# Patient Record
Sex: Male | Born: 1970 | Race: White | Hispanic: No | Marital: Married | Smoking: Former smoker
Health system: Southern US, Community
[De-identification: ages and names within clinical notes are randomized; demographics above are authoritative.]

## PROBLEM LIST (undated history)

## (undated) DIAGNOSIS — M545 Low back pain, unspecified: Secondary | ICD-10-CM

## (undated) DIAGNOSIS — K219 Gastro-esophageal reflux disease without esophagitis: Secondary | ICD-10-CM

## (undated) HISTORY — DX: Low back pain, unspecified: M54.50

## (undated) HISTORY — DX: Low back pain: M54.5

## (undated) HISTORY — PX: OTHER SURGICAL HISTORY: SHX169

## (undated) HISTORY — DX: Gastro-esophageal reflux disease without esophagitis: K21.9

---

## 2009-02-24 ENCOUNTER — Encounter (INDEPENDENT_AMBULATORY_CARE_PROVIDER_SITE_OTHER): Payer: Self-pay | Admitting: *Deleted

## 2009-03-07 ENCOUNTER — Ambulatory Visit: Payer: Self-pay | Admitting: Internal Medicine

## 2009-03-07 DIAGNOSIS — K219 Gastro-esophageal reflux disease without esophagitis: Secondary | ICD-10-CM | POA: Insufficient documentation

## 2009-03-07 DIAGNOSIS — E119 Type 2 diabetes mellitus without complications: Secondary | ICD-10-CM

## 2009-03-07 DIAGNOSIS — M545 Low back pain: Secondary | ICD-10-CM

## 2009-03-07 HISTORY — DX: Type 2 diabetes mellitus without complications: E11.9

## 2009-03-07 HISTORY — DX: Gastro-esophageal reflux disease without esophagitis: K21.9

## 2009-03-07 LAB — CONVERTED CEMR LAB
Blood Glucose, Fasting: 208 mg/dL
Glucose, Urine, Semiquant: >=1000
Nitrite: NEGATIVE
Protein, U semiquant: NEGATIVE
Specific Gravity, Urine: 1.02
Urobilinogen, UA: 0.2
WBC Urine, dipstick: NEGATIVE
WBC, UA: NONE SEEN cells/hpf (ref <3)
pH: 6.5

## 2009-03-08 ENCOUNTER — Encounter: Payer: Self-pay | Admitting: Internal Medicine

## 2009-03-13 ENCOUNTER — Telehealth (INDEPENDENT_AMBULATORY_CARE_PROVIDER_SITE_OTHER): Payer: Self-pay | Admitting: *Deleted

## 2009-03-14 LAB — CONVERTED CEMR LAB
Albumin: 4.5 g/dL (ref 3.5–5.2)
Basophils Relative: 0.7 % (ref 0.0–3.0)
CO2: 30 meq/L (ref 19–32)
Chloride: 103 meq/L (ref 96–112)
Eosinophils Absolute: 0.1 10*3/uL (ref 0.0–0.7)
Eosinophils Relative: 1.3 % (ref 0.0–5.0)
Hemoglobin: 15.9 g/dL (ref 13.0–17.0)
Hgb A1c MFr Bld: 10.1 % — ABNORMAL HIGH (ref 4.6–6.5)
MCHC: 35 g/dL (ref 30.0–36.0)
MCV: 88.3 fL (ref 78.0–100.0)
Monocytes Absolute: 0.5 10*3/uL (ref 0.1–1.0)
Neutro Abs: 5.7 10*3/uL (ref 1.4–7.7)
Potassium: 4.2 meq/L (ref 3.5–5.1)
RBC: 5.13 M/uL (ref 4.22–5.81)
Sodium: 138 meq/L (ref 135–145)
Total Protein: 7.1 g/dL (ref 6.0–8.3)
WBC: 8.3 10*3/uL (ref 4.5–10.5)

## 2009-04-18 ENCOUNTER — Ambulatory Visit: Payer: Self-pay | Admitting: Internal Medicine

## 2009-04-21 LAB — CONVERTED CEMR LAB
ALT: 27 units/L (ref 0–53)
AST: 19 units/L (ref 0–37)
Albumin: 4.3 g/dL (ref 3.5–5.2)
Alkaline Phosphatase: 46 units/L (ref 39–117)
Total Protein: 7.1 g/dL (ref 6.0–8.3)

## 2009-05-31 ENCOUNTER — Ambulatory Visit: Payer: Self-pay | Admitting: Internal Medicine

## 2009-06-05 LAB — CONVERTED CEMR LAB
BUN: 11 mg/dL (ref 6–23)
CO2: 28 meq/L (ref 19–32)
Calcium: 9.3 mg/dL (ref 8.4–10.5)
Creatinine, Ser: 0.8 mg/dL (ref 0.4–1.5)
Glucose, Bld: 182 mg/dL — ABNORMAL HIGH (ref 70–99)

## 2009-08-15 ENCOUNTER — Ambulatory Visit: Payer: Self-pay | Admitting: Internal Medicine

## 2009-08-24 LAB — CONVERTED CEMR LAB
AST: 18 units/L (ref 0–37)
Direct LDL: 95 mg/dL
Hgb A1c MFr Bld: 7.1 % — ABNORMAL HIGH (ref 4.6–6.5)

## 2009-09-14 ENCOUNTER — Encounter: Payer: Self-pay | Admitting: Internal Medicine

## 2009-09-14 LAB — HM DIABETES EYE EXAM

## 2009-09-22 ENCOUNTER — Encounter: Payer: Self-pay | Admitting: Internal Medicine

## 2009-10-12 ENCOUNTER — Telehealth (INDEPENDENT_AMBULATORY_CARE_PROVIDER_SITE_OTHER): Payer: Self-pay | Admitting: *Deleted

## 2009-10-16 ENCOUNTER — Ambulatory Visit: Payer: Self-pay | Admitting: Family

## 2009-10-27 ENCOUNTER — Encounter: Admission: RE | Admit: 2009-10-27 | Discharge: 2009-10-27 | Payer: Self-pay | Admitting: Orthopedic Surgery

## 2009-11-15 ENCOUNTER — Ambulatory Visit: Payer: Self-pay | Admitting: Internal Medicine

## 2009-11-15 LAB — CONVERTED CEMR LAB: Blood Glucose, Fingerstick: 207

## 2009-11-17 LAB — CONVERTED CEMR LAB
BUN: 9 mg/dL (ref 6–23)
CO2: 29 meq/L (ref 19–32)
Chloride: 104 meq/L (ref 96–112)
Creatinine, Ser: 0.7 mg/dL (ref 0.4–1.5)
Potassium: 4.2 meq/L (ref 3.5–5.1)

## 2010-02-13 ENCOUNTER — Ambulatory Visit: Payer: Self-pay | Admitting: Internal Medicine

## 2010-02-13 LAB — CONVERTED CEMR LAB
HDL: 38.2 mg/dL — ABNORMAL LOW (ref >39.00)
Hgb A1c MFr Bld: 6.2 % (ref 4.6–6.5)
Total CHOL/HDL Ratio: 4
Triglycerides: 184 mg/dL — ABNORMAL HIGH (ref 0.0–149.0)
VLDL: 36.8 mg/dL (ref 0.0–40.0)

## 2010-02-13 LAB — HM DIABETES FOOT EXAM

## 2010-02-14 ENCOUNTER — Telehealth: Payer: Self-pay | Admitting: Internal Medicine

## 2010-03-22 ENCOUNTER — Telehealth (INDEPENDENT_AMBULATORY_CARE_PROVIDER_SITE_OTHER): Payer: Self-pay | Admitting: *Deleted

## 2010-04-05 ENCOUNTER — Telehealth: Payer: Self-pay | Admitting: Internal Medicine

## 2010-07-31 ENCOUNTER — Telehealth (INDEPENDENT_AMBULATORY_CARE_PROVIDER_SITE_OTHER): Payer: Self-pay | Admitting: *Deleted

## 2010-08-08 ENCOUNTER — Ambulatory Visit: Payer: Self-pay | Admitting: Internal Medicine

## 2010-08-09 LAB — CONVERTED CEMR LAB
AST: 25 units/L (ref 0–37)
Albumin: 4.4 g/dL (ref 3.5–5.2)
Alkaline Phosphatase: 44 units/L (ref 39–117)
BUN: 11 mg/dL (ref 6–23)
CO2: 30 meq/L (ref 19–32)
GFR calc non Af Amer: 111.61 mL/min (ref >60)
Glucose, Bld: 122 mg/dL — ABNORMAL HIGH (ref 70–99)
Potassium: 4.1 meq/L (ref 3.5–5.1)

## 2010-09-10 ENCOUNTER — Telehealth: Payer: Self-pay | Admitting: Internal Medicine

## 2010-09-19 ENCOUNTER — Ambulatory Visit: Payer: Self-pay | Admitting: Internal Medicine

## 2010-09-21 LAB — CONVERTED CEMR LAB: Hgb A1c MFr Bld: 7.2 % — ABNORMAL HIGH (ref 4.6–6.5)

## 2010-10-12 ENCOUNTER — Ambulatory Visit
Admission: RE | Admit: 2010-10-12 | Discharge: 2010-10-12 | Payer: Self-pay | Source: Home / Self Care | Attending: Internal Medicine | Admitting: Internal Medicine

## 2010-10-12 DIAGNOSIS — K644 Residual hemorrhoidal skin tags: Secondary | ICD-10-CM

## 2010-10-12 HISTORY — DX: Residual hemorrhoidal skin tags: K64.4

## 2010-11-06 NOTE — Assessment & Plan Note (Signed)
Summary: 3 mth fu/ns/kdc      Allergies: No Known Drug Allergies   Complete Medication List: 1)  Ranitidine Hcl 150 Mg Tabs (Ranitidine hcl) .... Two times a day 2)  One Touch Ultra 2 Test Strips  .... Checks bs 1x/day dx 250.00 3)  Metformin Hcl 1000 Mg Tabs (Metformin hcl) .Marland Kitchen.. 1 by mouth two times a day

## 2010-11-06 NOTE — Letter (Signed)
Summary: Cancer Screening/[facility] Personalized Risk Profile  Cancer Screening/[facility] Personalized Risk Profile   Imported By: Lanelle Bal 02/16/2010 14:13:56  _____________________________________________________________________  External Attachment:    Type:   Image     Comment:   External Document

## 2010-11-06 NOTE — Assessment & Plan Note (Signed)
Summary: 3 mth fu/ns/kdc  Lab Visit  Laboratory Results   Blood Tests     CBG Random:: 207mg /dL    Orders Today: Fingerstick [36416] Glucose, (CBG) [82962]  Vital Signs:  Patient Profile:   40 Years Old Male CC:      rov - had coffee with splenda, cream Height:     69 inches Weight:      222.6 pounds Pulse rate:   60 / minute Pulse rhythm:   regular BP sitting:   112 / 80  (left arm) Cuff size:   large  Vitals Entered By: Shary Decamp (November 15, 2009 9:47 AM)             Is Patient Diabetic? Yes  CBG Result 207 Comments  - pt states that blood sugar has been elevated lately - states that "I fell off the bandwagon around the holidays" but states he is starting to eat better Shary Decamp  November 15, 2009 9:56 AM     Appended Document: 3 mth fu/ns/kdc     History of Present Illness: feels well diet has not been healthy for the last few weeks but he is getting back on track  Allergies: No Known Drug Allergies  Review of Systems       no nausea or vomiting occ. has  diarrhea CBGs are mostly 130 to 150, today his sugar is  200 no low blood sugar symptoms  Physical Exam  General:  alert and well-developed.   Lungs:  normal respiratory effort, no intercostal retractions, no accessory muscle use, and normal breath sounds.   Heart:  normal rate, regular rhythm, and no murmur.     Impression & Recommendations:  Problem # 1:  DIABETES MELLITUS, TYPE II (ICD-250.00) tolerating metformin well with only sporadic diarrhea encouraged to go back to a healthier diet labs His updated medication list for this problem includes:    Metformin Hcl 1000 Mg Tabs (Metformin hcl) .Marland Kitchen... 1 by mouth two times a day  Orders: Venipuncture (16109) TLB-ALT (SGPT) (84460-ALT) TLB-AST (SGOT) (84450-SGOT) TLB-BMP (Basic Metabolic Panel-BMET) (80048-METABOL) TLB-A1C / Hgb A1C (Glycohemoglobin) (83036-A1C)  Complete Medication List: 1)  Ranitidine Hcl 150 Mg Tabs  (Ranitidine hcl) .... Two times a day 2)  One Touch Ultra 2 Test Strips  .... Checks bs 1x/day dx 250.00 3)  Metformin Hcl 1000 Mg Tabs (Metformin hcl) .Marland Kitchen.. 1 by mouth two times a day  Patient Instructions: 1)  Please schedule a follow-up appointment in 3 months --fasting, physical

## 2010-11-06 NOTE — Progress Notes (Signed)
  Phone Note Outgoing Call   Summary of Call: advise patient: diabetes is well controlled cholesterol is fine, the triglycerides continue to be slightly elevated but better than before Plan: Consolidate he's diabetes medications to ACTOPLUS MET XR 15-1000 MG 1 by mouth two times a day, explaine  patient that this is extended release but we can take it twice a day and see how that works for him. continue with a healthier lifestyle and follow-up as planned Jose E. Paz MD  Feb 14, 2010 2:10 PM   Follow-up for Phone Call        discussed with pt......Marland KitchenShary Decamp  Feb 15, 2010 11:28 AM     New/Updated Medications: ACTOPLUS MET XR 15-1000 MG XR24H-TAB (PIOGLITAZONE HCL-METFORMIN HCL) one by mouth twice a day Prescriptions: ACTOPLUS MET XR 15-1000 MG XR24H-TAB (PIOGLITAZONE HCL-METFORMIN HCL) one by mouth twice a day  #60 x 3   Entered by:   Shary Decamp   Authorized by:   Nolon Rod. Paz MD   Signed by:   Shary Decamp on 02/15/2010   Method used:   Electronically to        [facility] [phone]* (retail)       [address] [place], [place]  [address]       Ph: [phone]       Fax: [phone]   RxID:   [id number]

## 2010-11-06 NOTE — Progress Notes (Signed)
Summary: call PHARMACYrefill  Phone Note Refill Request Message from:  Patient on March 22, 2010 7:58 AM  Refills Requested: Medication #1:  ACTOPLUS MET XR 15-1000 MG XR24H-TAB one by mouth twice a day. [facility] [address] [facility]ne [facility]- fax [phone] - phone [phone]  Initial call taken by: Carol Spring,  March 22, 2010 7:58 AM  Follow-up for Phone Call        rx sent on 02-15-10 #60 3 will call pharmacy when open to confirm rx received............Marland KitchenFelecia Deloach CMA  March 22, 2010 8:13 AM  spoke to pharmacy rx on file disregard request.............Marland KitchenFelecia Deloach CMA  March 22, 2010 12:48 PM

## 2010-11-06 NOTE — Progress Notes (Signed)
Summary: ACTOS(lmom 7/1)  Phone Note Call from Patient   Caller: Patient Summary of Call: PT CALLED AND WANTS TO HAVE THE ORIGINAL RX FOR ACTOS FILLED. HIS INSURANCE WILL NOT PAY FOR ACTOPLUS. PLEASE ADVISE. Initial call taken by: Lavell Islam,  April 05, 2010 2:48 PM  Follow-up for Phone Call        new prescription --Actos 30 mg  once daily  ( if patient prefers Actos 15mg  twice a day, his insurance may not pay for that) --Metformin 1000 mg twice a day Preston Weill E. Mena Lienau MD  April 06, 2010 7:54 AM   Additional Follow-up for Phone Call Additional follow up Details #1::        Left message for pt to call back. Army Fossa CMA  April 06, 2010 8:32 AM     New/Updated Medications: ACTOS 30 MG TABS (PIOGLITAZONE HCL) 1 by mouth once daily. METFORMIN HCL 1000 MG TABS (METFORMIN HCL) 1 by mouth two times a day. Prescriptions: METFORMIN HCL 1000 MG TABS (METFORMIN HCL) 1 by mouth two times a day.  #60 x 1   Entered by:   Army Fossa CMA   Authorized by:   Nolon Rod. Charnise Lovan MD   Signed by:   Army Fossa CMA on 04/06/2010   Method used:   Electronically to        [facility] [phone]* (retail)       [address] [place], [place]  [address]       Ph: [phone]       Fax: [phone]   RxID:   [id number] ACTOS 30 MG TABS (PIOGLITAZONE HCL) 1 by mouth once daily.  #30 x 1   Entered by:   Army Fossa CMA   Authorized by:   Nolon Rod. Dameon Soltis MD   Signed by:   Army Fossa CMA on 04/06/2010   Method used:   Electronically to        [facility] [phone]* (retail)       [address] [place], [place]  [address]       Ph: [phone]       Fax: [phone]   RxID:   [id number]

## 2010-11-06 NOTE — Assessment & Plan Note (Signed)
Summary: pain right elbow/tendonitis?/alr   Vital Signs:  Patient profile:   40 year old male Weight:      222 pounds Pulse rate:   70 / minute BP sitting:   130 / 76  (left arm)  Vitals Entered By: Doristine Devoid (October 16, 2009 9:53 AM) CC: pain in R elbow now discomfort in L Comments -has been using brace w/ some relief    CC:  pain in R elbow now discomfort in L.  History of Present Illness: Mr Sobol is a 40 year old male who presents today with c/o pain in right elbow x several months.  Has used ibuprofen and braces without significant improvement.  Works on a [employer]- notes that symptoms worsen during the week.  Better on the weekends.  Describes pain as a dull ache.   Now starting to have pain in the left elbow- but not as severe as the right.  Patient is right handed.  Allergies: No Known Drug Allergies  Review of Systems       Denies fever, associated elbow redness, notes mild Right elbow swelling.    Physical Exam  General:  Well-developed,well-nourished,in no acute distress; alert,appropriate and cooperative throughout examination Msk:  No swelling or erythema noted over olecranon processes bilaterally. No nodules. + bilateral flexion and extension. Psych:  Oriented X3, normally interactive, and good eye contact.     Impression & Recommendations:  Problem # 1:  OLECRANON BURSITIS (ICD-726.33) Assessment New Patient has tried braces and NAIDS without improvement.  Will refer to orthopedics for further evaluation. Continue as needed ibuprofen Orders: Orthopedic Referral (Ortho)  Complete Medication List: 1)  Ranitidine Hcl 150 Mg Tabs (Ranitidine hcl) .... Two times a day 2)  One Touch Ultra 2 Test Strips  .... Checks bs 1x/day dx 250.00 3)  Metformin Hcl 1000 Mg Tabs (Metformin hcl) .Marland Kitchen.. 1 by mouth two times a day  Patient Instructions: 1)  Take 400-600mg  of Ibuprofen (Advil, Motrin) with food every 4-6 hours as needed for relief of pain or comfort of  fever. 2)  You will be contacted about your appointment with orthopedics

## 2010-11-06 NOTE — Assessment & Plan Note (Signed)
Summary: CPX/KDC   Vital Signs:  Patient profile:   40 year old male Height:      70.5 inches Weight:      222.6 pounds BMI:     31.60 Pulse rate:   57 / minute Pulse rhythm:   regular BP sitting:   137 / 88  Vitals Entered By: Shary Decamp (Feb 13, 2010 8:27 AM) CC: cpx, fasting, FBS 80-105 Is Patient Diabetic? Yes   History of Present Illness: CPX feels well  Preventive Screening-Counseling & Management  Alcohol-Tobacco     Year Quit: 2001  Caffeine-Diet-Exercise     Caffeine use/day: 0     Type of exercise: pt works in garden  Current Medications (verified): 1)  Ranitidine Hcl 150 Mg Tabs (Ranitidine Hcl) .... Two Times A Day 2)  One Touch Ultra 2 Test Strips .... Checks Bs 1x/day Dx 250.00 3)  Metformin Hcl 1000 Mg Tabs (Metformin Hcl) .Marland Kitchen.. 1 By Mouth Two Times A Day 4)  Actos 30 Mg Tabs (Pioglitazone Hcl) .Marland Kitchen.. 1 By Mouth Once Daily - Due Office Visit 02/2010 5)  Allegra 180 Mg Tabs (Fexofenadine Hcl) .Marland Kitchen.. 1 Daily  Allergies (verified): No Known Drug Allergies  Past History:  Past Medical History: Reviewed history from 05/31/2009 and no changes required. GERD Low back pain (sees chiro [facility] Picket) Diabetes mellitus, type II  Dx 03-2009  Past Surgical History: Reviewed history from 03/07/2009 and no changes required. 2007 - abscess anal gland  Family History: Reviewed history from 03/07/2009 and no changes required. DM - F, Bro High chol - F HTN - F stroke - no colon Ca - No (M had colon polyps) prostate Ca - no CAD - no  Social History: Married no kids Former Smoker quit 2000 Alcohol use-yes- glass of wine occasionally Drug use-no Regular exercise-yes (wii fit, walks a lot @ work, works outside on weekends) diet-- trying to eat healthy Caffeine use/day:  0  Review of Systems General:  Denies fatigue and weight loss. CV:  Denies chest pain or discomfort and swelling of feet. Resp:  Denies cough and shortness of breath. GI:  Denies bloody  stools, nausea, and vomiting. GU:  Denies hematuria, urinary frequency, and urinary hesitancy. Endo:  ambulatory CBGs fasting 80 to 105 .  Physical Exam  General:  alert, well-developed, and well-nourished.   Neck:  no thyromegaly.   Lungs:  normal respiratory effort, no intercostal retractions, no accessory muscle use, and normal breath sounds.   Heart:  normal rate, regular rhythm, and no murmur.   Abdomen:  soft, non-tender, no distention, no masses, no guarding, and no rigidity.   Pulses:  normal pedal pulses bilaterally  Extremities:  no pretibial edema bilaterally  Psych:  Cognition and judgment appear intact. Alert and cooperative with normal attention span and concentration.  not anxious appearing and not depressed appearing.    Diabetes Management Exam:    Foot Exam (with socks and/or shoes not present):       Sensory-Pinprick/Light touch:          Left medial foot (L-4): normal          Left dorsal foot (L-5): normal          Left lateral foot (S-1): normal          Right medial foot (L-4): normal          Right dorsal foot (L-5): normal          Right lateral foot (S-1): normal  Sensory-Monofilament:          Left foot: normal          Right foot: normal       Inspection:          Left foot: normal          Right foot: normal       Nails:          Left foot: normal          Right foot: normal   Impression & Recommendations:  Problem # 1:  ROUTINE GENERAL MEDICAL EXAM@HEALTH  CARE FACL (ICD-V70.0)  Td 08 sees his dentist routinely labs discuss diet and exercise  Orders: Venipuncture (82956) TLB-Lipid Panel (80061-LIPID)  Problem # 2:  DIABETES MELLITUS, TYPE II (ICD-250.00)  seems to be doing well does have his eyes checked routinely if A1c good, will consolidate metformin and Actos in a single tablet His updated medication list for this problem includes:    Metformin Hcl 1000 Mg Tabs (Metformin hcl) .Marland Kitchen... 1 by mouth two times a day    Actos 30 Mg  Tabs (Pioglitazone hcl) .Marland Kitchen... 1 by mouth once daily - due office visit 02/2010  Orders: TLB-A1C / Hgb A1C (Glycohemoglobin) (83036-A1C) TLB-Microalbumin/Creat Ratio, Urine (82043-MALB)  Complete Medication List: 1)  Ranitidine Hcl 150 Mg Tabs (Ranitidine hcl) .... Two times a day 2)  One Touch Ultra 2 Test Strips  .... Checks bs 1x/day dx 250.00 3)  Metformin Hcl 1000 Mg Tabs (Metformin hcl) .Marland Kitchen.. 1 by mouth two times a day 4)  Actos 30 Mg Tabs (Pioglitazone hcl) .Marland Kitchen.. 1 by mouth once daily - due office visit 02/2010 5)  Allegra 180 Mg Tabs (Fexofenadine hcl) .Marland Kitchen.. 1 daily  Patient Instructions: 1)  Please schedule a follow-up appointment in 4 to 6 months depending on lab results   Preventive Care Screening  Prior Values:    Last Tetanus Booster:  Historical (10/07/2006)    Risk Factors:  Tobacco use:  quit    Year quit:  2001 Drug use:  no Caffeine use:  0 drinks per day Alcohol use:  yes    Has patient --       Needed eye opener in the morning:  yes    Comments:  1x/month Exercise:  yes    Type:  pt works in garden

## 2010-11-06 NOTE — Assessment & Plan Note (Signed)
Summary: office visit with fasting labs///sph   Vital Signs:  Patient profile:   40 year old male Weight:      233.38 pounds Pulse rate:   76 / minute Pulse rhythm:   regular BP sitting:   126 / 82  (left arm) Cuff size:   large  Vitals Entered By: Army Fossa CMA (August 08, 2010 8:50 AM) CC: Follow up visit- fasting Comments [facility] act[facility] and [facility]   History of Present Illness: here for followup on diabetes Would like to try Byetta Uncomfortable taking Actos any longer d/t legal problems he saw in TV Metformin causes stomach discomfort and some diarrhea  Current Medications (verified): 1)  Ranitidine Hcl 150 Mg Tabs (Ranitidine Hcl) .... Two Times A Day 2)  One Touch Ultra 2 Test Strips .... Checks Bs 1x/day Dx 250.00 3)  Allegra 180 Mg Tabs (Fexofenadine Hcl) .Marland Kitchen.. 1 Daily 4)  Actos 30 Mg Tabs (Pioglitazone Hcl) .Marland Kitchen.. 1 By Mouth Once Daily. 5)  Metformin Hcl 1000 Mg Tabs (Metformin Hcl) .Marland Kitchen.. 1 By Mouth Two Times A Day. 6)  Aleve 220 Mg Tabs (Naproxen Sodium) .... As Needed  Allergies (verified): No Known Drug Allergies  Past History:  Past Medical History: Reviewed history from 05/31/2009 and no changes required. GERD Low back pain (sees chiro [facility] Picket) Diabetes mellitus, type II  Dx 03-2009  Past Surgical History: Reviewed history from 03/07/2009 and no changes required. 2007 - abscess anal gland  Social History: Reviewed history from 02/13/2010 and no changes required. Married no kids Former Smoker quit 2000 Alcohol use-yes- glass of wine occasionally Drug use-no Regular exercise-yes (wii fit, walks a lot @ work, works outside on weekends) diet-- trying to eat healthy   Review of Systems       weight gain since he started Actos No chest pain or shortness of breath  Physical Exam  General:  alert and well-developed.   Lungs:  normal respiratory effort, no intercostal retractions, no accessory muscle  use, and normal breath sounds.   Heart:  normal rate, regular rhythm, and no murmur.   Extremities:  no edema   Impression & Recommendations:  Problem # 1:  DIABETES MELLITUS, TYPE II (ICD-250.00) would like to discontinue Actos and metformin and try Byetta, see HPI I don't oppose that Information regards Byetta  provided from "up-to-date" we showed him how to use it   The following medications were removed from the medication list:    Actos 30 Mg Tabs (Pioglitazone hcl) .Marland Kitchen... 1 by mouth once daily.    Metformin Hcl 1000 Mg Tabs (Metformin hcl) .Marland Kitchen... 1 by mouth two times a day. His updated medication list for this problem includes:    Byetta 5 Mcg Pen 5 Mcg/0.29ml Soln (Exenatide) ..... One injection twice a day 30 to 60 minutes  orbefore meals  Orders: Venipuncture (01751) TLB-BMP (Basic Metabolic Panel-BMET) (80048-METABOL) TLB-Hepatic/Liver Function Pnl (80076-HEPATIC) TLB-A1C / Hgb A1C (Glycohemoglobin) (83036-A1C) Specimen Handling (02585)  Complete Medication List: 1)  Ranitidine Hcl 150 Mg Tabs (Ranitidine hcl) .... Two times a day 2)  One Touch Ultra 2 Test Strips  .... Checks bs 1x/day dx 250.00 3)  Allegra 180 Mg Tabs (Fexofenadine hcl) .Marland Kitchen.. 1 daily 4)  Aleve 220 Mg Tabs (Naproxen sodium) .... As needed 5)  Byetta 5 Mcg Pen 5 Mcg/0.44ml Soln (Exenatide) .... One injection twice a day 30 to 60 minutes  orbefore meals  Patient Instructions: 1)  start byetta  twice a  day 2)  Please read the information about this drug 3)  Watch for side effects 4)  call with your blood sugar results in 2 weeks, check sugars twice a day 5)  office visit in 6 weeks 6)  diet! 7)  Exercise! Prescriptions: BYETTA 5 MCG PEN 5 MCG/0.02ML SOLN (EXENATIDE) one injection twice a day 30 to 60 minutes  orbefore meals  #one month x 3   Entered and Authorized by:   Elita Quick E. Paz MD   Signed by:   Nolon Rod. Paz MD on 08/08/2010   Method used:   Print then Give to Patient   RxID:    [id number]    Orders Added: 1)  Venipuncture [95621] 2)  TLB-BMP (Basic Metabolic Panel-BMET) [80048-METABOL] 3)  TLB-Hepatic/Liver Function Pnl [80076-HEPATIC] 4)  TLB-A1C / Hgb A1C (Glycohemoglobin) [83036-A1C] 5)  Specimen Handling [99000] 6)  Est. Patient Level III [30865]

## 2010-11-06 NOTE — Progress Notes (Signed)
Summary: appt--LMOM 10/25--appt sched  Phone Note Outgoing Call   Call placed by: Army Fossa CMA,  July 31, 2010 8:16 AM Summary of Call: Pt is due for a fasting OV w/ Dr Drue Novel. Please schedule.   Follow-up for Phone Call        [facility] ON HOME NUMBER TO CALL AND SCHEDULE A FASTING OFFICE VISIT WITH DR PAZ Follow-up by: Jerolyn Shin,  July 31, 2010 12:18 PM  Additional Follow-up for Phone Call Additional follow up Details #1::        appt acheduled 11/2 at 8:40am Additional Follow-up by: Jerolyn Shin,  August 01, 2010 9:31 AM

## 2010-11-06 NOTE — Progress Notes (Signed)
Summary: left msg for pt to call  Phone Note Call from Patient Call back at [facility] Phone [phone]   Summary of Call: REQUESTING CALL BACK ABOUT ELBOW  Initial call taken by: Shary Decamp,  October 12, 2009 2:17 PM  Follow-up for Phone Call        Called pt got VM, left msg for pt to call .Kandice Hams  October 12, 2009 2:42 PM pt called back c/o pain right elbow,tendonitis x 1 month using tennisi elbow cuff which helps some, but pain getting worse .Kandice Hams  October 12, 2009 2:50 PM   Follow-up by: Kandice Hams,  October 12, 2009 2:50 PM

## 2010-11-06 NOTE — Progress Notes (Signed)
Summary: CBG reading/Byetta  Phone Note Call from Patient Call back at Home Phone [phone]   Details for Reason: *Will need Byetta prescription. Summary of Call: Pt called noting that his cbg reading have been 120-145 in the AM. He would like to know is it ok to change to of Byetta? Please advise. Initial call taken by: Lucious Groves CMA,  September 10, 2010 10:04 AM  Follow-up for Phone Call        Patient called again regarding the above. Lucious Groves CMA  September 10, 2010 2:44 PM   okay to increased to 5 mg b.i.d., watch for hypoglycemia Jose E. Paz MD  September 10, 2010 4:59 PM    Additional Follow-up for Phone Call Additional follow up Details #1::        left message on voicemail to call back to office. Lucious Groves CMA  September 10, 2010 5:01 PM   Patient notified and states that he is doing the 5mg  two times a day, he needs to know if he can step up 10mg  pen. Please advise. Lucious Groves CMA  September 12, 2010 11:27 AM     Additional Follow-up for Phone Call Additional follow up Details #2::    I prefer  to wait and see how his next hemoglobin A1c is. Jose E. Paz MD  September 12, 2010 1:07 PM   Additional Follow-up for Phone Call Additional follow up Details #3:: Details for Additional Follow-up Action Taken: Patient notified. Lucious Groves CMA  September 12, 2010 3:44 PM

## 2010-11-08 NOTE — Assessment & Plan Note (Signed)
Summary: rto 6 weeks/cbs   Vital Signs:  Patient profile:   40 year old male Weight:      231.25 pounds Pulse rate:   62 / minute Pulse rhythm:   regular BP sitting:   130 / 76  (left arm) Cuff size:   large  Vitals Entered By: Army Fossa CMA (September 19, 2010 9:32 AM) CC: 6 week f/u on Byetta- Fasting  Comments [facility]    History of Present Illness: started byetta  approximately 6 weeks ago  feeling well blood sugars in the morning before breakfast are running 120, 140 Before dinner are around 95  ROS No nausea dizziness No dyspepsia No fatigue No symptoms consistent with low blood sugars  Current Medications (verified): 1)  Ranitidine Hcl 150 Mg Tabs (Ranitidine Hcl) .... Two Times A Day 2)  One Touch Ultra 2 Test Strips .... Checks Bs 1x/day Dx 250.00 3)  Allegra 180 Mg Tabs (Fexofenadine Hcl) .Marland Kitchen.. 1 Daily 4)  Aleve 220 Mg Tabs (Naproxen Sodium) .... As Needed 5)  Byetta 5 Mcg Pen 5 Mcg/0.56ml Soln (Exenatide) .... One Injection Twice A Day 30 To 60 Minutes  Orbefore Meals 6)  Bd Pen Needle Short U/f 31g X 8 Mm Misc (Insulin Pen Needle) .... Two Times A Day  Allergies (verified): No Known Drug Allergies  Past History:  Past Medical History: Reviewed history from 05/31/2009 and no changes required. GERD Low back pain (sees chiro Chase Picket) Diabetes mellitus, type II  Dx 03-2009  Past Surgical History: Reviewed history from 03/07/2009 and no changes required. 2007 - abscess anal gland  Social History: Reviewed history from 02/13/2010 and no changes required. Married no kids Former Smoker quit 2000 Alcohol use-yes- glass of wine occasionally Drug use-no Regular exercise-yes (wii fit, walks a lot @ work, works outside on weekends) diet-- trying to eat healthy   Physical Exam  General:  alert and well-developed.   Lungs:  normal respiratory effort, no intercostal retractions, no accessory muscle use, and normal breath sounds.   Heart:   normal rate, regular rhythm, and no murmur.   Extremities:  no edema   Impression & Recommendations:  Problem # 1:  DIABETES MELLITUS, TYPE II (ICD-250.00) on Byetta for 6 weeks, no side effects is little early to check the A1c but will do it, consider increase Byetta to 10 mg b.i.d. Encourage healthy diet and active even during the winter time. Also patient plans to get his flu shot at the pharmacy. His updated medication list for this problem includes:    Byetta 5 Mcg Pen 5 Mcg/0.57ml Soln (Exenatide) ..... One injection twice a day 30 to 60 minutes  orbefore meals  Complete Medication List: 1)  Ranitidine Hcl 150 Mg Tabs (Ranitidine hcl) .... Two times a day 2)  One Touch Ultra 2 Test Strips  .... Checks bs 1x/day dx 250.00 3)  Allegra 180 Mg Tabs (Fexofenadine hcl) .Marland Kitchen.. 1 daily 4)  Aleve 220 Mg Tabs (Naproxen sodium) .... As needed 5)  Byetta 5 Mcg Pen 5 Mcg/0.35ml Soln (Exenatide) .... One injection twice a day 30 to 60 minutes  orbefore meals  Other Orders: Venipuncture (16109) TLB-A1C / Hgb A1C (Glycohemoglobin) (83036-A1C) TLB-ALT (SGPT) (84460-ALT) TLB-AST (SGOT) (84450-SGOT) Specimen Handling (60454)  Patient Instructions: 1)  Please schedule a follow-up appointment in 3 months .  Prescriptions: BD PEN NEEDLE SHORT U/F 31G X 8 MM MISC (INSULIN PEN NEEDLE) two times a day  #1 month x 3   Entered by:  Army Fossa CMA   Authorized by:   Nolon Rod. Holy Battenfield MD   Signed by:   Army Fossa CMA on 09/19/2010   Method used:   Electronically to        [facility] [phone]* (retail)       [address] [place], [place]  [address]       Ph: [phone]       Fax: [phone]   RxID:   [id number]    Orders Added: 1)  Venipuncture [36415] 2)  TLB-A1C / Hgb A1C (Glycohemoglobin) [83036-A1C] 3)  TLB-ALT (SGPT) [84460-ALT] 4)  TLB-AST (SGOT) [84450-SGOT] 5)  Specimen Handling [99000] 6)  Est. Patient Level III [24401]

## 2010-11-08 NOTE — Assessment & Plan Note (Signed)
Summary: hemorrhoid?//PH   Vital Signs:  Patient profile:   40 year old male Weight:      230.38 pounds Pulse rate:   84 / minute Pulse rhythm:   regular BP sitting:   132 / 80  (left arm) Cuff size:   large  Vitals Entered By: Army Fossa CMA (October 12, 2010 11:19 AM) CC: Pt here to discuss hemorrhoid- not fasting  [facility]     History of Present Illness: 5 weeks ago, developed burning and itching in the anal area He occasionally sees some blood in the toilet paper if he wipes There is an area there that is enlarged and irritated, patient's thinks is a hemorrhoid No history of previous hemorrhoids but 4 years ago he had   a perianal abscess, status post I&D ----> "this is different" nupercainal over-the-counter helps to some extent ROS No nausea, vomiting, abdominal pain Stools are normal color  Current Medications (verified): 1)  Ranitidine Hcl 150 Mg Tabs (Ranitidine Hcl) .... Two Times A Day 2)  One Touch Ultra 2 Test Strips .... Checks Bs 1x/day Dx 250.00 3)  Allegra 180 Mg Tabs (Fexofenadine Hcl) .Marland Kitchen.. 1 Daily 4)  Aleve 220 Mg Tabs (Naproxen Sodium) .... As Needed 5)  Byetta 10 Mcg Pen 10 Mcg/0.76ml Soln (Exenatide) .... One Injection Twice A Day. 30-60 Minutes Before Meals.  Allergies (verified): No Known Drug Allergies  Past History:  Past Medical History: Reviewed history from 05/31/2009 and no changes required. GERD Low back pain (sees chiro Chase Picket) Diabetes mellitus, type II  Dx 03-2009  Past Surgical History: 2007 - abscess anal gland, status post I&D  Physical Exam  General:  alert and well-developed.   Rectal:  at 6 oclock  he has a 1 cm small slightly tender hemorrhoid versus irritated skin tag . Normal sphincter tone. No rectal masses or tenderness; stools are  normal. palpation of the perianal area show no fluctuancy  or redness Anoscopy-- several small internal hemorrhoids  without evidence of recent bleed Prostate:   Prostate gland firm and smooth, no enlargement, nodularity, tenderness, mass, asymmetry or induration.   Impression & Recommendations:  Problem # 1:  EXTERNAL HEMORRHOIDS (ICD-455.3)  has a small slightly irritated external hemorrhoid vs skin tag has  internal hemorrhoids but apparently they are  not causing the problems Plan, see instructions  Orders: Anoscopy (81191)  Complete Medication List: 1)  Ranitidine Hcl 150 Mg Tabs (Ranitidine hcl) .... Two times a day 2)  One Touch Ultra 2 Test Strips  .... Checks bs 1x/day dx 250.00 3)  Allegra 180 Mg Tabs (Fexofenadine hcl) .Marland Kitchen.. 1 daily 4)  Aleve 220 Mg Tabs (Naproxen sodium) .... As needed 5)  Byetta 10 Mcg Pen 10 Mcg/0.58ml Soln (Exenatide) .... One injection twice a day. 30-60 minutes before meals. 6)  Hydrocortisone 2.5 % Oint (Hydrocortisone) .... Apply twice a day for 10 days  Other Orders: Flu Vaccine 63yrs + (47829) Admin 1st Vaccine (56213)  Patient Instructions: 1)  warm sitz baths 2)  Hydrocodone twice a day 3)  nupercainal  as needed 4)  Colace 100 mg one daily for a month 5)  if the problem become severe or comes  back ---- call for a referral Prescriptions: HYDROCORTISONE 2.5 % OINT (HYDROCORTISONE) apply twice a day for 10 days  #1 x 1   Entered and Authorized by:   Elita Quick E. Abid Bolla MD   Signed by:   Nolon Rod. Ollen Rao MD on 10/12/2010   Method  used:   Electronically to        [facility] [phone]* (retail)       [address] [place], [place]  [address]       Ph: [phone]       Fax: [phone]   RxID:   [id number]    Orders Added: 1)  Flu Vaccine 73yrs + [02725] 2)  Admin 1st Vaccine [90471] 3)  Est. Patient Level III [36644] 4)  Anoscopy [46600]   Immunizations Administered:  Influenza Vaccine # 1:    Vaccine Type: Fluvax 3+    Site: right deltoid    Mfr: Sanofi Pasteur    Dose: 0.5 ml    Route: IM    Given by: Army Fossa CMA    Exp. Date: 04/06/2011    Lot #:  [id number]   Immunizations Administered:  Influenza Vaccine # 1:    Vaccine Type: Fluvax 3+    Site: right deltoid    Mfr: Sanofi Pasteur    Dose: 0.5 ml    Route: IM    Given by: Army Fossa CMA    Exp. Date: 04/06/2011    Lot #: [id number]

## 2010-12-20 ENCOUNTER — Ambulatory Visit (INDEPENDENT_AMBULATORY_CARE_PROVIDER_SITE_OTHER): Payer: 59 | Admitting: Internal Medicine

## 2010-12-20 ENCOUNTER — Other Ambulatory Visit: Payer: Self-pay | Admitting: Internal Medicine

## 2010-12-20 ENCOUNTER — Encounter: Payer: Self-pay | Admitting: Internal Medicine

## 2010-12-20 DIAGNOSIS — E119 Type 2 diabetes mellitus without complications: Secondary | ICD-10-CM

## 2010-12-20 LAB — LIPID PANEL
Cholesterol: 147 mg/dL (ref 0–200)
LDL Cholesterol: 83 mg/dL (ref 0–99)
Total CHOL/HDL Ratio: 4
VLDL: 31 mg/dL (ref 0.0–40.0)

## 2010-12-21 LAB — BASIC METABOLIC PANEL
BUN: 11 mg/dL (ref 6–23)
CO2: 27 mEq/L (ref 19–32)
GFR: 123.48 mL/min (ref >60.00)
Glucose, Bld: 149 mg/dL — ABNORMAL HIGH (ref 70–99)
Potassium: 4.4 mEq/L (ref 3.5–5.1)

## 2010-12-25 NOTE — Assessment & Plan Note (Signed)
Summary: 3 MONTH ROV/CBS   Vital Signs:  Patient profile:   40 year old male Height:      70.5 inches Weight:      218.38 pounds BMI:     31.00 Pulse rate:   64 / minute Pulse rhythm:   regular BP sitting:   126 / 80  (left arm) Cuff size:   large  Vitals Entered By: Army Fossa CMA (December 20, 2010 8:20 AM) CC: 3 Month f/u- fasting  Comments [facility]    History of Present Illness:  followup on diabetes, feels well,  has loss weight he thinks because his appetite has decreased  Review of systems Ambulatory CBGs around 100  exercise is about the same, he walks from time to time  continue with elbow pain bilaterally, 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 His orthopedic surgeon is considering surgery   no symptoms consistent with low sugars   Current Medications (verified): 1)  Ranitidine Hcl 150 Mg Tabs (Ranitidine Hcl) .... Two Times A Day 2)  One Touch Ultra 2 Test Strips .... Checks Bs 1x/day Dx 250.00 3)  Allegra 180 Mg Tabs (Fexofenadine Hcl) .Marland Kitchen.. 1 Daily 4)  Byetta 10 Mcg Pen 10 Mcg/0.27ml Soln (Exenatide) .... One Injection Twice A Day. 30-60 Minutes Before Meals. 5)  Hydrocortisone 2.5 % Oint (Hydrocortisone) .... Apply Twice A Day For 10 Days  Allergies (verified): No Known Drug Allergies  Past History:  Past Medical History: Reviewed history from 05/31/2009 and no changes required. GERD Low back pain (sees chiro Chase Picket) Diabetes mellitus, type II  Dx 03-2009  Past Surgical History: Reviewed history from 10/12/2010 and no changes required. 2007 - abscess anal gland, status post I&D  Physical Exam  General:  alert, well-developed, and well-nourished.   Lungs:  normal respiratory effort, no intercostal retractions, no accessory muscle use, and normal breath sounds.   Heart:  normal rate, regular rhythm, and no murmur.   Extremities:  no edema   Impression & Recommendations:  Problem # 1:  DIABETES MELLITUS, TYPE II  (ICD-250.00)  doing great Labs  His updated medication list for this problem includes:    Byetta 10 Mcg Pen 10 Mcg/0.10ml Soln (Exenatide) ..... One injection twice a day. 30-60 minutes before meals.  Orders: Venipuncture (16109) TLB-A1C / Hgb A1C (Glycohemoglobin) (83036-A1C) TLB-Lipid Panel (80061-LIPID) TLB-TSH (Thyroid Stimulating Hormone) (84443-TSH) Specimen Handling (60454)  Complete Medication List: 1)  Ranitidine Hcl 150 Mg Tabs (Ranitidine hcl) .... Two times a day 2)  One Touch Ultra 2 Test Strips  .... Checks bs 1x/day dx 250.00 3)  Allegra 180 Mg Tabs (Fexofenadine hcl) .Marland Kitchen.. 1 daily 4)  Byetta 10 Mcg Pen 10 Mcg/0.12ml Soln (Exenatide) .... One injection twice a day. 30-60 minutes before meals. 5)  Hydrocortisone 2.5 % Oint (Hydrocortisone) .... Apply twice a day for 10 days  Patient Instructions: 1)  Please schedule a follow-up appointment in 3 months . Phhysical exam    Orders Added: 1)  Venipuncture [36415] 2)  TLB-A1C / Hgb A1C (Glycohemoglobin) [83036-A1C] 3)  TLB-Lipid Panel [80061-LIPID] 4)  TLB-TSH (Thyroid Stimulating Hormone) [84443-TSH] 5)  Specimen Handling [99000] 6)  Est. Patient Level III [09811]

## 2011-02-16 ENCOUNTER — Other Ambulatory Visit: Payer: Self-pay | Admitting: Internal Medicine

## 2011-03-20 ENCOUNTER — Encounter: Payer: Self-pay | Admitting: Internal Medicine

## 2011-03-22 ENCOUNTER — Encounter: Payer: Self-pay | Admitting: Internal Medicine

## 2011-03-22 ENCOUNTER — Ambulatory Visit (INDEPENDENT_AMBULATORY_CARE_PROVIDER_SITE_OTHER): Payer: 59 | Admitting: Internal Medicine

## 2011-03-22 VITALS — BP 128/80 | HR 53 | Wt 208.8 lb

## 2011-03-22 DIAGNOSIS — E119 Type 2 diabetes mellitus without complications: Secondary | ICD-10-CM

## 2011-03-22 NOTE — Assessment & Plan Note (Addendum)
Off  Metformin due to s/e Doing great w/ diet, exercising more Labs

## 2011-03-22 NOTE — Progress Notes (Signed)
  Subjective:    Patient ID: Bradley Jefferson, male    DOB: 1971-04-11, 40 y.o.   MRN: [id number]  HPI Routine office visit, based on the last hemoglobin A1c, metformin was restarted. He had to discontinue it due to GI side effects. He decided  to change his diet, eating a lot healthier, exercising a little more. Has lost 10 pounds  Past Medical History  Diagnosis Date  . GERD (gastroesophageal reflux disease)   . Low back pain     sees Chiro-[facility]man  . Diabetes mellitus 03/2009   Past Surgical History  Procedure Date  . Abscess anal gland     s/p I&D     Review of Systems No chest pain or shortness of breath, no nausea, vomiting or diarrhea (except when he took metformin)    Objective:   Physical Exam Alert, oriented, no apparent distress. Lungs are clear to auscultation bilaterally. Cardiovascular regular rate and rhythm without a murmur. No lower extremity edema       Assessment & Plan:

## 2011-04-21 ENCOUNTER — Other Ambulatory Visit: Payer: Self-pay | Admitting: Internal Medicine

## 2011-07-21 IMAGING — CR DG ELBOW 2V*R*
2 series · 2 of 2 positions shown · non-contrast
Comparison: None

CLINICAL DATA: Elbow pain, no acute trauma

RIGHT ELBOW - 2 VIEW

[view not recorded (1 of 2)]
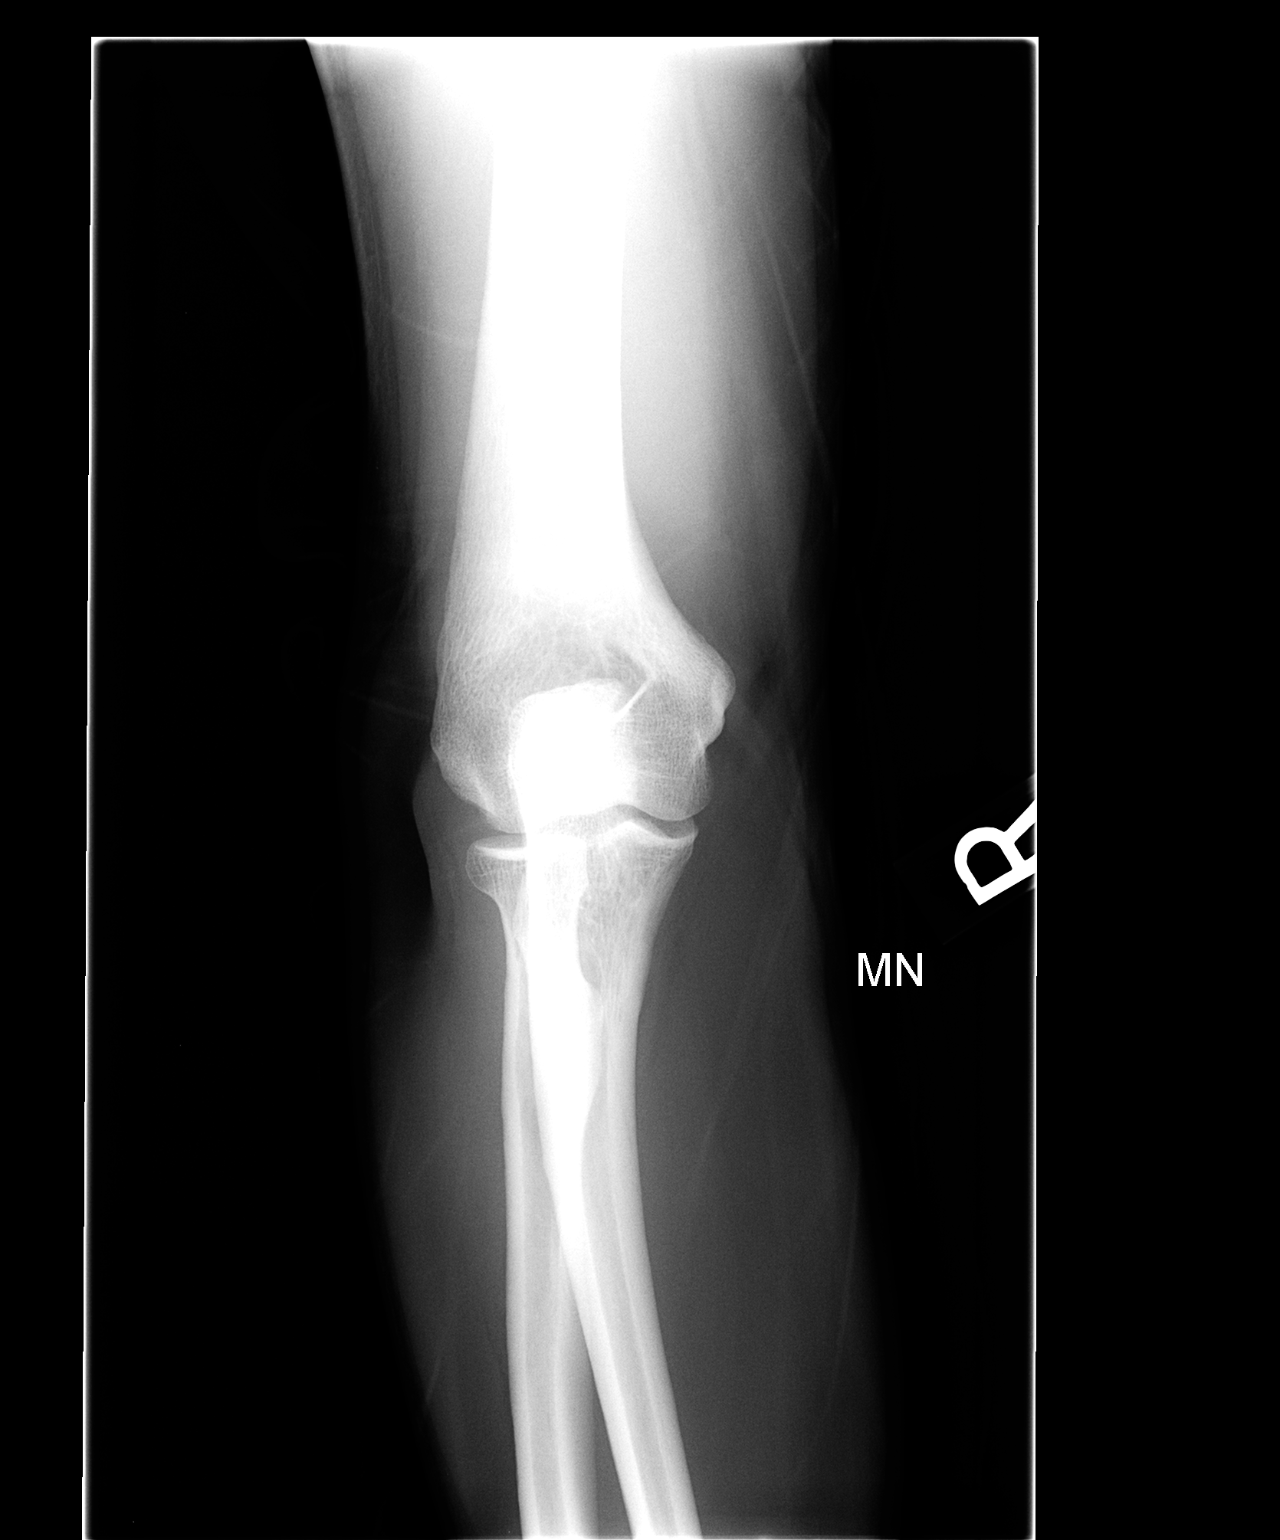

[view not recorded (2 of 2)]
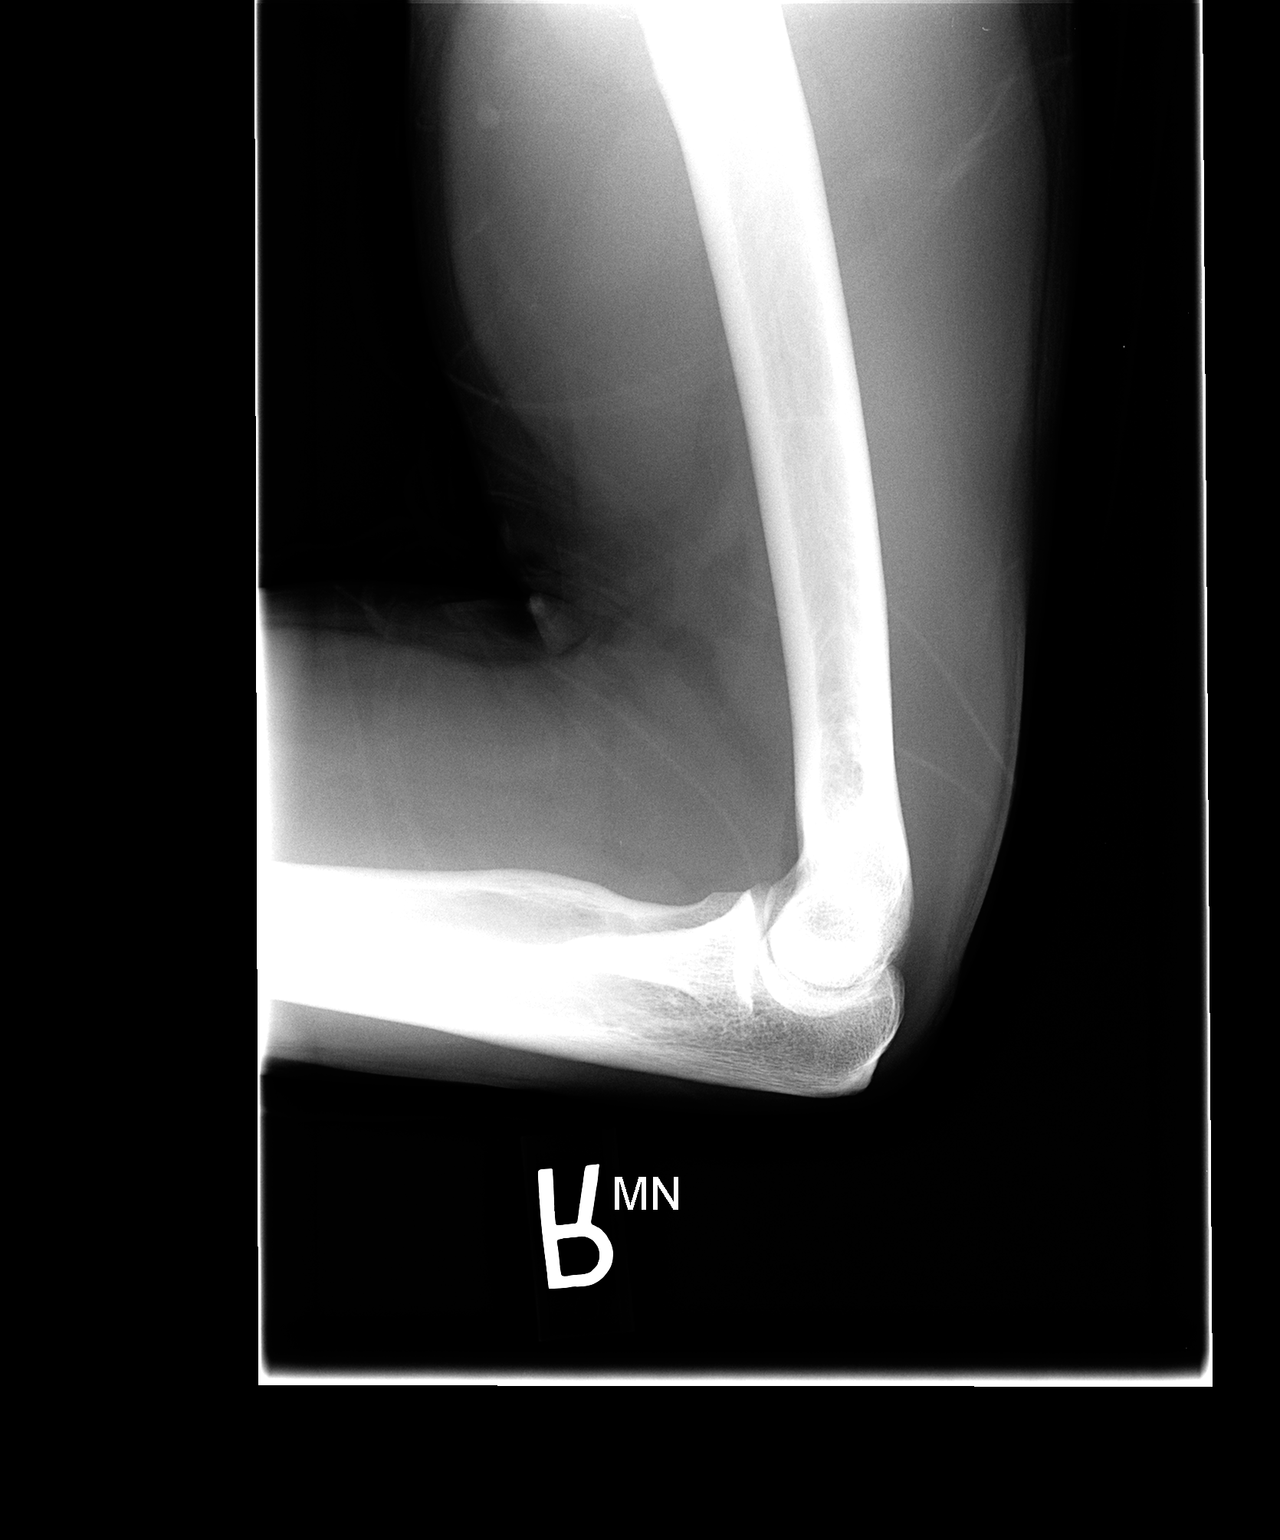

[2 of 2 positions shown; findings below may reference images not displayed]

FINDINGS: Joint spaces are normal.  No fracture or effusion is
seen.  Alignment is normal.
IMPRESSION: Negative right elbow.

## 2011-07-22 ENCOUNTER — Ambulatory Visit (INDEPENDENT_AMBULATORY_CARE_PROVIDER_SITE_OTHER): Payer: 59 | Admitting: Internal Medicine

## 2011-07-22 ENCOUNTER — Encounter: Payer: Self-pay | Admitting: Internal Medicine

## 2011-07-22 DIAGNOSIS — Z136 Encounter for screening for cardiovascular disorders: Secondary | ICD-10-CM

## 2011-07-22 DIAGNOSIS — Z0000 Encounter for general adult medical examination without abnormal findings: Secondary | ICD-10-CM

## 2011-07-22 DIAGNOSIS — E119 Type 2 diabetes mellitus without complications: Secondary | ICD-10-CM

## 2011-07-22 HISTORY — DX: Encounter for general adult medical examination without abnormal findings: Z00.00

## 2011-07-22 LAB — MICROALBUMIN / CREATININE URINE RATIO
Microalb Creat Ratio: 0.7 mg/g (ref 0.0–30.0)
Microalb, Ur: 0.3 mg/dL (ref 0.0–1.9)

## 2011-07-22 LAB — CBC WITH DIFFERENTIAL/PLATELET
Basophils Absolute: 0 10*3/uL (ref 0.0–0.1)
Basophils Relative: 0.2 % (ref 0.0–3.0)
Eosinophils Absolute: 0.2 10*3/uL (ref 0.0–0.7)
Eosinophils Relative: 2 % (ref 0.0–5.0)
HCT: 43 % (ref 39.0–52.0)
Hemoglobin: 14.5 g/dL (ref 13.0–17.0)
Lymphs Abs: 1.9 10*3/uL (ref 0.7–4.0)
Monocytes Absolute: 0.4 10*3/uL (ref 0.1–1.0)
Monocytes Relative: 5.2 % (ref 3.0–12.0)
Neutro Abs: 5.3 10*3/uL (ref 1.4–7.7)
WBC: 7.8 10*3/uL (ref 4.5–10.5)

## 2011-07-22 LAB — BASIC METABOLIC PANEL
BUN: 14 mg/dL (ref 6–23)
CO2: 26 mEq/L (ref 19–32)
Calcium: 9 mg/dL (ref 8.4–10.5)
Creatinine, Ser: 0.8 mg/dL (ref 0.4–1.5)
GFR: 115.94 mL/min (ref >60.00)
Glucose, Bld: 138 mg/dL — ABNORMAL HIGH (ref 70–99)
Sodium: 140 mEq/L (ref 135–145)

## 2011-07-22 LAB — HEMOGLOBIN A1C: Hgb A1c MFr Bld: 6.6 % — ABNORMAL HIGH (ref 4.6–6.5)

## 2011-07-22 NOTE — Progress Notes (Signed)
  Subjective:    Patient ID: Bradley Jefferson, male    DOB: 1971-03-03, 40 y.o.   MRN: [id number]  HPI CPX Doing well , diet remains good-healthy, physically active   Past Medical History  Diagnosis Date  . GERD (gastroesophageal reflux disease)   . Low back pain     sees Chiro-herman  . Diabetes mellitus 03/2009   Past Surgical History  Procedure Date  . Abscess anal gland     s/p I&D   History   Social History  . Marital Status: Married    Spouse Name: N/A    Number of Children: 0  . Years of Education: N/A   Occupational History  . manage a body shop    Social History Main Topics  . Smoking status: Former Smoker    Quit date: 07/22/1999  . Smokeless tobacco: Never Used  . Alcohol Use: Yes     socially   . Drug Use: No  . Sexually Active: Not on file   Other Topics Concern  . Not on file   Social History Narrative   Regular exercise- yes (wii fit, walks a lot @ work, works outside on [employer]-- trying to eat healthy   Family History  Problem Relation Age of Onset  . Diabetes Father     Father brother (age 25)  . Hyperlipidemia Father   . Stroke Neg Hx   . Colon cancer Neg Hx   . Colon polyps Neg Hx   . Prostate cancer Neg Hx   . Coronary artery disease Neg Hx      Review of Systems No CP-SOB No N-V-D; no abdominal pain, blood in stools  No dysuria or gross hematuria  occ sx of hypoglycemia (nausea that decreases w/food intake but has not checked CBGs when he feels that way), check CBGs rarely     Objective:   Physical Exam  Constitutional: He is oriented to person, place, and time. He appears well-developed and well-nourished. No distress.  HENT:  Head: Normocephalic and atraumatic.  Neck: No thyromegaly present.  Cardiovascular: Normal rate, regular rhythm and normal heart sounds.   No murmur heard. Pulmonary/Chest: Effort normal and breath sounds normal. No respiratory distress. He has no wheezes. He has no rales.  Abdominal: Soft. Bowel  sounds are normal. He exhibits no distension. There is no tenderness. There is no rebound and no guarding.  Musculoskeletal: He exhibits no edema.  Neurological: He is alert and oriented to person, place, and time.  Skin: Skin is warm and dry. He is not diaphoretic.  Psychiatric: He has a normal mood and affect. His behavior is normal. Judgment and thought content normal.          Assessment & Plan:

## 2011-07-22 NOTE — Assessment & Plan Note (Addendum)
Td 08 Will get flu shot at [facility] Diet-exercise discussed Labs EKG-- normal except for brady

## 2011-07-22 NOTE — Assessment & Plan Note (Signed)
Patient requested to discontinue Actos and metformin and switch to Byetta 08-2010 Metformin restarted 12-2010 but self discontinued due to GI side effects Feet exam normal 10-12 Eye care discussed 10-12 Plan: Labs, cont healthy life style

## 2011-07-22 NOTE — Patient Instructions (Addendum)
Follow up 6 months if labs ok, sooner if not at goal Check you sugars ~ 4 times a week (2 times fasting, the other times 2 hours after meals)

## 2011-07-30 ENCOUNTER — Other Ambulatory Visit: Payer: Self-pay | Admitting: Internal Medicine

## 2011-12-16 ENCOUNTER — Other Ambulatory Visit: Payer: Self-pay | Admitting: Internal Medicine

## 2011-12-16 NOTE — Telephone Encounter (Signed)
Prescription sent to pharmacy.

## 2012-01-21 ENCOUNTER — Ambulatory Visit (INDEPENDENT_AMBULATORY_CARE_PROVIDER_SITE_OTHER): Payer: 59 | Admitting: Internal Medicine

## 2012-01-21 ENCOUNTER — Encounter: Payer: Self-pay | Admitting: Internal Medicine

## 2012-01-21 VITALS — BP 136/86 | HR 59 | Temp 98.2°F | Wt 216.0 lb

## 2012-01-21 DIAGNOSIS — H6980 Other specified disorders of Eustachian tube, unspecified ear: Secondary | ICD-10-CM

## 2012-01-21 DIAGNOSIS — E119 Type 2 diabetes mellitus without complications: Secondary | ICD-10-CM

## 2012-01-21 DIAGNOSIS — H6990 Unspecified Eustachian tube disorder, unspecified ear: Secondary | ICD-10-CM

## 2012-01-21 HISTORY — DX: Unspecified eustachian tube disorder, unspecified ear: H69.90

## 2012-01-21 HISTORY — DX: Other specified disorders of Eustachian tube, unspecified ear: H69.80

## 2012-01-21 LAB — BASIC METABOLIC PANEL
Calcium: 8.6 mg/dL (ref 8.4–10.5)
Creatinine, Ser: 0.8 mg/dL (ref 0.4–1.5)
GFR: 110.77 mL/min (ref >60.00)
Sodium: 138 mEq/L (ref 135–145)

## 2012-01-21 LAB — LIPID PANEL
HDL: 34.2 mg/dL — ABNORMAL LOW (ref >39.00)
Total CHOL/HDL Ratio: 5
VLDL: 40.2 mg/dL — ABNORMAL HIGH (ref 0.0–40.0)

## 2012-01-21 MED ORDER — AZELASTINE HCL 0.1 % NA SOLN: 2.0000 | Freq: Two times a day (BID) | NASAL | Status: DC

## 2012-01-21 NOTE — Assessment & Plan Note (Signed)
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 Continue with allegra add astelin, if not better, will call for ENT referral

## 2012-01-21 NOTE — Assessment & Plan Note (Signed)
Seems to be doing great. Labs

## 2012-01-21 NOTE — Progress Notes (Signed)
  Subjective:    Patient ID: Bradley Jefferson, male    DOB: [birth year], 41 y.o.   MRN: [id number]  HPI Routine office visit. Doing well. Good compliance with diabetes medicines, blood sugars usually 90-110. Rarely CBG drops to 70 before a meal and he has mild nausea. Symptoms resolve with eating. Also complains of R>L ear puffiness, " like going to the mountains". Feels like he needs to pop.  Past medical history Diabetes GERD Low back pain, sees a Land.  Past surgical history Status post A&D of an anal gland abcess   Review of Systems Denies actual ear pain or ear discharge. Allergies are well controlled with Allegra. Diet and exercise remain very healthy.     Objective:   Physical Exam  General -- alert, well-developed, and well-nourished. NAD  Neck --no LADs HEENT -- TMs : bulge w/ some fluid, no red or d/c. L wnl; throat w/o redness, face symmetric and not tender to palpation. Lungs -- normal respiratory effort, no intercostal retractions, no accessory muscle use, and normal breath sounds.   Heart-- normal rate, regular rhythm, no murmur, and no gallop.   Extremities-- no pretibial edema bilaterally     Assessment & Plan:

## 2012-01-24 ENCOUNTER — Telehealth: Payer: Self-pay | Admitting: *Deleted

## 2012-01-24 ENCOUNTER — Encounter: Payer: Self-pay | Admitting: *Deleted

## 2012-01-24 MED ORDER — GLIMEPIRIDE 2 MG PO TABS: 2.0000 mg | ORAL_TABLET | Freq: Every day | ORAL | Status: DC

## 2012-01-24 NOTE — Telephone Encounter (Signed)
Refill done.  

## 2012-04-08 ENCOUNTER — Other Ambulatory Visit: Payer: Self-pay | Admitting: Internal Medicine

## 2012-04-08 NOTE — Telephone Encounter (Signed)
Refill done.  

## 2012-05-16 ENCOUNTER — Other Ambulatory Visit: Payer: Self-pay | Admitting: Internal Medicine

## 2012-07-19 ENCOUNTER — Other Ambulatory Visit: Payer: Self-pay | Admitting: Internal Medicine

## 2012-07-21 NOTE — Telephone Encounter (Signed)
Refill done.  

## 2012-07-23 ENCOUNTER — Ambulatory Visit (INDEPENDENT_AMBULATORY_CARE_PROVIDER_SITE_OTHER): Payer: 59 | Admitting: Internal Medicine

## 2012-07-23 ENCOUNTER — Encounter: Payer: Self-pay | Admitting: Internal Medicine

## 2012-07-23 VITALS — BP 138/86 | HR 57 | Temp 98.0°F | Ht 70.0 in | Wt 221.0 lb

## 2012-07-23 DIAGNOSIS — E119 Type 2 diabetes mellitus without complications: Secondary | ICD-10-CM

## 2012-07-23 DIAGNOSIS — Z0000 Encounter for general adult medical examination without abnormal findings: Secondary | ICD-10-CM

## 2012-07-23 LAB — COMPREHENSIVE METABOLIC PANEL
Albumin: 4.1 g/dL (ref 3.5–5.2)
Alkaline Phosphatase: 41 U/L (ref 39–117)
CO2: 27 mEq/L (ref 19–32)
Glucose, Bld: 126 mg/dL — ABNORMAL HIGH (ref 70–99)
Potassium: 4.1 mEq/L (ref 3.5–5.1)
Sodium: 137 mEq/L (ref 135–145)
Total Protein: 7 g/dL (ref 6.0–8.3)

## 2012-07-23 LAB — LIPID PANEL
HDL: 31.3 mg/dL — ABNORMAL LOW (ref >39.00)
Triglycerides: 179 mg/dL — ABNORMAL HIGH (ref 0.0–149.0)

## 2012-07-23 LAB — HEMOGLOBIN A1C: Hgb A1c MFr Bld: 6.5 % (ref 4.6–6.5)

## 2012-07-23 NOTE — Progress Notes (Signed)
  Subjective:    Patient ID: Bradley Jefferson, male    DOB: [birth year], 41 y.o.   MRN: [id number]  HPI CPX  Past medical history Diabetes GERD Low back pain, sees a chiropractor.  Past surgical history Status post A&D of an anal gland abcess  Social history Married, no children Tobacco, quit in 2000 (used to smoke 2 ppd) Alcohol: Drinks socially Occupation-- manages a part department exercise-- active at [employer], walks ~1-3 miles a day Diet-- healthy for the most part  FH DM-- F dx age 56, B dx at age 47 HTN-- F B CAd-- no Colon ca-- no Prostate ca--no  Review of Systems No chest pain or shortness of breath No nausea, vomiting, diarrhea. Ambulatory blood sugars range from 60-200, when blood sugars are low he feels slightly jittery and weak. These events are rare, he does carry some sugar with him. Had a flu shot already    Objective:   Physical Exam  General -- alert, well-developed. No apparent distress.  Neck --no LADs, no thyromegaly Lungs -- normal respiratory effort, no intercostal retractions, no accessory muscle use, and normal breath sounds.   Heart-- normal rate, regular rhythm, no murmur, and no gallop.   Abdomen--soft, non-tender, no distention, no masses, no HSM, no guarding, and no rigidity.   Extremities-- no pretibial edema bilaterally  Neurologic-- alert & oriented X3 and strength normal in all extremities. Psych-- Cognition and judgment appear intact. Alert and cooperative with normal attention span and concentration.  not anxious appearing and not depressed appearing.       Assessment & Plan:

## 2012-07-23 NOTE — Assessment & Plan Note (Signed)
Td 08 Got a  flu shot   Diet-exercise discussed Labs

## 2012-07-23 NOTE — Assessment & Plan Note (Signed)
Had an eye exam 03/2012 On 01/2012 we added amaryl, good compliance, rarely has low blood sugars. Diet and exercise discussed, check A1c.

## 2012-09-04 ENCOUNTER — Other Ambulatory Visit: Payer: Self-pay | Admitting: Family Medicine

## 2012-09-04 NOTE — Telephone Encounter (Signed)
Rx sent.    MW 

## 2012-11-11 ENCOUNTER — Other Ambulatory Visit: Payer: Self-pay | Admitting: Internal Medicine

## 2012-11-11 NOTE — Telephone Encounter (Signed)
Refill done.  

## 2012-11-20 ENCOUNTER — Ambulatory Visit: Payer: 59 | Admitting: Internal Medicine

## 2012-11-21 ENCOUNTER — Other Ambulatory Visit: Payer: Self-pay

## 2012-12-14 ENCOUNTER — Ambulatory Visit (INDEPENDENT_AMBULATORY_CARE_PROVIDER_SITE_OTHER): Payer: 59 | Admitting: Internal Medicine

## 2012-12-14 ENCOUNTER — Encounter: Payer: Self-pay | Admitting: Internal Medicine

## 2012-12-14 VITALS — BP 118/78 | HR 71 | Temp 98.1°F | Wt 222.0 lb

## 2012-12-14 DIAGNOSIS — E119 Type 2 diabetes mellitus without complications: Secondary | ICD-10-CM

## 2012-12-14 DIAGNOSIS — H6980 Other specified disorders of Eustachian tube, unspecified ear: Secondary | ICD-10-CM

## 2012-12-14 DIAGNOSIS — H6981 Other specified disorders of Eustachian tube, right ear: Secondary | ICD-10-CM

## 2012-12-14 NOTE — Assessment & Plan Note (Signed)
Ongoing sx , refer to ent

## 2012-12-14 NOTE — Assessment & Plan Note (Signed)
Feels well, needs to improve life style. Labs

## 2012-12-14 NOTE — Progress Notes (Signed)
  Subjective:    Patient ID: Bradley Jefferson, male    DOB: 1971-08-02, 42 y.o.   MRN: [id number]  HPI ROV Visit took place while computers down, documentation done after the visit DM, good med compliance, life style not the best lately, has a new job, some stress eating, not able to exercise d/t weather. No recent CBGs  Continue w/ a feeling of "fluid behind the R ear", nasal spray did not help  Past Medical History  Diagnosis Date  . GERD (gastroesophageal reflux disease)   . Low back pain     sees Chiro-herman  . Diabetes mellitus 03/2009   Past Surgical History  Procedure Laterality Date  . Abscess anal gland      s/p I&D     Review of Systems No tinnitus or decreased hearing. No RN, sneezing    Objective:   Physical Exam BP 118/78  Pulse 71  Temp(Src) 98.1 F (36.7 C) (Oral)  Wt 222 lb (100.699 kg)  BMI 31.85 kg/m2  SpO2 96%  General -- alert, well-developed  HEENT -- TMs bulge, R>L, no red, no d/c Lungs -- normal respiratory effort, no intercostal retractions, no accessory muscle use, and normal breath sounds.   Heart-- normal rate, regular rhythm, no murmur, and no gallop.   DIABETIC FEET EXAM: No lower extremity edema Normal pedal pulses bilaterally Skin and nails are normal without calluses Pinprick examination of the feet normal. Psych-- Cognition and judgment appear intact. Alert and cooperative with normal attention span and concentration.  not anxious appearing and not depressed appearing.       Assessment & Plan:

## 2012-12-31 ENCOUNTER — Other Ambulatory Visit: Payer: Self-pay | Admitting: Internal Medicine

## 2012-12-31 NOTE — Telephone Encounter (Signed)
Refill done.  

## 2013-04-22 ENCOUNTER — Other Ambulatory Visit: Payer: Self-pay | Admitting: Internal Medicine

## 2013-04-22 NOTE — Telephone Encounter (Signed)
Refill done for one month supply per protocol. Mailed pt. Letter reminding of need for OV per last OV recommendations on 12/14/12 for a 3 month follow up.

## 2013-05-16 ENCOUNTER — Other Ambulatory Visit: Payer: Self-pay | Admitting: Internal Medicine

## 2013-05-17 NOTE — Telephone Encounter (Signed)
Refill done per protocol.  

## 2013-06-17 ENCOUNTER — Other Ambulatory Visit: Payer: Self-pay | Admitting: Internal Medicine

## 2013-06-18 NOTE — Telephone Encounter (Signed)
Pt needs an OV and labs for refills.

## 2013-07-27 ENCOUNTER — Telehealth: Payer: Self-pay

## 2013-07-27 NOTE — Telephone Encounter (Addendum)
Left message for call back x 2 Unable to reach prior to visit

## 2013-07-29 ENCOUNTER — Ambulatory Visit (INDEPENDENT_AMBULATORY_CARE_PROVIDER_SITE_OTHER): Payer: 59 | Admitting: Internal Medicine

## 2013-07-29 ENCOUNTER — Encounter: Payer: Self-pay | Admitting: Internal Medicine

## 2013-07-29 VITALS — BP 155/99 | HR 55 | Temp 98.1°F | Ht 70.1 in | Wt 218.0 lb

## 2013-07-29 DIAGNOSIS — Z0000 Encounter for general adult medical examination without abnormal findings: Secondary | ICD-10-CM

## 2013-07-29 DIAGNOSIS — E119 Type 2 diabetes mellitus without complications: Secondary | ICD-10-CM

## 2013-07-29 DIAGNOSIS — H6980 Other specified disorders of Eustachian tube, unspecified ear: Secondary | ICD-10-CM

## 2013-07-29 DIAGNOSIS — Z23 Encounter for immunization: Secondary | ICD-10-CM

## 2013-07-29 LAB — CBC WITH DIFFERENTIAL/PLATELET
Eosinophils Absolute: 0.2 10*3/uL (ref 0.0–0.7)
Eosinophils Relative: 2.3 % (ref 0.0–5.0)
Lymphocytes Relative: 22.1 % (ref 12.0–46.0)
Lymphs Abs: 1.6 10*3/uL (ref 0.7–4.0)
MCHC: 34.8 g/dL (ref 30.0–36.0)
MCV: 87.1 fl (ref 78.0–100.0)
Monocytes Absolute: 0.4 10*3/uL (ref 0.1–1.0)
Neutrophils Relative %: 69.4 % (ref 43.0–77.0)
Platelets: 183 10*3/uL (ref 150.0–400.0)
RBC: 5.09 Mil/uL (ref 4.22–5.81)
RDW: 12.5 % (ref 11.5–14.6)
WBC: 7.4 10*3/uL (ref 4.5–10.5)

## 2013-07-29 LAB — LIPID PANEL
Cholesterol: 181 mg/dL (ref 0–200)
HDL: 39.5 mg/dL (ref >39.00)
LDL Cholesterol: 112 mg/dL — ABNORMAL HIGH (ref 0–99)
Total CHOL/HDL Ratio: 5
VLDL: 30 mg/dL (ref 0.0–40.0)

## 2013-07-29 LAB — MICROALBUMIN / CREATININE URINE RATIO
Creatinine,U: 78.2 mg/dL
Microalb Creat Ratio: 1.3 mg/g (ref 0.0–30.0)
Microalb, Ur: 1 mg/dL (ref 0.0–1.9)

## 2013-07-29 LAB — COMPREHENSIVE METABOLIC PANEL
ALT: 45 U/L (ref 0–53)
AST: 23 U/L (ref 0–37)
Albumin: 4.4 g/dL (ref 3.5–5.2)
Alkaline Phosphatase: 53 U/L (ref 39–117)
BUN: 9 mg/dL (ref 6–23)
Calcium: 9.2 mg/dL (ref 8.4–10.5)
Chloride: 100 mEq/L (ref 96–112)
Potassium: 3.8 mEq/L (ref 3.5–5.1)
Sodium: 138 mEq/L (ref 135–145)

## 2013-07-29 MED ORDER — GLIMEPIRIDE 2 MG PO TABS: ORAL_TABLET | ORAL | Status: DC

## 2013-07-29 MED ORDER — EXENATIDE 10 MCG/0.04ML ~~LOC~~ SOPN: PEN_INJECTOR | SUBCUTANEOUS | Status: DC

## 2013-07-29 NOTE — Assessment & Plan Note (Addendum)
Good compliance w/ meds, no amb CBGs, diet not very good lately. Due for an eye exam. Feet exam(-) today Plan: Labs, encouraged diet-exercise-eye check up If not at goal consider metformin or increase amaryl (pt preference is amaryl)

## 2013-07-29 NOTE — Assessment & Plan Note (Signed)
Resolved after sae ENT and fluid was "drained"

## 2013-07-29 NOTE — Assessment & Plan Note (Addendum)
Td 08 Flu tomorrow at work [employer] shot today Diet-exercise discussed Labs BP slt elevated, rec self check , will call if > 140/85

## 2013-07-29 NOTE — Patient Instructions (Signed)
Get your blood work before you leave  Next visit in 3 months  for a diabetes follow up . Please make an appointment     If you get labs , XRs or procedures we always communicate back to you. Expect a phone call, a letter or check [facility] frequently for reports. If you don't hear from [place] within few days, call the office

## 2013-07-29 NOTE — Progress Notes (Signed)
  Subjective:    Patient ID: Bradley Jefferson, male    DOB: 11/22/1970, 41 y.o.   MRN: [id number]  HPI CPX  Past Medical History  Diagnosis Date  . GERD (gastroesophageal reflux disease)   . Low back pain     sees Chiro-herman  . Diabetes mellitus 03/2009   Past Surgical History  Procedure Laterality Date  . Abscess anal gland      s/p I&D   History   Social History  . Marital Status: Married    Spouse Name: N/A    Number of Children: 0  . 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 Smoking status: Former Smoker    Quit date: 07/22/1999  . Smokeless tobacco: Never Used  . Alcohol Use: Yes     Comment: socially   . Drug Use: No  . Sexual Activity: Not on file   Other Topics Concern  . Not on file   Social History Narrative               Family History  Problem Relation Age of Onset  . Diabetes Father     Father brother (age 22)  . Hyperlipidemia Father   . Stroke Neg Hx   . Colon cancer Neg Hx   . Colon polyps Neg Hx   . Prostate cancer Neg Hx   . Coronary artery disease Neg Hx    Review of Systems Diet-- not very good lately  Exercise-- active at work and home  No  CP, SOB, lower extremity edema Denies  nausea, vomiting diarrhea Denies  blood in the stools (-) cough, sputum production No dysuria, gross hematuria, difficulty urinating   + stress d/t remodeling his house and other fam issues but no anxiety, depression per se       Objective:   Physical Exam BP 155/99  Pulse 55  Temp(Src) 98.1 F (36.7 C)  Ht 5' 10.1" (1.781 m)  Wt 218 lb (98.884 kg)  BMI 31.17 kg/m2  SpO2 99%  General -- alert, well-developed, NAD.  Neck --no thyromegaly  Lungs -- normal respiratory effort, no intercostal retractions, no accessory muscle use, and normal breath sounds.  Heart-- normal rate, regular rhythm, no murmur.  Abdomen-- Not distended, good bowel sounds,soft, non-tender. DIABETIC FEET  EXAM: No lower extremity edema Normal pedal pulses bilaterally Skin normal, nails normal, no calluses Pinprick examination of the feet normal. Neurologic--  alert & oriented X3. Speech normal, gait normal, strength normal in all extremities.  Psych-- Cognition and judgment appear intact. Cooperative with normal attention span and concentration. No anxious appearing , no depressed appearing.      Assessment & Plan:

## 2013-07-30 ENCOUNTER — Telehealth: Payer: Self-pay | Admitting: *Deleted

## 2013-07-30 MED ORDER — GLIMEPIRIDE 2 MG PO TABS: ORAL_TABLET | ORAL | Status: DC

## 2013-07-30 NOTE — Telephone Encounter (Signed)
Message copied by Eustace Quail on Fri Jul 30, 2013  2:47 PM ------      Message from: Willow Ora E      Created: Fri Jul 30, 2013  1:32 PM       Call pt:      the liver, kidney, potassium, thyroid and blood count are normal.      Diabetes needs better control, is actually slightly worse than before, the A1c Increased from 7.3-7.7.      In addition to work on his diet and exercise and keep taking all other medications we can increase glimepiride 2 mg from 1 tab a day to 3 tablets in the morning, watch for low blood sugars, and return to the office in 3 months.      Call if CBGs less than 80       ------

## 2013-07-30 NOTE — Telephone Encounter (Signed)
Message copied by Eustace Quail on Fri Jul 30, 2013  2:45 PM ------      Message from: PAZ, JOSE E      Created: Fri Jul 30, 2013  1:32 PM       Call pt:      the liver, kidney, potassium, thyroid and blood count are normal.      Diabetes needs better control, is actually slightly worse than before, the A1c Increased from 7.3-7.7.      In addition to work on his diet and exercise and keep taking all other medications we can increase glimepiride 2 mg from 1 tab a day to 3 tablets in the morning, watch for low blood sugars, and return to the office in 3 months.      Call if CBGs less than 80       ------

## 2013-07-30 NOTE — Telephone Encounter (Signed)
rx refilled per protocol. DJR  

## 2013-08-10 ENCOUNTER — Encounter: Payer: Self-pay | Admitting: Internal Medicine

## 2013-08-10 ENCOUNTER — Other Ambulatory Visit: Payer: Self-pay | Admitting: *Deleted

## 2013-08-10 MED ORDER — GLIMEPIRIDE 2 MG PO TABS: ORAL_TABLET | ORAL | Status: DC

## 2013-08-12 ENCOUNTER — Other Ambulatory Visit: Payer: Self-pay

## 2013-09-15 ENCOUNTER — Encounter: Payer: Self-pay | Admitting: Internal Medicine

## 2013-09-15 ENCOUNTER — Ambulatory Visit (INDEPENDENT_AMBULATORY_CARE_PROVIDER_SITE_OTHER): Payer: 59 | Admitting: Internal Medicine

## 2013-09-15 VITALS — BP 130/79 | HR 67 | Temp 98.7°F | Resp 16 | Wt 219.0 lb

## 2013-09-15 DIAGNOSIS — J069 Acute upper respiratory infection, unspecified: Secondary | ICD-10-CM

## 2013-09-15 MED ORDER — AMOXICILLIN 500 MG PO CAPS: 1000.0000 mg | ORAL_CAPSULE | Freq: Two times a day (BID) | ORAL | Status: DC

## 2013-09-15 NOTE — Patient Instructions (Signed)
Rest, fluids , tylenol For cough, take Mucinex DM twice a day as needed  For congestion use OTC Nasocort: 2 nasal sprays on each side of the nose daily until you feel better Get pseudoephedrine 30 mg (behind the counter, you need to talk with the pharmacist) take one tablet 3 or 4 times a day as needed for congestion Take the antibiotic as prescribed  (Amoxicillin) Call if no better in few days Call anytime if the symptoms are severe

## 2013-09-15 NOTE — Progress Notes (Signed)
   Subjective:    Patient ID: Bradley Jefferson, male    DOB: 28-Jul-1971, 42 y.o.   MRN: [id number]  HPI Acute visit, Symptoms started 5 days ago: Head congestion, later on developed chest congestion. Cough which is dry and sometimes persistent. Lack of appetite, lack of energy. Ears feel congested as well. Taking Mucinex DM. This morning he blow abundant nasal discharge from the nose  Past Medical History  Diagnosis Date  . GERD (gastroesophageal reflux disease)   . Low back pain     sees Chiro-herman  . Diabetes mellitus 03/2009   Past Surgical History  Procedure Laterality Date  . Abscess anal gland      s/p I&D   History   Social History  . Marital Status: Married    Spouse Name: N/A    Number of Children: 0  . 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 Smoking status: Former Smoker    Quit date: 07/22/1999  . Smokeless tobacco: Never Used  . Alcohol Use: Yes     Comment: socially   . Drug Use: No  . Sexual Activity: Not on file   Other Topics Concern  . Not on file   Social History Narrative                Review of Systems No fever or chills No nausea or vomiting. No myalgias    Objective:   Physical Exam BP 130/79  Pulse 67  Temp(Src) 98.7 F (37.1 C) (Oral)  Resp 16  Wt 219 lb (99.338 kg)  SpO2 96%  General -- alert, well-developed, NAD.  HEENT-- Not pale. TMs bulge but not red B, normal throat symmetric, no redness or discharge. Face symmetric, sinuses slt tender to palpation symmetrically . Nose  congested.  Lungs -- normal respiratory effort, no intercostal retractions, no accessory muscle use, and normal breath sounds.  Heart-- normal rate, regular rhythm, no murmur.  Extremities-- no pretibial edema bilaterally  Neurologic--  alert & oriented X3. Speech normal, gait normal, strength normal in all extremities.  Psych-- Cognition and judgment appear intact. Cooperative with  normal attention span and concentration. No anxious appearing , no depressed appearing.     Assessment & Plan:  URI, URI and early sinusitis?. See instructions. Diabetes, recommend to decrease dose of Amaryl temporarily if he is not eating much due to acute illness

## 2013-09-15 NOTE — Progress Notes (Signed)
Pre visit review using our clinic review tool, if applicable. No additional management support is needed unless otherwise documented below in the visit note. 

## 2013-09-16 ENCOUNTER — Encounter: Payer: Self-pay | Admitting: Internal Medicine

## 2013-09-17 ENCOUNTER — Encounter: Payer: Self-pay | Admitting: Internal Medicine

## 2013-09-17 ENCOUNTER — Other Ambulatory Visit: Payer: Self-pay | Admitting: Internal Medicine

## 2013-09-17 MED ORDER — HYDROCODONE-HOMATROPINE 5-1.5 MG/5ML PO SYRP: 5.0000 mL | ORAL_SOLUTION | Freq: Three times a day (TID) | ORAL | Status: DC | PRN

## 2013-10-27 ENCOUNTER — Telehealth: Payer: Self-pay

## 2013-10-27 NOTE — Telephone Encounter (Signed)
Was called for reschedule but all is fine for appt on Friday

## 2013-10-29 ENCOUNTER — Ambulatory Visit (INDEPENDENT_AMBULATORY_CARE_PROVIDER_SITE_OTHER): Payer: 59 | Admitting: Internal Medicine

## 2013-10-29 ENCOUNTER — Encounter: Payer: Self-pay | Admitting: Internal Medicine

## 2013-10-29 VITALS — BP 129/90 | HR 71 | Temp 97.9°F | Wt 219.0 lb

## 2013-10-29 DIAGNOSIS — E119 Type 2 diabetes mellitus without complications: Secondary | ICD-10-CM

## 2013-10-29 LAB — HEMOGLOBIN A1C: Hgb A1c MFr Bld: 7.5 % — ABNORMAL HIGH (ref 4.6–6.5)

## 2013-10-29 NOTE — Patient Instructions (Addendum)
Get your blood work before you leave    Next visit is for routine check up regards diabetes  in 4 months , fasting Please make an appointment     If you need more information about a healthy diet, diabetes  visit  the American Heart Association, it  is a Radiographer, therapeuticgreat resource online at:  [email or web address]

## 2013-10-29 NOTE — Assessment & Plan Note (Addendum)
Good compliance with meds, no recent ambulatory CBGs, life style improved. (-) eye check 12-14 per pt Plan: Labs

## 2013-10-29 NOTE — Progress Notes (Signed)
Pre visit review using our clinic review tool, if applicable. No additional management support is needed unless otherwise documented below in the visit note. 

## 2013-10-29 NOTE — Progress Notes (Signed)
   Subjective:    Patient ID: Bradley Jefferson, male    DOB: 02/08/1971, 43 y.o.   MRN: [id number]  HPI Routine office visit for diabetes, recent labs reviewed, reports good medication compliance without apparent side effects. No recent ambulatory CBGs  Past Medical History  Diagnosis Date  . GERD (gastroesophageal reflux disease)   . Low back pain     sees Chiro-herman  . Diabetes mellitus 03/2009   Past Surgical History  Procedure Laterality Date  . Abscess anal gland      s/p I&D   History   Social History  . Marital Status: Married    Spouse Name: N/A    Number of Children: 0  . 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 Smoking status: Former Smoker    Quit date: 07/22/1999  . Smokeless tobacco: Never Used  . Alcohol Use: Yes     Comment: socially   . Drug Use: No  . Sexual Activity: Not on file   Other Topics Concern  . Not on file   Social History Narrative  . No narrative on file     Review of Systems Very seldom has symptoms consistent with low blood sugar.Does carry glucose w/ him. Had his eye check last month --->  normal Denies any feet paresthesias He has improved his diet and is exercising more.    Objective:   Physical Exam  BP 129/90  Pulse 71  Temp(Src) 97.9 F (36.6 C)  Wt 219 lb (99.338 kg)  SpO2 98% General -- alert, well-developed, NAD.  Lungs -- normal respiratory effort, no intercostal retractions, no accessory muscle use, and normal breath sounds.  Heart-- normal rate, regular rhythm, no murmur.  Extremities-- no pretibial edema bilaterally  Neurologic--  alert & oriented X3. Speech normal, gait normal, strength normal in all extremities.  Psych-- Cognition and judgment appear intact. Cooperative with normal attention span and concentration. No anxious or depressed appearing.      Assessment & Plan:

## 2013-11-02 ENCOUNTER — Telehealth: Payer: Self-pay

## 2013-11-02 NOTE — Telephone Encounter (Signed)
Relevant patient education assigned to patient using Emmi. ° °

## 2013-11-03 ENCOUNTER — Encounter: Payer: Self-pay | Admitting: Internal Medicine

## 2013-11-03 NOTE — Telephone Encounter (Signed)
Please advise 

## 2013-11-05 ENCOUNTER — Other Ambulatory Visit: Payer: Self-pay | Admitting: Internal Medicine

## 2013-11-05 MED ORDER — METFORMIN HCL ER 500 MG PO TB24: 500.0000 mg | ORAL_TABLET | Freq: Two times a day (BID) | ORAL | Status: DC

## 2013-11-05 MED ORDER — METFORMIN HCL ER 500 MG PO TB24: 500.0000 mg | ORAL_TABLET | Freq: Every day | ORAL | Status: DC

## 2014-02-15 ENCOUNTER — Other Ambulatory Visit: Payer: Self-pay | Admitting: Internal Medicine

## 2014-02-17 NOTE — Telephone Encounter (Signed)
Rx sent to the pharmacy by e-script.//AB/CMA 

## 2014-02-22 ENCOUNTER — Encounter: Payer: Self-pay | Admitting: Internal Medicine

## 2014-02-23 ENCOUNTER — Telehealth: Payer: Self-pay | Admitting: *Deleted

## 2014-02-23 NOTE — Telephone Encounter (Signed)
Spoke with patient who stated he has appt on Tues 5/28 and will have labwork done then

## 2014-03-01 ENCOUNTER — Ambulatory Visit (INDEPENDENT_AMBULATORY_CARE_PROVIDER_SITE_OTHER): Payer: 59 | Admitting: Internal Medicine

## 2014-03-01 ENCOUNTER — Encounter: Payer: Self-pay | Admitting: Internal Medicine

## 2014-03-01 VITALS — BP 136/87 | HR 60 | Temp 98.2°F | Wt 214.0 lb

## 2014-03-01 DIAGNOSIS — E119 Type 2 diabetes mellitus without complications: Secondary | ICD-10-CM

## 2014-03-01 LAB — HEMOGLOBIN A1C: Hgb A1c MFr Bld: 6.8 % — ABNORMAL HIGH (ref 4.6–6.5)

## 2014-03-01 LAB — COMPREHENSIVE METABOLIC PANEL
ALBUMIN: 4.2 g/dL (ref 3.5–5.2)
ALT: 34 U/L (ref 0–53)
AST: 22 U/L (ref 0–37)
Alkaline Phosphatase: 42 U/L (ref 39–117)
BUN: 10 mg/dL (ref 6–23)
CHLORIDE: 103 meq/L (ref 96–112)
CO2: 29 mEq/L (ref 19–32)
CREATININE: 0.8 mg/dL (ref 0.4–1.5)
Calcium: 9.2 mg/dL (ref 8.4–10.5)
GFR: 109.61 mL/min (ref >60.00)
Glucose, Bld: 114 mg/dL — ABNORMAL HIGH (ref 70–99)
POTASSIUM: 3.9 meq/L (ref 3.5–5.1)
SODIUM: 139 meq/L (ref 135–145)
Total Bilirubin: 1 mg/dL (ref 0.2–1.2)
Total Protein: 7.1 g/dL (ref 6.0–8.3)

## 2014-03-01 LAB — LIPID PANEL
Cholesterol: 159 mg/dL (ref 0–200)
HDL: 39.8 mg/dL (ref >39.00)
LDL Cholesterol: 97 mg/dL (ref 0–99)
TRIGLYCERIDES: 113 mg/dL (ref 0.0–149.0)
Total CHOL/HDL Ratio: 4
VLDL: 22.6 mg/dL (ref 0.0–40.0)

## 2014-03-01 NOTE — Assessment & Plan Note (Signed)
Last A1c 7.5, glimepiride dose increased, 2 months ago he had several episodes of hypoglycemia thus self discontinue Byetta. Apparently he has been doing better with diet and exercise in the last 2 months, currently CBGs 100 to 130  in the mornings and at bedtime, CBGs ~ 80. Plan: Check the A1c Both the patient and I agreed that if possible we decrease glimepiride and increase metformin

## 2014-03-01 NOTE — Progress Notes (Signed)
Pre visit review using our clinic review tool, if applicable. No additional management support is needed unless otherwise documented below in the visit note. 

## 2014-03-01 NOTE — Progress Notes (Signed)
   Subjective:    Patient ID: Bradley Jefferson, male    DOB: Sep 25, 1971, 43 y.o.   MRN: [id number]  DOS:  03/01/2014 Type of  Visit: ROV Here for diabetes management. He seems to be doing very well. See assessment and plan  ROS Denies chest pain or difficulty breathing No lower extremity paresthesias. Vision is at baseline  Past Medical History  Diagnosis Date  . GERD (gastroesophageal reflux disease)   . Low back pain     sees Chiro-herman  . Diabetes mellitus 03/2009    Past Surgical History  Procedure Laterality Date  . Abscess anal gland      s/p I&D    History   Social History  . Marital Status: Married    Spouse Name: N/A    Number of Children: 0  . 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 Smoking status: Former Smoker    Quit date: 07/22/1999  . Smokeless tobacco: Never Used  . Alcohol Use: Yes     Comment: socially   . Drug Use: No  . Sexual Activity: Not on file   Other Topics Concern  . Not on file   Social History Narrative  . No narrative on file        Medication List       This list is accurate as of: 03/01/14  1:03 PM.  Always use your most recent med list.               glimepiride 2 MG tablet  Commonly known as:  AMARYL  TAKE THREE TABLETS BY MOUTH EVERY MORNING     metFORMIN 500 MG 24 hr tablet  Commonly known as:  GLUCOPHAGE XR  Take 1 tablet (500 mg total) by mouth 2 (two) times daily.     ranitidine 150 MG capsule  Commonly known as:  ZANTAC  Take 150 mg by mouth 2 (two) times daily.           Objective:   Physical Exam BP 136/87  Pulse 60  Temp(Src) 98.2 F (36.8 C)  Wt 214 lb (97.07 kg)  SpO2 98%  General -- alert, well-developed, NAD.  Lungs -- normal respiratory effort, no intercostal retractions, no accessory muscle use, and normal breath sounds.  Heart-- normal rate, regular rhythm, no murmur.   Extremities-- no pretibial edema  bilaterally  Neurologic--  alert & oriented X3. Speech normal, gait normal, strength normal in all extremitie Psych-- Cognition and judgment appear intact. Cooperative with normal attention span and concentration. No anxious or depressed appearing.      Assessment & Plan:

## 2014-03-01 NOTE — Patient Instructions (Signed)
Get your blood work before you leave   Next visit is for a physical exam by 07-2014 ,  fasting Please make an appointment    

## 2014-03-04 ENCOUNTER — Encounter: Payer: Self-pay | Admitting: *Deleted

## 2014-03-04 ENCOUNTER — Telehealth: Payer: Self-pay | Admitting: *Deleted

## 2014-03-04 NOTE — Telephone Encounter (Deleted)
Letter sent to pt with lab results.  New Rx for metformin sent

## 2014-03-04 NOTE — Telephone Encounter (Deleted)
Message copied by Baldwin [place] on Fri Mar 04, 2014  4:55 PM ------      Message from: Willow Ora E      Created: Fri Mar 04, 2014 12:20 PM       Advise patient, diabetes definitely improved, A1cis now 6.8.      Plan:      1. Decrease glimepiride from 3 tablets daily to 2 tablets daily      2. Change metformin XR to 850 mg one tablet twice a day (send a new prescription, must be the "XR")      3. Cholesterol is great      He is doing a great job w/  diet and exercise       ------

## 2014-03-06 ENCOUNTER — Other Ambulatory Visit: Payer: Self-pay | Admitting: Internal Medicine

## 2014-03-06 DIAGNOSIS — E119 Type 2 diabetes mellitus without complications: Secondary | ICD-10-CM

## 2014-03-07 ENCOUNTER — Telehealth: Payer: Self-pay | Admitting: Internal Medicine

## 2014-03-07 DIAGNOSIS — E119 Type 2 diabetes mellitus without complications: Secondary | ICD-10-CM

## 2014-03-07 MED ORDER — GLIMEPIRIDE 2 MG PO TABS
2.0000 mg | ORAL_TABLET | Freq: Every day | ORAL | Status: DC
Start: 2014-03-07 — End: 2014-03-08

## 2014-03-07 NOTE — Telephone Encounter (Signed)
New rx for metformin and amaryl sent to [facility] in [place]

## 2014-03-07 NOTE — Telephone Encounter (Signed)
Caller name: [facility] Relation to pt: pharmacy tech Call back [phone] Pharmacy:  Reason for call: Pharmacy tech from [place] called to get clarification on rx glimepiride (AMARYL) 2 MG tablet

## 2014-03-08 MED ORDER — GLIMEPIRIDE 2 MG PO TABS: ORAL_TABLET | ORAL | Status: DC

## 2014-03-08 NOTE — Telephone Encounter (Signed)
Called pharmacy to notify the pharm tech that pt is taking 2 tablets now not 3 tablets.

## 2014-03-08 NOTE — Telephone Encounter (Signed)
[facility] states we wrote rx for glimepiride incorrectly. We wrote it for 2 tablets in the morning and the patient has been taking 3. Please call her back at [phone]

## 2014-03-08 NOTE — Telephone Encounter (Signed)
Done. rx sent with correct sig

## 2014-03-30 ENCOUNTER — Encounter: Payer: Self-pay | Admitting: Internal Medicine

## 2014-03-30 ENCOUNTER — Encounter: Payer: Self-pay | Admitting: *Deleted

## 2014-03-30 ENCOUNTER — Other Ambulatory Visit: Payer: Self-pay | Admitting: *Deleted

## 2014-03-30 DIAGNOSIS — E089 Diabetes mellitus due to underlying condition without complications: Secondary | ICD-10-CM

## 2014-03-30 MED ORDER — METFORMIN HCL ER (OSM) 1000 MG PO TB24: 1000.0000 mg | ORAL_TABLET | Freq: Two times a day (BID) | ORAL | Status: DC

## 2014-03-30 NOTE — Telephone Encounter (Signed)
Rx sent per Dr. Leta JunglingPaz's verbal order.  Metformin 1000mg  XR po BID

## 2014-08-10 ENCOUNTER — Encounter: Payer: Self-pay | Admitting: Internal Medicine

## 2014-08-10 ENCOUNTER — Ambulatory Visit (INDEPENDENT_AMBULATORY_CARE_PROVIDER_SITE_OTHER): Payer: 59 | Admitting: Internal Medicine

## 2014-08-10 VITALS — BP 124/88 | HR 58 | Temp 98.2°F | Ht 70.0 in | Wt 212.1 lb

## 2014-08-10 DIAGNOSIS — Z0000 Encounter for general adult medical examination without abnormal findings: Secondary | ICD-10-CM

## 2014-08-10 DIAGNOSIS — E119 Type 2 diabetes mellitus without complications: Secondary | ICD-10-CM

## 2014-08-10 LAB — MICROALBUMIN / CREATININE URINE RATIO
CREATININE, U: 136.5 mg/dL
MICROALB UR: 0.3 mg/dL (ref 0.0–1.9)
MICROALB/CREAT RATIO: 0.2 mg/g (ref 0.0–30.0)

## 2014-08-10 LAB — HEMOGLOBIN A1C: Hgb A1c MFr Bld: 6.2 % (ref 4.6–6.5)

## 2014-08-10 LAB — BASIC METABOLIC PANEL
BUN: 12 mg/dL (ref 6–23)
CALCIUM: 9.1 mg/dL (ref 8.4–10.5)
CO2: 23 mEq/L (ref 19–32)
CREATININE: 0.9 mg/dL (ref 0.4–1.5)
Chloride: 107 mEq/L (ref 96–112)
GFR: 104.94 mL/min (ref >60.00)
Glucose, Bld: 98 mg/dL (ref 70–99)
Potassium: 4.1 mEq/L (ref 3.5–5.1)
SODIUM: 139 meq/L (ref 135–145)

## 2014-08-10 LAB — LIPID PANEL
CHOLESTEROL: 177 mg/dL (ref 0–200)
HDL: 37.5 mg/dL — ABNORMAL LOW (ref >39.00)
LDL Cholesterol: 109 mg/dL — ABNORMAL HIGH (ref 0–99)
NonHDL: 139.5
TRIGLYCERIDES: 152 mg/dL — AB (ref 0.0–149.0)
Total CHOL/HDL Ratio: 5
VLDL: 30.4 mg/dL (ref 0.0–40.0)

## 2014-08-10 NOTE — Progress Notes (Signed)
Pre visit review using our clinic review tool, if applicable. No additional management support is needed unless otherwise documented below in the visit note. 

## 2014-08-10 NOTE — Assessment & Plan Note (Signed)
Td 08 Had a Flu shot  PNM 2014 Diet-exercise-- doing great Labs  STE

## 2014-08-10 NOTE — Assessment & Plan Note (Signed)
Since the last visit,changed his diet dramatically, had low blood sugars, decrease glimepiride to 1 tablet daily. Still has occasional hypoglycemia. Lost ~ 10 pounds We'll continue with 1 glimepiride tablet daily and metformin Check A1c and micro Okay to decrease or discontinue glimepiride if he continued to have low blood sugars. Continue with his active lifestyle

## 2014-08-10 NOTE — Patient Instructions (Signed)
Get your blood work before you leave    Please come back to the office in 4 -5 months for a routine check up , no     Testicular Self-Exam A self-examination of your testicles involves looking at and feeling your testicles for abnormal lumps or swelling. Several things can cause swelling, lumps, or pain in your testicles. Some of these causes are:  Injuries.  Inflammation.  Infection.  Accumulation of fluids around your testicle (hydrocele).  Twisted testicles (testicular torsion).  Testicular cancer. Self-examination of the testicles and groin areas may be advised if you are at risk for testicular cancer. Risks for testicular cancer include:  An undescended testicle (cryptorchidism).  A history of previous testicular cancer.  A family history of testicular cancer. The testicles are easiest to examine after warm baths or showers and are more difficult to examine when you are cold. This is because the muscles attached to the testicles retract and pull them up higher or into the abdomen. Follow these steps while you are standing:  Hold your penis away from your body.  Roll one testicle between your thumb and forefinger, feeling the entire testicle.  Roll the other testicle between your thumb and forefinger, feeling the entire testicle. Feel for lumps, swelling, or discomfort. A normal testicle is egg shaped and feels firm. It is smooth and not tender. The spermatic cord can be felt as a firm spaghetti-like cord at the back of your testicle. It is also important to examine the crease between the front of your leg and your abdomen. Feel for any bumps that are tender. These could be enlarged lymph nodes.  Document Released: 12/30/2000 Document Revised: 05/26/2013 Document Reviewed: 03/15/2013 [facility] Patient Information 2015 [employer], [place]. This information is not intended to replace advice given to you by your health care provider. Make sure you discuss any questions you have with  your health care provider.

## 2014-08-10 NOTE — Progress Notes (Signed)
   Subjective:    Patient ID: Bradley GrimesDavid Jefferson, male    DOB: 03/04/1971, 43 y.o.   MRN: [id number]  DOS:  08/10/2014 Type of visit - description : cpx Interval history: feeling well   ROS No  CP, SOB  Denies  nausea, vomiting diarrhea, blood in the stools (-) cough, sputum production (-) wheezing, chest congestion  No dysuria, gross hematuria, difficulty urinating  No anxiety, depression No headaches  Denies dizziness      Past Medical History  Diagnosis Date  . GERD (gastroesophageal reflux disease)   . Low back pain     sees Chiro-herman  . Diabetes mellitus 03/2009    Past Surgical History  Procedure Laterality Date  . Abscess anal gland      s/p I&D    History   Social History  . Marital Status: Married    Spouse Name: N/A    Number of Children: 0  . 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 Smoking status: Former Smoker    Quit date: 07/22/1999  . Smokeless tobacco: Never Used  . Alcohol Use: Yes     Comment: socially   . Drug Use: No  . Sexual Activity: Not on file   Other Topics Concern  . Not on file   Social History Narrative   Lives w/ wife     Family History  Problem Relation Age of Onset  . Diabetes Father     Father brother (age 42)  . Hyperlipidemia Father   . Stroke Neg Hx   . Colon cancer Neg Hx   . Colon polyps Neg Hx   . Prostate cancer Neg Hx   . Coronary artery disease Neg Hx        Medication List       This list is accurate as of: 08/10/14 11:59 PM.  Always use your most recent med list.               glimepiride 2 MG tablet  Commonly known as:  AMARYL  Take 2 mg by mouth daily with breakfast.     metformin 1000 MG (OSM) 24 hr tablet  Commonly known as:  FORTAMET  Take 1 tablet (1,000 mg total) by mouth 2 (two) times daily with a meal.     ranitidine 150 MG capsule  Commonly known as:  ZANTAC  Take 150 mg by mouth 2 (two) times daily.           Objective:   Physical Exam BP 124/88 mmHg  Pulse 58  Temp(Src) 98.2 F (36.8 C) (Oral)  Ht 5\' 10"  (1.778 m)  Wt 212 lb 2 oz (96.219 kg)  BMI 30.44 kg/m2  SpO2 99% General -- alert, well-developed, NAD.  Neck --no thyromegaly  HEENT-- Not pale. Lungs -- normal respiratory effort, no intercostal retractions, no accessory muscle use, and normal breath sounds.  Heart-- normal rate, regular rhythm, no murmur.  Abdomen-- Not distended, good bowel sounds,soft, non-tender. DIABETIC FEET EXAM: No lower extremity edema Normal pedal pulses bilaterally Skin normal, nails normal, no calluses Pinprick examination of the feet normal. Neurologic--  alert & oriented X3. Speech normal, gait appropriate for age, strength symmetric and appropriate for age.  Psych-- Cognition and judgment appear intact. Cooperative with normal attention span and concentration. No anxious or depressed appearing.     Assessment & Plan:

## 2014-09-17 ENCOUNTER — Encounter: Payer: Self-pay | Admitting: Internal Medicine

## 2014-09-19 ENCOUNTER — Telehealth: Payer: Self-pay | Admitting: Family

## 2014-09-19 ENCOUNTER — Other Ambulatory Visit: Payer: Self-pay

## 2014-09-19 DIAGNOSIS — J019 Acute sinusitis, unspecified: Secondary | ICD-10-CM

## 2014-09-19 MED ORDER — METFORMIN HCL ER 500 MG PO TB24: ORAL_TABLET | ORAL | Status: DC

## 2014-09-19 MED ORDER — AMOXICILLIN-POT CLAVULANATE 875-125 MG PO TABS: 1.0000 | ORAL_TABLET | Freq: Two times a day (BID) | ORAL | Status: DC

## 2014-09-19 NOTE — Progress Notes (Signed)

## 2015-01-05 ENCOUNTER — Other Ambulatory Visit: Payer: Self-pay

## 2015-01-09 ENCOUNTER — Ambulatory Visit (INDEPENDENT_AMBULATORY_CARE_PROVIDER_SITE_OTHER): Payer: 59 | Admitting: Internal Medicine

## 2015-01-09 ENCOUNTER — Encounter: Payer: Self-pay | Admitting: Internal Medicine

## 2015-01-09 VITALS — BP 122/66 | HR 55 | Temp 98.0°F | Ht 70.0 in | Wt 218.0 lb

## 2015-01-09 DIAGNOSIS — E119 Type 2 diabetes mellitus without complications: Secondary | ICD-10-CM | POA: Diagnosis not present

## 2015-01-09 LAB — BASIC METABOLIC PANEL
BUN: 11 mg/dL (ref 6–23)
CHLORIDE: 101 meq/L (ref 96–112)
CO2: 29 mEq/L (ref 19–32)
Calcium: 9.2 mg/dL (ref 8.4–10.5)
Creatinine, Ser: 0.78 mg/dL (ref 0.40–1.50)
GFR: 115.65 mL/min (ref >60.00)
Glucose, Bld: 187 mg/dL — ABNORMAL HIGH (ref 70–99)
POTASSIUM: 4.2 meq/L (ref 3.5–5.1)
SODIUM: 135 meq/L (ref 135–145)

## 2015-01-09 LAB — ALT: ALT: 55 U/L — AB (ref 0–53)

## 2015-01-09 LAB — HEMOGLOBIN A1C: HEMOGLOBIN A1C: 8.4 % — AB (ref 4.6–6.5)

## 2015-01-09 LAB — AST: AST: 27 U/L (ref 0–37)

## 2015-01-09 LAB — HM DIABETES FOOT EXAM: HM Diabetic Foot Exam: NORMAL

## 2015-01-09 MED ORDER — METFORMIN HCL ER 500 MG PO TB24: 1000.0000 mg | ORAL_TABLET | Freq: Two times a day (BID) | ORAL | Status: DC

## 2015-01-09 NOTE — Assessment & Plan Note (Signed)
Doing great with diet and exercise (during the winter was unable to exercise as much but is getting back on rhythm) On metformin only, blood sugars usually 100 in the morning, occasionally 130 depending on diet Feet exam today negative Eye exam negative 09-2014 Plan: A1c, refill as needed, return to the office 6 months

## 2015-01-09 NOTE — Progress Notes (Signed)
   Subjective:    Patient ID: Bradley Jefferson, male    DOB: 04/25/1971, 44 y.o.   MRN: [id number]  DOS:  01/09/2015 Type of visit - description : rov Interval history: Since the last time he was here, he is not taking Amaryl because low blood sugars, currently on metformin, CBGs very good. Had an eye exam December, negative.    Review of Systems Denies nausea, vomiting, diarrhea No lower extremity paresthesias, he does have plantar fasciitis follow-up by another physician. Vision is normal  Past Medical History  Diagnosis Date  . GERD (gastroesophageal reflux disease)   . Low back pain     sees Chiro-herman  . Diabetes mellitus 03/2009    Past Surgical History  Procedure Laterality Date  . Abscess anal gland      s/p I&D    History   Social History  . Marital Status: Married    Spouse Name: N/A  . Number of Children: 0  . 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 Smoking status: Former Smoker    Quit date: 07/22/1999  . Smokeless tobacco: Never Used  . Alcohol Use: Yes     Comment: socially   . Drug Use: No  . Sexual Activity: Not on file   Other Topics Concern  . Not on file   Social History Narrative   Lives w/ wife        Medication List       This list is accurate as of: 01/09/15  9:15 AM.  Always use your most recent med list.               ALLEGRA PO  Take 1 tablet by mouth daily. OTC     fluticasone 50 MCG/ACT nasal spray  Commonly known as:  FLONASE  Place 1 spray into both nostrils daily. OTC     glimepiride 2 MG tablet  Commonly known as:  AMARYL  Take 2 mg by mouth daily with breakfast.     metFORMIN 500 MG 24 hr tablet  Commonly known as:  GLUCOPHAGE XR  Take 2 tablets twice daily.     ranitidine 150 MG capsule  Commonly known as:  ZANTAC  Take 150 mg by mouth 2 (two) times daily.           Objective:   Physical Exam BP 122/66 mmHg  Pulse 55   Temp(Src) 98 F (36.7 C) (Oral)  Ht 5\' 10"  (1.778 m)  Wt 218 lb (98.884 kg)  BMI 31.28 kg/m2  SpO2 97%  General:   Well developed, well nourished . NAD.  HEENT:  Normocephalic . Face symmetric, atraumatic Diabetic foot exam: Pedal pulses normal, pinprick examination negative, skiing normal. No ulcers. Neurologic:  alert & oriented X3.  Speech normal, gait appropriate for age and unassisted Psych--  Cognition and judgment appear intact.  Cooperative with normal attention span and concentration.  Behavior appropriate. No anxious or depressed appearing.     Assessment & Plan:

## 2015-01-09 NOTE — Progress Notes (Signed)
Pre visit review using our clinic review tool, if applicable. No additional management support is needed unless otherwise documented below in the visit note. 

## 2015-01-09 NOTE — Patient Instructions (Signed)
Get your blood work before you leave    Come back to the office by 08-2015   for a physical exam  Please schedule an appointment at the front desk    Come back fasting        

## 2015-02-25 ENCOUNTER — Telehealth: Payer: Self-pay | Admitting: Family

## 2015-02-25 DIAGNOSIS — J0120 Acute ethmoidal sinusitis, unspecified: Secondary | ICD-10-CM

## 2015-02-25 MED ORDER — AMOXICILLIN-POT CLAVULANATE 875-125 MG PO TABS: 1.0000 | ORAL_TABLET | Freq: Two times a day (BID) | ORAL | Status: DC

## 2015-02-25 NOTE — Progress Notes (Signed)

## 2015-03-05 ENCOUNTER — Other Ambulatory Visit: Payer: Self-pay | Admitting: Internal Medicine

## 2015-04-04 ENCOUNTER — Other Ambulatory Visit: Payer: Self-pay | Admitting: Internal Medicine

## 2015-05-04 ENCOUNTER — Other Ambulatory Visit: Payer: Self-pay | Admitting: *Deleted

## 2015-05-04 ENCOUNTER — Other Ambulatory Visit: Payer: Self-pay | Admitting: Internal Medicine

## 2015-05-04 ENCOUNTER — Encounter: Payer: Self-pay | Admitting: Internal Medicine

## 2015-05-04 MED ORDER — GLIMEPIRIDE 2 MG PO TABS: 2.0000 mg | ORAL_TABLET | Freq: Every day | ORAL | Status: DC

## 2015-05-04 MED ORDER — METFORMIN HCL ER 500 MG PO TB24: 1000.0000 mg | ORAL_TABLET | Freq: Two times a day (BID) | ORAL | Status: DC

## 2015-05-04 NOTE — Telephone Encounter (Signed)
Per MyChart message patient is requesting metformin and glimepiride.  Labs from 01/09/15 state that patient is to be on both medications, but glimepiride is not on medication list.  Spoke to patient who stated he has been taking both and would like refills.  Follow-up appointment scheduled with Dr. Drue Novel and medications sent since last visit clearly stated he was to be on both.

## 2015-05-11 ENCOUNTER — Other Ambulatory Visit: Payer: Self-pay

## 2015-05-12 ENCOUNTER — Ambulatory Visit (INDEPENDENT_AMBULATORY_CARE_PROVIDER_SITE_OTHER): Payer: 59 | Admitting: Internal Medicine

## 2015-05-12 ENCOUNTER — Encounter: Payer: Self-pay | Admitting: Internal Medicine

## 2015-05-12 VITALS — BP 128/88 | HR 61 | Temp 97.9°F | Ht 70.0 in | Wt 221.4 lb

## 2015-05-12 DIAGNOSIS — E119 Type 2 diabetes mellitus without complications: Secondary | ICD-10-CM

## 2015-05-12 LAB — LIPID PANEL
CHOL/HDL RATIO: 3
Cholesterol: 148 mg/dL (ref 0–200)
HDL: 44.5 mg/dL (ref >39.00)
LDL Cholesterol: 85 mg/dL (ref 0–99)
NonHDL: 103.84
Triglycerides: 93 mg/dL (ref 0.0–149.0)
VLDL: 18.6 mg/dL (ref 0.0–40.0)

## 2015-05-12 LAB — HEMOGLOBIN A1C: HEMOGLOBIN A1C: 6.7 % — AB (ref 4.6–6.5)

## 2015-05-12 NOTE — Progress Notes (Signed)
   Subjective:    Patient ID: ACIE CUSTIS, male    DOB: 1971/02/04, 44 y.o.   MRN: [id number]  DOS:  05/12/2015 Type of visit - description : Routine visit Interval history:  Good compliance of medication, diet has improved, he remains active. Occasionally has low blood sugar in the 60s if he is late having his meal.   Review of Systems No chest pain or difficulty breathing. No nausea, vomiting, diarrhea  Past Medical History  Diagnosis Date  . GERD (gastroesophageal reflux disease)   . Low back pain     sees Chiro-herman  . Diabetes mellitus 03/2009    Past Surgical History  Procedure Laterality Date  . Abscess anal gland      s/p I&D    History   Social History  . Marital Status: Married    Spouse Name: N/A  . Number of Children: 0  . 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 Smoking status: Former Smoker    Quit date: 07/22/1999  . Smokeless tobacco: Never Used  . Alcohol Use: Yes     Comment: socially   . Drug Use: No  . Sexual Activity: Not on file   Other Topics Concern  . Not on file   Social History Narrative   Lives w/ wife        Medication List       This list is accurate as of: 05/12/15  5:14 PM.  Always use your most recent med list.               ALLEGRA PO  Take 1 tablet by mouth daily. OTC     fluticasone 50 MCG/ACT nasal spray  Commonly known as:  FLONASE  Place 1 spray into both nostrils daily. OTC     glimepiride 2 MG tablet  Commonly known as:  AMARYL  Take 1 tablet (2 mg total) by mouth daily before breakfast.     metFORMIN 500 MG 24 hr tablet  Commonly known as:  GLUCOPHAGE XR  Take 2 tablets (1,000 mg total) by mouth 2 (two) times daily with a meal.     ranitidine 150 MG capsule  Commonly known as:  ZANTAC  Take 150 mg by mouth 2 (two) times daily.           Objective:   Physical Exam BP 128/88 mmHg  Pulse 61  Temp(Src) 97.9 F (36.6  C) (Oral)  Ht  (1.778 m)  Wt 221 lb 6 oz (100.415 kg)  BMI 31.76 kg/m2  SpO2 97% General:   Well developed, well nourished . NAD.  HEENT:  Normocephalic . Face symmetric, atraumatic Lungs:  CTA B Normal respiratory effort, no intercostal retractions, no accessory muscle use. Heart: RRR,  no murmur.  No pretibial edema bilaterally  Skin: Not pale. Not jaundice Neurologic:  alert & oriented X3.  Speech normal, gait appropriate for age and unassisted Psych--  Cognition and judgment appear intact.  Cooperative with normal attention span and concentration.  Behavior appropriate. No anxious or depressed appearing.      Assessment & Plan:

## 2015-05-12 NOTE — Assessment & Plan Note (Addendum)
Since the last visit he started glimepiride, ambulatory blood sugars in the 120s, depending on diet, CBGs may go up to 160 or low to the 60s He managed low blood sugars very well with some small amount of regular sodas. Plan: Labs Likes his cholesterol check, taking coconut oil and he is curious about see how the cholesterol is doing

## 2015-05-12 NOTE — Patient Instructions (Signed)
Get your blood work before you leave    

## 2015-05-12 NOTE — Progress Notes (Signed)
Pre visit review using our clinic review tool, if applicable. No additional management support is needed unless otherwise documented below in the visit note. 

## 2015-08-05 ENCOUNTER — Other Ambulatory Visit: Payer: Self-pay | Admitting: Internal Medicine

## 2015-08-14 ENCOUNTER — Telehealth: Payer: Self-pay | Admitting: Behavioral Health

## 2015-08-14 NOTE — Telephone Encounter (Signed)
Attempted to reach patient at time of Pre-Visit Call. Unable to  leave a message for a callback; voice mailbox has not been set up.

## 2015-08-15 ENCOUNTER — Ambulatory Visit (INDEPENDENT_AMBULATORY_CARE_PROVIDER_SITE_OTHER): Payer: 59 | Admitting: Internal Medicine

## 2015-08-15 ENCOUNTER — Encounter: Payer: Self-pay | Admitting: Internal Medicine

## 2015-08-15 VITALS — BP 126/74 | HR 72 | Temp 98.2°F | Ht 70.0 in | Wt 224.2 lb

## 2015-08-15 DIAGNOSIS — E119 Type 2 diabetes mellitus without complications: Secondary | ICD-10-CM

## 2015-08-15 DIAGNOSIS — Z23 Encounter for immunization: Secondary | ICD-10-CM | POA: Diagnosis not present

## 2015-08-15 DIAGNOSIS — Z09 Encounter for follow-up examination after completed treatment for conditions other than malignant neoplasm: Secondary | ICD-10-CM

## 2015-08-15 DIAGNOSIS — Z114 Encounter for screening for human immunodeficiency virus [HIV]: Secondary | ICD-10-CM

## 2015-08-15 DIAGNOSIS — Z0000 Encounter for general adult medical examination without abnormal findings: Secondary | ICD-10-CM

## 2015-08-15 HISTORY — DX: Encounter for follow-up examination after completed treatment for conditions other than malignant neoplasm: Z09

## 2015-08-15 LAB — HEMOGLOBIN A1C: HEMOGLOBIN A1C: 8 % — AB (ref 4.6–6.5)

## 2015-08-15 LAB — CBC WITH DIFFERENTIAL/PLATELET
BASOS ABS: 0 10*3/uL (ref 0.0–0.1)
Basophils Relative: 0.4 % (ref 0.0–3.0)
EOS ABS: 0.1 10*3/uL (ref 0.0–0.7)
EOS PCT: 1.6 % (ref 0.0–5.0)
HCT: 45.6 % (ref 39.0–52.0)
HEMOGLOBIN: 15.4 g/dL (ref 13.0–17.0)
LYMPHS ABS: 1.8 10*3/uL (ref 0.7–4.0)
Lymphocytes Relative: 20.6 % (ref 12.0–46.0)
MCHC: 33.7 g/dL (ref 30.0–36.0)
MCV: 89 fl (ref 78.0–100.0)
Monocytes Absolute: 0.4 10*3/uL (ref 0.1–1.0)
Monocytes Relative: 5 % (ref 3.0–12.0)
Neutro Abs: 6.3 10*3/uL (ref 1.4–7.7)
Neutrophils Relative %: 72.4 % (ref 43.0–77.0)
Platelets: 183 10*3/uL (ref 150.0–400.0)
RBC: 5.12 Mil/uL (ref 4.22–5.81)
RDW: 12.5 % (ref 11.5–15.5)
WBC: 8.7 10*3/uL (ref 4.0–10.5)

## 2015-08-15 LAB — BASIC METABOLIC PANEL
BUN: 11 mg/dL (ref 6–23)
CALCIUM: 9.5 mg/dL (ref 8.4–10.5)
CO2: 29 meq/L (ref 19–32)
CREATININE: 0.84 mg/dL (ref 0.40–1.50)
Chloride: 100 mEq/L (ref 96–112)
GFR: 105.87 mL/min (ref >60.00)
Glucose, Bld: 152 mg/dL — ABNORMAL HIGH (ref 70–99)
Potassium: 4.2 mEq/L (ref 3.5–5.1)
SODIUM: 136 meq/L (ref 135–145)

## 2015-08-15 LAB — AST: AST: 27 U/L (ref 0–37)

## 2015-08-15 LAB — MICROALBUMIN / CREATININE URINE RATIO
CREATININE, U: 61.8 mg/dL
MICROALB/CREAT RATIO: 1.1 mg/g (ref 0.0–30.0)
Microalb, Ur: <0.7 mg/dL (ref 0.0–1.9)

## 2015-08-15 LAB — ALT: ALT: 58 U/L — ABNORMAL HIGH (ref 0–53)

## 2015-08-15 MED ORDER — NYSTATIN-TRIAMCINOLONE 100000-0.1 UNIT/GM-% EX OINT: 1.0000 "application " | TOPICAL_OINTMENT | Freq: Two times a day (BID) | CUTANEOUS | Status: DC

## 2015-08-15 NOTE — Assessment & Plan Note (Signed)
DM: Seems to be doing well per last A1c, recheck labs. Foreskin irritation: Recommend Mycolog-II, call if no better RTC 4-5 months

## 2015-08-15 NOTE — Progress Notes (Signed)
Subjective:    Patient ID: Bradley Jefferson, male    DOB: November 15, 1970, 44 y.o.   MRN: [id number]  DOS:  08/15/2015 Type of visit - description : CPX Interval history: Good compliance with medication, no recent CBGs    Review of Systems  Constitutional: No fever. No chills. No unexplained wt changes. No unusual sweats  HEENT: No dental problems, no ear discharge, no facial swelling, no voice changes. No eye discharge, no eye  redness , no  intolerance to light   Respiratory: No wheezing , no  difficulty breathing. No cough , no mucus production  Cardiovascular: No CP, no leg swelling , no  Palpitations  GI: no nausea, no vomiting, no diarrhea , no  abdominal pain.  No blood in the stools. No dysphagia, no odynophagia    Endocrine: No polyphagia, no polyuria , no polydipsia  GU: No dysuria, gross hematuria, difficulty urinating. No urinary urgency, no frequency. Itchy on and off rash on the penis after he used a lubricant during intercourse  2 weeks ago. No d/c, no blisters   Musculoskeletal: No joint swellings or unusual aches or pains  Skin: No change in the color of the skin, palor , no  Rash  Allergic, immunologic: No environmental allergies , no  food allergies  Neurological: No dizziness no  syncope. No headaches. No diplopia, no slurred, no slurred speech, no motor deficits, no facial  Numbness  Hematological: No enlarged lymph nodes, no easy bruising , no unusual bleedings  Psychiatry: No suicidal ideas, no hallucinations, no beavior problems, no confusion.  No unusual/severe anxiety, no depression  Past Medical History  Diagnosis Date  . GERD (gastroesophageal reflux disease)   . Low back pain     sees Chiro-herman  . Diabetes mellitus 03/2009    Past Surgical History  Procedure Laterality Date  . Abscess anal gland      s/p I&D    Social History   Social History  . Marital Status: Married    Spouse Name: N/A  . Number of Children: 0  . Years of  Education: N/A   Occupational History  . manage a car parts department    Social History Main Topics  . Smoking status: Former Smoker    Quit date: 07/22/1999  . Smokeless tobacco: Never Used  . Alcohol Use: Yes     Comment: socially   . Drug Use: No  . Sexual Activity: Not on file   Other Topics Concern  . Not on file   Social History Narrative   Lives w/ wife     Family History  Problem Relation Age of Onset  . Diabetes Father     Father brother (age 54)  . Hyperlipidemia Father   . Stroke Neg Hx   . Colon cancer Neg Hx   . Colon polyps Neg Hx   . Prostate cancer Neg Hx   . Coronary artery disease Neg Hx        Medication List       This list is accurate as of: 08/15/15  5:56 PM.  Always use your most recent med list.               ALLEGRA PO  Take 1 tablet by mouth daily. OTC     fluticasone 50 MCG/ACT nasal spray  Commonly known as:  FLONASE  Place 1 spray into both nostrils daily. OTC     glimepiride 2 MG tablet  Commonly known as:  AMARYL  Take 1 tablet (2 mg total) by mouth daily before breakfast.     metFORMIN 500 MG 24 hr tablet  Commonly known as:  GLUCOPHAGE-XR  Take 2 tablets (1,000 mg total) by mouth 2 (two) times daily with a meal.     nystatin-triamcinolone ointment  Commonly known as:  MYCOLOG  Apply 1 application topically 2 (two) times daily.     ranitidine 150 MG capsule  Commonly known as:  ZANTAC  Take 150 mg by mouth 2 (two) times daily.           Objective:   Physical Exam BP 126/74 mmHg  Pulse 72  Temp(Src) 98.2 F (36.8 C) (Oral)  Ht 5\' 10"  (1.778 m)  Wt 224 lb 4 oz (101.719 kg)  BMI 32.18 kg/m2  SpO2 98% General:   Well developed, well nourished . NAD.  HEENT:  Normocephalic . Face symmetric, atraumatic Lungs:  CTA B Normal respiratory effort, no intercostal retractions, no accessory muscle use. Heart: RRR,  no murmur.  no pretibial edema bilaterally  Abdomen:  Not distended, soft, non-tender. No  rebound or rigidity. No mass,organomegaly GU: Patient is circumcised, mild erythema at the foreskin, the glands of the corona seem enlarged. No penile discharge, no ulcers Skin: Not pale. Not jaundice Neurologic:  alert & oriented X3.  Speech normal, gait appropriate for age and unassisted Psych--  Cognition and judgment appear intact.  Cooperative with normal attention span and concentration.  Behavior appropriate. No anxious or depressed appearing.    Assessment & Plan:   Assessment > DM 2010, no neuropathy GERD Occasional low back pain, sees a chiropractor.  Plan: DM: Seems to be doing well per last A1c, recheck labs. Foreskin irritation: Recommend Mycolog-II, call if no better RTC 4-5 months

## 2015-08-15 NOTE — Progress Notes (Signed)
Pre visit review using our clinic review tool, if applicable. No additional management support is needed unless otherwise documented below in the visit note. 

## 2015-08-15 NOTE — Patient Instructions (Addendum)
Diabetes: Check your blood sugar 2-3 times a week  Check your blood sugar  at different times of the day  GOALS: Fasting before a meal 70- 130 2 hours after a meal less than 180 At bedtime 90-150 Call if consistently not at goal    Get your blood work before you leave   Use the cream twice a day for 10 days, call if not better     Next visit  for a  routine checkup in 4-5 months, fasting.    (15 minutes) Please schedule an appointment at the front desk

## 2015-08-15 NOTE — Assessment & Plan Note (Signed)
Td 08 PNM 2014 Prevnar today Flu shot today Diet-exercise-- counseled  Labs : BMP, AST, ALT, CBC, A1c, micro, HIV

## 2015-08-16 LAB — HIV ANTIBODY (ROUTINE TESTING W REFLEX): HIV: NONREACTIVE

## 2015-08-18 MED ORDER — GLIMEPIRIDE 2 MG PO TABS: 4.0000 mg | ORAL_TABLET | Freq: Every day | ORAL | Status: DC

## 2015-08-18 NOTE — Addendum Note (Signed)
Addended by: Dorette GrateFAULKNER, Karlton Maya C on: 08/18/2015 08:48 AM   Modules accepted: Orders

## 2015-08-19 ENCOUNTER — Other Ambulatory Visit: Payer: Self-pay | Admitting: Internal Medicine

## 2015-08-25 ENCOUNTER — Encounter: Payer: Self-pay | Admitting: Internal Medicine

## 2015-09-03 ENCOUNTER — Other Ambulatory Visit: Payer: Self-pay | Admitting: Internal Medicine

## 2015-11-21 ENCOUNTER — Telehealth: Payer: Self-pay | Admitting: *Deleted

## 2015-11-21 NOTE — Telephone Encounter (Signed)
Received form for Physician Referral for Fitness/SLS 02/14

## 2015-11-24 ENCOUNTER — Encounter: Payer: Self-pay | Admitting: Internal Medicine

## 2015-11-24 NOTE — Telephone Encounter (Signed)
Received completed form from Dr. Drue Novel. Form faxed back to Pt at [phone] as requested by Pt. Form sent for scanning into chart.

## 2015-11-24 NOTE — Telephone Encounter (Signed)
Received fax confirmation on 11/24/2015 at 1101. Pt informed via MyChart that form has been faxed to him and to let us know if he does not receive it.

## 2015-12-02 ENCOUNTER — Encounter (HOSPITAL_BASED_OUTPATIENT_CLINIC_OR_DEPARTMENT_OTHER): Payer: Self-pay | Admitting: Emergency Medicine

## 2015-12-02 ENCOUNTER — Emergency Department (HOSPITAL_BASED_OUTPATIENT_CLINIC_OR_DEPARTMENT_OTHER)
Admission: EM | Admit: 2015-12-02 | Discharge: 2015-12-02 | Disposition: A | Discharge status: Home / Self Care | Payer: 59 | Attending: Emergency Medicine | Admitting: Emergency Medicine

## 2015-12-02 ENCOUNTER — Emergency Department (HOSPITAL_BASED_OUTPATIENT_CLINIC_OR_DEPARTMENT_OTHER): Payer: 59

## 2015-12-02 DIAGNOSIS — E119 Type 2 diabetes mellitus without complications: Secondary | ICD-10-CM | POA: Diagnosis not present

## 2015-12-02 DIAGNOSIS — Z7984 Long term (current) use of oral hypoglycemic drugs: Secondary | ICD-10-CM | POA: Insufficient documentation

## 2015-12-02 DIAGNOSIS — I10 Essential (primary) hypertension: Secondary | ICD-10-CM | POA: Insufficient documentation

## 2015-12-02 DIAGNOSIS — K219 Gastro-esophageal reflux disease without esophagitis: Secondary | ICD-10-CM | POA: Insufficient documentation

## 2015-12-02 DIAGNOSIS — R51 Headache: Secondary | ICD-10-CM | POA: Diagnosis present

## 2015-12-02 DIAGNOSIS — Z79899 Other long term (current) drug therapy: Secondary | ICD-10-CM | POA: Diagnosis not present

## 2015-12-02 DIAGNOSIS — Z7951 Long term (current) use of inhaled steroids: Secondary | ICD-10-CM | POA: Insufficient documentation

## 2015-12-02 DIAGNOSIS — Z87891 Personal history of nicotine dependence: Secondary | ICD-10-CM | POA: Insufficient documentation

## 2015-12-02 LAB — CBC
HCT: 44.4 % (ref 39.0–52.0)
Hemoglobin: 15.8 g/dL (ref 13.0–17.0)
MCH: 30.7 pg (ref 26.0–34.0)
MCHC: 35.6 g/dL (ref 30.0–36.0)
MCV: 86.4 fL (ref 78.0–100.0)
PLATELETS: 187 10*3/uL (ref 150–400)
RBC: 5.14 MIL/uL (ref 4.22–5.81)
RDW: 12.4 % (ref 11.5–15.5)
WBC: 10.9 10*3/uL — AB (ref 4.0–10.5)

## 2015-12-02 LAB — COMPREHENSIVE METABOLIC PANEL
ALBUMIN: 4.9 g/dL (ref 3.5–5.0)
ALT: 66 U/L — ABNORMAL HIGH (ref 17–63)
AST: 30 U/L (ref 15–41)
Alkaline Phosphatase: 47 U/L (ref 38–126)
Anion gap: 11 (ref 5–15)
BUN: 18 mg/dL (ref 6–20)
CHLORIDE: 101 mmol/L (ref 101–111)
CO2: 26 mmol/L (ref 22–32)
Calcium: 9.8 mg/dL (ref 8.9–10.3)
Creatinine, Ser: 0.79 mg/dL (ref 0.61–1.24)
GFR calc Af Amer: >60 mL/min (ref >60)
GFR calc non Af Amer: >60 mL/min (ref >60)
GLUCOSE: 134 mg/dL — AB (ref 65–99)
POTASSIUM: 4.2 mmol/L (ref 3.5–5.1)
Sodium: 138 mmol/L (ref 135–145)
Total Bilirubin: 1 mg/dL (ref 0.3–1.2)
Total Protein: 7.6 g/dL (ref 6.5–8.1)

## 2015-12-02 MED ORDER — KETOROLAC TROMETHAMINE 30 MG/ML IJ SOLN
30.0000 mg | Freq: Once | INTRAMUSCULAR | Status: AC
  Administered 2015-12-02: 30 mg via INTRAVENOUS
  Filled 2015-12-02: qty 1

## 2015-12-02 MED ORDER — SODIUM CHLORIDE 0.9 % IV BOLUS (SEPSIS)
500.0000 mL | Freq: Once | INTRAVENOUS | Status: AC
  Administered 2015-12-02: 500 mL via INTRAVENOUS

## 2015-12-02 MED ORDER — LISINOPRIL 10 MG PO TABS: 10.0000 mg | ORAL_TABLET | Freq: Every day | ORAL | Status: DC

## 2015-12-02 NOTE — ED Provider Notes (Signed)
CSN: [id number]     Arrival date & time 12/02/15  1511 History   First MD Initiated Contact with Patient 12/02/15 1606     Chief Complaint  Patient presents with  . Hypertension     (Consider location/radiation/quality/duration/timing/severity/associated sxs/prior Treatment) The history is provided by the patient and medical records.     Bradley Jefferson is a 45 y.o. male  with a hx of GERD, low back pain, NIDDM presents to the Emergency Department complaining of new onset HTN onset several days.  Pt reports associated generalized, dull headache.  Pt reports he wanted to join a new gym and they check ed his BP last night and had a SBP in the 190's.  Pt reports this morning it was 160/98.  Pt reports they suggested that he get his BP checked.  Pt denies CP, SOB, abd pain, N/V/D, weakness, dizziness, syncope, leg swelling.  Pt reports he has been checking his BP at home with readings in the 160's-90s.  He reports that Dr. Drue Novel has not felt his BP was high enough to begin medications.  Pt reports no improvement in the headache after taking tylenol at 4:30PM.    Past Medical History  Diagnosis Date  . GERD (gastroesophageal reflux disease)   . Low back pain     sees Chiro-herman  . Diabetes mellitus 03/2009   Past Surgical History  Procedure Laterality Date  . Abscess anal gland      s/p I&D   Family History  Problem Relation Age of Onset  . Diabetes Father     Father brother (age 41)  . Hyperlipidemia Father   . Stroke Neg Hx   . Colon cancer Neg Hx   . Colon polyps Neg Hx   . Prostate cancer Neg Hx   . Coronary artery disease Neg Hx    Social History  Substance Use Topics  . Smoking status: Former Smoker    Quit date: 07/22/1999  . Smokeless tobacco: Never Used  . Alcohol Use: Yes     Comment: socially     Review of Systems  Constitutional: Negative for fever, diaphoresis, appetite change, fatigue and unexpected weight change.  HENT: Negative for mouth sores.   Eyes:  Negative for visual disturbance.  Respiratory: Negative for cough, chest tightness, shortness of breath and wheezing.   Cardiovascular: Negative for chest pain.  Gastrointestinal: Negative for nausea, vomiting, abdominal pain, diarrhea and constipation.  Endocrine: Negative for polydipsia, polyphagia and polyuria.  Genitourinary: Negative for dysuria, urgency, frequency and hematuria.  Musculoskeletal: Negative for back pain and neck stiffness.  Skin: Negative for rash.  Allergic/Immunologic: Negative for immunocompromised state.  Neurological: Positive for headaches. Negative for syncope and light-headedness.  Hematological: Does not bruise/bleed easily.  Psychiatric/Behavioral: Negative for sleep disturbance. The patient is not nervous/anxious.       Allergies  Review of patient's allergies indicates no known allergies.  Home Medications   Prior to Admission medications   Medication Sig Start Date End Date Taking? Authorizing Provider  fluticasone (FLONASE) 50 MCG/ACT nasal spray Place 1 spray into both nostrils daily. OTC   Yes Historical Provider, MD  glimepiride (AMARYL) 2 MG tablet Take 2 tablets (4 mg total) by mouth daily before breakfast. 08/18/15  Yes Wanda Plump, MD  metFORMIN (GLUCOPHAGE-XR) 500 MG 24 hr tablet Take 2 tablets (1,000 mg total) by mouth 2 (two) times daily with a meal. 08/21/15  Yes Wanda Plump, MD  nystatin-triamcinolone ointment [facility]) Apply 1 application topically 2 (  two) times daily. 08/15/15  Yes Wanda Plump, MD  Fexofenadine HCl (ALLEGRA PO) Take 1 tablet by mouth daily. OTC    Historical Provider, MD  lisinopril (PRINIVIL,ZESTRIL) 10 MG tablet Take 1 tablet (10 mg total) by mouth daily. 12/02/15   Runa Whittingham, PA-C  ranitidine (ZANTAC) 150 MG capsule Take 150 mg by mouth 2 (two) times daily.      Historical Provider, MD   BP 188/115 mmHg  Pulse 79  Temp(Src) 98.5 F (36.9 C) (Oral)  Resp 18  Ht  (1.778 m)  Wt 102.059 kg  BMI 32.28  kg/m2  SpO2 98% Physical Exam  Constitutional: He is oriented to person, place, and time. He appears well-developed and well-nourished. No distress.  Awake, alert, nontoxic appearance  HENT:  Head: Normocephalic and atraumatic.  Mouth/Throat: Oropharynx is clear and moist. No oropharyngeal exudate.  Eyes: Conjunctivae and EOM are normal. Pupils are equal, round, and reactive to light. No scleral icterus.  No horizontal, vertical or rotational nystagmus  Neck: Normal range of motion. Neck supple.  Full active and passive ROM without pain No midline or paraspinal tenderness No nuchal rigidity or meningeal signs  Cardiovascular: Normal rate, regular rhythm, normal heart sounds and intact distal pulses.   Pulmonary/Chest: Effort normal and breath sounds normal. No respiratory distress. He has no wheezes. He has no rales.  Equal chest expansion  Abdominal: Soft. Bowel sounds are normal. He exhibits no mass. There is no tenderness. There is no rebound and no guarding.  Musculoskeletal: Normal range of motion. He exhibits no edema.  Lymphadenopathy:    He has no cervical adenopathy.  Neurological: He is alert and oriented to person, place, and time. He has normal reflexes. No cranial nerve deficit. He exhibits normal muscle tone. Coordination normal.  Mental Status:  Alert, oriented, thought content appropriate. Speech fluent without evidence of aphasia. Able to follow 2 step commands without difficulty.  Cranial Nerves:  II:  Peripheral visual fields grossly normal, pupils equal, round, reactive to light III,IV, VI: ptosis not present, extra-ocular motions intact bilaterally  V,VII: smile symmetric, facial light touch sensation equal VIII: hearing grossly normal bilaterally  IX,X: midline uvula rise  XI: bilateral shoulder shrug equal and strong XII: midline tongue extension  Motor:  5/5 in upper and lower extremities bilaterally including strong and equal grip strength and  dorsiflexion/plantar flexion Sensory: Pinprick and light touch normal in all extremities.  Deep Tendon Reflexes: 2+ and symmetric  Cerebellar: normal finger-to-nose with bilateral upper extremities Gait: normal gait and balance CV: distal pulses palpable throughout   Skin: Skin is warm and dry. No rash noted. He is not diaphoretic.  Psychiatric: He has a normal mood and affect. His behavior is normal. Judgment and thought content normal.  Nursing note and vitals reviewed.   ED Course  Procedures (including critical care time) Labs Review Labs Reviewed  CBC - Abnormal; Notable for the following:    WBC 10.9 (*)    All other components within normal limits  COMPREHENSIVE METABOLIC PANEL - Abnormal; Notable for the following:    Glucose, Bld 134 (*)    ALT 66 (*)    All other components within normal limits    Imaging Review Ct Head Wo Contrast  12/02/2015  CLINICAL DATA:  Headaches and hypertension for several weeks EXAM: CT HEAD WITHOUT CONTRAST TECHNIQUE: Contiguous axial images were obtained from the base of the skull through the vertex without intravenous contrast. COMPARISON:  None. FINDINGS: The bony calvarium  is intact. The ventricles are of normal size and configuration. No findings to suggest acute hemorrhage, acute infarction or space-occupying mass lesion are noted. IMPRESSION: No acute intracranial abnormality noted. Electronically Signed   By: Alcide Clever M.D.   On: 12/02/2015 17:22   I have personally reviewed and evaluated these images and lab results as part of my medical decision-making.   EKG Interpretation   Date/Time:  Saturday December 02 2015 16:36:36 EST Ventricular Rate:  78 PR Interval:  158 QRS Duration: 91 QT Interval:  375 QTC Calculation: 427 R Axis:   -35 Text Interpretation:  Sinus rhythm Left ventricular hypertrophy Anterior Q  waves, possibly due to LVH Confirmed by Lincoln Brigham [phone]) on 12/02/2015  4:44:22 PM      MDM   Final diagnoses:   Essential hypertension   Bradley Jefferson presents with complaint of ongoing hypertension.  He is here today because he was found to be hypertensive at the fitness center he was attempting to join. Patient with mild, generalized and throbbing headache for the last 6 weeks but no acute onset or thunderclap. No syncope nausea or vomiting. No altered LOC.  No chest pain or shortness of breath, no dyspnea on exertion.  Labs are reassuring and there is no indication of end organ damage. CT head without acute intercranial abnormality, specifically no hemorrhage. Patient given Toradol with improvement in his headache along with small fluid bolus.  Will begin on lisinopril and he is to have primary care follow-up in 2-3 days.  BP 165/101 mmHg  Pulse 81  Temp(Src) 98.5 F (36.9 C) (Oral)  Resp 17  Ht  (1.778 m)  Wt 102.059 kg  BMI 32.28 kg/m2  SpO2 99%       Dierdre Forth, PA-C 12/02/15 1817  Tilden Fossa, MD 12/04/15 1321

## 2015-12-02 NOTE — ED Notes (Signed)
Pt was seen at [facility] for fitness evaluation after being referred by dr. Drue Novel. Upon arrival pts BP was elevated and physical therapist sent pt here to be evaluated for htn

## 2015-12-02 NOTE — Discharge Instructions (Signed)
1. Medications: Lisinopril, usual home medications 2. Treatment: rest, drink plenty of fluids 3. Follow Up: Please followup with your primary doctor in 2-3 days for discussion of your diagnoses and further evaluation after today's visit; if you do not have a primary care doctor use the resource guide provided to find one; Please return to the ER for worsening symptoms    Hypertension Hypertension, commonly called high blood pressure, is when the force of blood pumping through your arteries is too strong. Your arteries are the blood vessels that carry blood from your heart throughout your body. A blood pressure reading consists of a higher number over a lower number, such as 110/72. The higher number (systolic) is the pressure inside your arteries when your heart pumps. The lower number (diastolic) is the pressure inside your arteries when your heart relaxes. Ideally you want your blood pressure below 120/80. Hypertension forces your heart to work harder to pump blood. Your arteries may become narrow or stiff. Having untreated or uncontrolled hypertension can cause heart attack, stroke, kidney disease, and other problems. RISK FACTORS Some risk factors for high blood pressure are controllable. Others are not.  Risk factors you cannot control include:   Race. You may be at higher risk if you are African American.  Age. Risk increases with age.  Gender. Men are at higher risk than women before age 13 years. After age 80, women are at higher risk than men. Risk factors you can control include:  Not getting enough exercise or physical activity.  Being overweight.  Getting too much fat, sugar, calories, or salt in your diet.  Drinking too much alcohol. SIGNS AND SYMPTOMS Hypertension does not usually cause signs or symptoms. Extremely high blood pressure (hypertensive crisis) may cause headache, anxiety, shortness of breath, and nosebleed. DIAGNOSIS To check if you have hypertension, your  health care provider will measure your blood pressure while you are seated, with your arm held at the level of your heart. It should be measured at least twice using the same arm. Certain conditions can cause a difference in blood pressure between your right and left arms. A blood pressure reading that is higher than normal on one occasion does not mean that you need treatment. If it is not clear whether you have high blood pressure, you may be asked to return on a different day to have your blood pressure checked again. Or, you may be asked to monitor your blood pressure at home for 1 or more weeks. TREATMENT Treating high blood pressure includes making lifestyle changes and possibly taking medicine. Living a healthy lifestyle can help lower high blood pressure. You may need to change some of your habits. Lifestyle changes may include:  Following the DASH diet. This diet is high in fruits, vegetables, and whole grains. It is low in salt, red meat, and added sugars.  Keep your sodium intake below 2,300 mg per day.  Getting at least 30-45 minutes of aerobic exercise at least 4 times per week.  Losing weight if necessary.  Not smoking.  Limiting alcoholic beverages.  Learning ways to reduce stress. Your health care provider may prescribe medicine if lifestyle changes are not enough to get your blood pressure under control, and if one of the following is true:  You are 80-64 years of age and your systolic blood pressure is above 140.  You are 68 years of age or older, and your systolic blood pressure is above 150.  Your diastolic blood pressure is above 90.  You have diabetes, and your systolic blood pressure is over 140 or your diastolic blood pressure is over 90.  You have kidney disease and your blood pressure is above 140/90.  You have heart disease and your blood pressure is above 140/90. Your personal target blood pressure may vary depending on your medical conditions, your age, and  other factors. HOME CARE INSTRUCTIONS  Have your blood pressure rechecked as directed by your health care provider.   Take medicines only as directed by your health care provider. Follow the directions carefully. Blood pressure medicines must be taken as prescribed. The medicine does not work as well when you skip doses. Skipping doses also puts you at risk for problems.  Do not smoke.   Monitor your blood pressure at home as directed by your health care provider. SEEK MEDICAL CARE IF:   You think you are having a reaction to medicines taken.  You have recurrent headaches or feel dizzy.  You have swelling in your ankles.  You have trouble with your vision. SEEK IMMEDIATE MEDICAL CARE IF:  You develop a severe headache or confusion.  You have unusual weakness, numbness, or feel faint.  You have severe chest or abdominal pain.  You vomit repeatedly.  You have trouble breathing. MAKE SURE YOU:   Understand these instructions.  Will watch your condition.  Will get help right away if you are not doing well or get worse.   This information is not intended to replace advice given to you by your health care provider. Make sure you discuss any questions you have with your health care provider.   Document Released: 09/23/2005 Document Revised: 02/07/2015 Document Reviewed: 07/16/2013 Elsevier Interactive Patient Education [employer]

## 2015-12-04 ENCOUNTER — Encounter: Payer: Self-pay | Admitting: Internal Medicine

## 2015-12-04 ENCOUNTER — Telehealth: Payer: Self-pay | Admitting: Internal Medicine

## 2015-12-04 DIAGNOSIS — I10 Essential (primary) hypertension: Secondary | ICD-10-CM

## 2015-12-04 NOTE — Telephone Encounter (Signed)
Patient went to the ER a couple of days ago with elevated BP, started lisinopril. He has an appointment to see me 12/27/2015 however he needs an BMP 2 weeks from today. Okay to keep the appointment but arrange a BMP in 2 weeks. DX HTN Of course needs to be sooner if he has problems  or his BP is not improving

## 2015-12-05 MED ORDER — LOSARTAN POTASSIUM 50 MG PO TABS: 50.0000 mg | ORAL_TABLET | Freq: Every day | ORAL | Status: DC

## 2015-12-05 NOTE — Telephone Encounter (Signed)
BMP ordered

## 2015-12-05 NOTE — Telephone Encounter (Signed)
See MyChart message from Pt, which was forwarded to Dr. Drue Novel. Awaiting Dr. Drue Novel response.

## 2015-12-05 NOTE — Telephone Encounter (Signed)
Lisinopril d/c, Losartan 50 mg 1 tablet daily, #30 and 1 refill sent to [facility].

## 2015-12-05 NOTE — Telephone Encounter (Signed)
d/c lisinopril, sen losartan 50 mg one tablet daily #30, 1 RF  Received: Today    Wanda Plump, MD  Conrad , CMA

## 2015-12-08 ENCOUNTER — Other Ambulatory Visit: Payer: Self-pay | Admitting: Internal Medicine

## 2015-12-08 MED ORDER — LOSARTAN POTASSIUM 50 MG PO TABS: 100.0000 mg | ORAL_TABLET | Freq: Every day | ORAL | Status: DC

## 2015-12-18 ENCOUNTER — Encounter: Payer: Self-pay | Admitting: Internal Medicine

## 2015-12-19 ENCOUNTER — Other Ambulatory Visit: Payer: Self-pay | Admitting: Internal Medicine

## 2015-12-19 MED ORDER — LOSARTAN POTASSIUM 100 MG PO TABS: 100.0000 mg | ORAL_TABLET | Freq: Every day | ORAL | Status: DC

## 2015-12-19 NOTE — Telephone Encounter (Signed)
Send a prescription for losartan 100 mg one daily, #30 and one refill  Received: Yesterday    Wanda PlumpJose E Paz, MD  Conrad BurlingtonKaylyn Fatima Fedie, CMA

## 2015-12-19 NOTE — Telephone Encounter (Signed)
Rx sent 

## 2015-12-27 ENCOUNTER — Ambulatory Visit: Payer: 59 | Admitting: Internal Medicine

## 2016-01-05 ENCOUNTER — Encounter: Payer: Self-pay | Admitting: Internal Medicine

## 2016-01-05 ENCOUNTER — Ambulatory Visit (INDEPENDENT_AMBULATORY_CARE_PROVIDER_SITE_OTHER): Payer: 59 | Admitting: Internal Medicine

## 2016-01-05 VITALS — BP 132/78 | HR 56 | Temp 97.9°F | Ht 70.0 in | Wt 221.2 lb

## 2016-01-05 DIAGNOSIS — R7989 Other specified abnormal findings of blood chemistry: Secondary | ICD-10-CM

## 2016-01-05 DIAGNOSIS — Z09 Encounter for follow-up examination after completed treatment for conditions other than malignant neoplasm: Secondary | ICD-10-CM

## 2016-01-05 DIAGNOSIS — K219 Gastro-esophageal reflux disease without esophagitis: Secondary | ICD-10-CM | POA: Diagnosis not present

## 2016-01-05 DIAGNOSIS — E119 Type 2 diabetes mellitus without complications: Secondary | ICD-10-CM

## 2016-01-05 DIAGNOSIS — R945 Abnormal results of liver function studies: Secondary | ICD-10-CM

## 2016-01-05 DIAGNOSIS — I10 Essential (primary) hypertension: Secondary | ICD-10-CM | POA: Diagnosis not present

## 2016-01-05 LAB — BASIC METABOLIC PANEL
BUN: 16 mg/dL (ref 6–23)
CHLORIDE: 104 meq/L (ref 96–112)
CO2: 29 mEq/L (ref 19–32)
Calcium: 9.4 mg/dL (ref 8.4–10.5)
Creatinine, Ser: 0.88 mg/dL (ref 0.40–1.50)
GFR: 100.15 mL/min (ref >60.00)
GLUCOSE: 131 mg/dL — AB (ref 70–99)
POTASSIUM: 4.3 meq/L (ref 3.5–5.1)
SODIUM: 138 meq/L (ref 135–145)

## 2016-01-05 LAB — ALT: ALT: 49 U/L (ref 0–53)

## 2016-01-05 LAB — AST: AST: 27 U/L (ref 0–37)

## 2016-01-05 LAB — HEMOGLOBIN A1C: HEMOGLOBIN A1C: 7.3 % — AB (ref 4.6–6.5)

## 2016-01-05 MED ORDER — ASPIRIN 81 MG PO TABS: 81.0000 mg | ORAL_TABLET | Freq: Every day | ORAL | Status: DC

## 2016-01-05 NOTE — Patient Instructions (Signed)
GO TO THE LAB :      Get the blood work     GO TO THE FRONT DESK  Schedule your next appointment for a  Routine check When?   4 months Fasting?  No

## 2016-01-05 NOTE — [facility] Notes (Signed)
Subjective:    Patient ID: Bradley Jefferson, male    DOB: 07/16/1971, 45 y.o.   MRN: [id number]  DOS:  01/05/2016 Type of visit - description : Routine checkup Interval history:  DM: Good compliance of medication, doing better with diet, going to the gym 5 times a week, loosing some weight per his own scales. CBGs improve, sugars  in the low side at mid morning. No actual readings HTN: Switched  lisinopril to losartan, BP is in the 120s over 70 although in the afternoons sometimes go as high as 140-150/90. Increase LFTs noted  Lately---> denies EtOH but rarely, no Tylenol. GERD: Occasionally breakthrough symptoms some afternoons    Review of Systems  denies chest pain or difficulty breathing No nausea, vomiting, diarrhea  Past Medical History  Diagnosis Date  . GERD (gastroesophageal reflux disease)   . Low back pain     sees Chiro-herman  . Diabetes mellitus 03/2009    Past Surgical History  Procedure Laterality Date  . Abscess anal gland      s/p I&D    Social History   Social History  . Marital Status: Married    Spouse Name: N/A  . Number of Children: 0  . 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 Smoking status: Former Smoker    Quit date: 07/22/1999  . Smokeless tobacco: Never Used  . Alcohol Use: Yes     Comment: socially   . Drug Use: No  . Sexual Activity: Not on file   Other Topics Concern  . Not on file   Social History Narrative   Lives w/ wife        Medication List       This list is accurate as of: 01/05/16  5:50 PM.  Always use your most recent med list.               ALLEGRA PO  Take 1 tablet by mouth daily. Reported on 01/05/2016     aspirin 81 MG tablet  Take 1 tablet (81 mg total) by mouth daily.     fluticasone 50 MCG/ACT nasal spray  Commonly known as:  FLONASE  Place 1 spray into both nostrils daily. OTC     glimepiride 2 MG tablet  Commonly known  as:  AMARYL  Take 2 tablets (4 mg total) by mouth daily before breakfast.     losartan 100 MG tablet  Commonly known as:  COZAAR  Take 1 tablet (100 mg total) by mouth daily.     metFORMIN 500 MG 24 hr tablet  Commonly known as:  GLUCOPHAGE-XR  Take 2 tablets (1,000 mg total) by mouth 2 (two) times daily with a meal.     ranitidine 150 MG capsule  Commonly known as:  ZANTAC  Take 150 mg by mouth 2 (two) times daily.           Objective:   Physical Exam BP 132/78 mmHg  Pulse 56  Temp(Src) 97.9 F (36.6 C) (Oral)  Ht 5\' 10"  (1.778 m)  Wt 221 lb 4 oz (100.358 kg)  BMI 31.75 kg/m2  SpO2 97% General:   Well developed, well nourished . NAD.  HEENT:  Normocephalic . Face symmetric, atraumatic Lungs:  CTA B Normal respiratory effort, no intercostal retractions, no accessory muscle use. Heart: RRR,  no murmur.  No pretibial edema bilaterally  Skin: Not pale. Not jaundice  DIABETIC FEET EXAM: No lower extremity edema Normal pedal pulses bilaterally Skin normal, nails normal, no calluses Pinprick examination of the feet normal. Neurologic:  alert & oriented X3.  Speech normal, gait appropriate for age and unassisted Psych--  Cognition and judgment appear intact.  Cooperative with normal attention span and concentration.  Behavior appropriate. No anxious or depressed appearing.      Assessment & Plan:   Assessment > DM 2010, no neuropathy HTN dx 2-17 GERD Occasional low back pain, sees a chiropractor.  Plan:  DM: Last A1c 8.0, glimepiride dose was increased and continue w/  metformin, doing better with diet and exercise. Check A1c. HTN: Seen at the ER 11-2015 with elevated BP, lisinopril didn't work for the patient, now on losartan. Doing better, still occasional high numbers in the afternoon. No change for now, continue monitoring BPs, consider adjust medication on RTC. Check BMP GERD: Occasionally breakthrough symptoms in the afternoon, recommend to change Zantac  to 300 mg together every morning and watch for triggers. LFTs: Last few LFTs increase, no EtOH, no Tylenol. Recheck. If still elevated consider hepatitis serology Primary care: Start aspirin  RTC 4 months

## 2016-01-05 NOTE — Progress Notes (Signed)
Pre visit review using our clinic review tool, if applicable. No additional management support is needed unless otherwise documented below in the visit note. 

## 2016-01-05 NOTE — Assessment & Plan Note (Signed)
DM: Last A1c 8.0, glimepiride dose was increased and continue w/  metformin, doing better with diet and exercise. Check A1c. HTN: Seen at the ER 11-2015 with elevated BP, lisinopril didn't work for the patient, now on losartan. Doing better, still occasional high numbers in the afternoon. No change for now, continue monitoring BPs, consider adjust medication on RTC. Check BMP GERD: Occasionally breakthrough symptoms in the afternoon, recommend to change Zantac to 300 mg together every morning and watch for triggers. LFTs: Last few LFTs increase, no EtOH, no Tylenol. Recheck. If still elevated consider hepatitis serology Primary care: Start aspirin  RTC 4 months

## 2016-01-08 MED ORDER — SITAGLIPTIN PHOSPHATE 100 MG PO TABS: 100.0000 mg | ORAL_TABLET | Freq: Every day | ORAL | Status: DC

## 2016-01-08 NOTE — Addendum Note (Signed)
Addended byConrad : Raine Elsass D on: 01/08/2016 10:23 AM   Modules accepted: Orders

## 2016-01-15 ENCOUNTER — Encounter: Payer: Self-pay | Admitting: Internal Medicine

## 2016-01-15 ENCOUNTER — Telehealth: Payer: Self-pay | Admitting: *Deleted

## 2016-01-15 NOTE — Telephone Encounter (Signed)
PA for Januvia approved. [id number]

## 2016-01-17 NOTE — Telephone Encounter (Signed)
Elveria RoyalsJessica A Glover, CMA at 01/15/2016 12:10 PM     Status: Signed       Expand All Collapse All   PA for Januvia approved. [id number]

## 2016-02-14 ENCOUNTER — Other Ambulatory Visit: Payer: Self-pay | Admitting: Internal Medicine

## 2016-05-07 ENCOUNTER — Ambulatory Visit (INDEPENDENT_AMBULATORY_CARE_PROVIDER_SITE_OTHER): Payer: 59 | Admitting: Internal Medicine

## 2016-05-07 ENCOUNTER — Encounter: Payer: Self-pay | Admitting: Internal Medicine

## 2016-05-07 VITALS — BP 122/78 | HR 68 | Temp 97.8°F | Resp 14 | Ht 70.0 in | Wt 226.1 lb

## 2016-05-07 DIAGNOSIS — E119 Type 2 diabetes mellitus without complications: Secondary | ICD-10-CM | POA: Diagnosis not present

## 2016-05-07 DIAGNOSIS — I10 Essential (primary) hypertension: Secondary | ICD-10-CM | POA: Diagnosis not present

## 2016-05-07 DIAGNOSIS — K219 Gastro-esophageal reflux disease without esophagitis: Secondary | ICD-10-CM | POA: Diagnosis not present

## 2016-05-07 LAB — HEMOGLOBIN A1C: HEMOGLOBIN A1C: 6.8 % — AB (ref 4.6–6.5)

## 2016-05-07 NOTE — Progress Notes (Signed)
Pre visit review using our clinic review tool, if applicable. No additional management support is needed unless otherwise documented below in the visit note. 

## 2016-05-07 NOTE — Patient Instructions (Signed)
GO TO THE LAB : Get the blood work     GO TO THE FRONT DESK Schedule your next appointment for a  Physical exam by 08-2016 , fasting

## 2016-05-07 NOTE — Assessment & Plan Note (Signed)
DM: Since last OV, doing much better with lifestyle, Januvia was not covered so he is not taking it. Self decreased glimepiride to one tablet daily and takes metformin as prescribed. Patient seemed to be doing great, check A1c HTN: Controlled, last BMP satisfactory Slight increased LFTs: Last number normalized, related to better diet?Marland Kitchen GERD: Since the last visit on omeprazole only, sx are controlled (I wonder if related to dietary changes) RTC 08-2016 CPX

## 2016-05-07 NOTE — [facility] Notes (Signed)
Subjective:    Patient ID: Bradley Jefferson, male    DOB: 16-Oct-1970, 45 y.o.   MRN: [id number]  DOS:  05/07/2016 Type of visit - description : Follow-up Interval history: DM: Doing better with diet and exercise, medication list reviewed better HTN: Good compliance with meds, ambulatory blood pressures in the 120s, occasionally in the morning BPs 130, 140.   Review of Systems  No chest pain, difficulty breathing No nausea, vomiting, diarrhea Past Medical History:  Diagnosis Date  . Diabetes mellitus 03/2009  . GERD 03/07/2009   Qualifier: Diagnosis of  By: Shary Decamp    . GERD (gastroesophageal reflux disease)   . Low back pain    sees Chiro-herman    Past Surgical History:  Procedure Laterality Date  . abscess anal gland     s/p I&D    Social History   Social History  . Marital status: Married    Spouse name: N/A  . Number of children: 0  . Years of education: N/A   Occupational History  . 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   Social History Main Topics  . Smoking status: Former Smoker    Quit date: 07/22/1999  . Smokeless tobacco: Never Used  . Alcohol use Yes     Comment: socially   . Drug use: No  . Sexual activity: Not on file   Other Topics Concern  . Not on file   Social History Narrative   Lives w/ wife        Medication List       Accurate as of 05/07/16  1:38 PM. Always use your most recent med list.          ALLEGRA PO Take 1 tablet by mouth daily. Reported on 01/05/2016   AMARYL 2 MG tablet Generic drug:  glimepiride Take 2 mg by mouth daily with breakfast.   aspirin 81 MG tablet Take 1 tablet (81 mg total) by mouth daily.   fluticasone 50 MCG/ACT nasal spray Commonly known as:  FLONASE Place 1 spray into both nostrils daily. OTC   losartan 100 MG tablet Commonly known as:  COZAAR Take 1 tablet (100 mg total) by mouth daily.   metFORMIN 500 MG 24 hr tablet Commonly known as:  GLUCOPHAGE-XR Take 2 tablets (1,000  mg total) by mouth 2 (two) times daily with a meal.   omeprazole 20 MG capsule Commonly known as:  PRILOSEC Take 20 mg by mouth daily.          Objective:   Physical Exam BP 122/78 (BP Location: Left Arm, Patient Position: Sitting, Cuff Size: Normal)   Pulse 68   Temp 97.8 F (36.6 C) (Oral)   Resp 14   Ht  (1.778 m)   Wt 226 lb 2 oz (102.6 kg)   SpO2 98%   BMI 32.45 kg/m  General:   Well developed, well nourished . NAD.  HEENT:  Normocephalic . Face symmetric, atraumatic Lungs:  CTA B Normal respiratory effort, no intercostal retractions, no accessory muscle use. Heart: RRR,  no murmur.  No pretibial edema bilaterally  Skin: Not pale. Not jaundice Neurologic:  alert & oriented X3.  Speech normal, gait appropriate for age and unassisted Psych--  Cognition and judgment appear intact.  Cooperative with normal attention span and concentration.  Behavior appropriate. No anxious or depressed appearing.      Assessment & Plan:    Assessment > DM 2010, no neuropathy HTN dx 2-17 GERD Occasional  low back pain, sees a [facility].  PLAN  DM: Since last OV, doing much better with lifestyle, Januvia was not covered so he is not taking it. Self decreased glimepiride to one tablet daily and takes metformin as prescribed. Patient seemed to be doing great, check A1c HTN: Controlled, last BMP satisfactory Slight increased LFTs: Last number normalized, related to better diet?Marland Kitchen GERD: Since the last visit on omeprazole only, sx are controlled (I wonder if related to dietary changes) RTC 08-2016 CPX

## 2016-05-15 ENCOUNTER — Other Ambulatory Visit: Payer: Self-pay | Admitting: Internal Medicine

## 2016-06-14 ENCOUNTER — Other Ambulatory Visit: Payer: Self-pay | Admitting: Internal Medicine

## 2016-09-16 LAB — HM DIABETES EYE EXAM

## 2016-09-17 ENCOUNTER — Ambulatory Visit (INDEPENDENT_AMBULATORY_CARE_PROVIDER_SITE_OTHER): Payer: 59 | Admitting: Internal Medicine

## 2016-09-17 ENCOUNTER — Encounter: Payer: Self-pay | Admitting: Internal Medicine

## 2016-09-17 VITALS — BP 128/78 | HR 68 | Temp 98.1°F | Resp 14 | Ht 70.0 in | Wt 235.0 lb

## 2016-09-17 DIAGNOSIS — Z0000 Encounter for general adult medical examination without abnormal findings: Secondary | ICD-10-CM

## 2016-09-17 DIAGNOSIS — E119 Type 2 diabetes mellitus without complications: Secondary | ICD-10-CM | POA: Diagnosis not present

## 2016-09-17 LAB — LDL CHOLESTEROL, DIRECT: Direct LDL: 82 mg/dL

## 2016-09-17 LAB — LIPID PANEL
Cholesterol: 173 mg/dL (ref 0–200)
HDL: 37 mg/dL — ABNORMAL LOW (ref >39.00)
NonHDL: 136.33
Total CHOL/HDL Ratio: 5
Triglycerides: 263 mg/dL — ABNORMAL HIGH (ref 0.0–149.0)
VLDL: 52.6 mg/dL — ABNORMAL HIGH (ref 0.0–40.0)

## 2016-09-17 LAB — BASIC METABOLIC PANEL
BUN: 17 mg/dL (ref 6–23)
CHLORIDE: 103 meq/L (ref 96–112)
CO2: 28 mEq/L (ref 19–32)
CREATININE: 0.88 mg/dL (ref 0.40–1.50)
Calcium: 9.5 mg/dL (ref 8.4–10.5)
GFR: 99.83 mL/min (ref >60.00)
GLUCOSE: 132 mg/dL — AB (ref 70–99)
POTASSIUM: 4.2 meq/L (ref 3.5–5.1)
Sodium: 139 mEq/L (ref 135–145)

## 2016-09-17 LAB — ALT: ALT: 71 U/L — ABNORMAL HIGH (ref 0–53)

## 2016-09-17 LAB — AST: AST: 49 U/L — AB (ref 0–37)

## 2016-09-17 LAB — HEMOGLOBIN A1C: HEMOGLOBIN A1C: 7.6 % — AB (ref 4.6–6.5)

## 2016-09-17 MED ORDER — OMEPRAZOLE 20 MG PO CPDR: 20.0000 mg | DELAYED_RELEASE_CAPSULE | Freq: Every day | ORAL | 3 refills | Status: DC

## 2016-09-17 NOTE — Progress Notes (Signed)
Pre visit review using our clinic review tool, if applicable. No additional management support is needed unless otherwise documented below in the visit note. 

## 2016-09-17 NOTE — Patient Instructions (Signed)
GO TO THE LAB : Get the blood work     GO TO THE FRONT DESK Schedule your next appointment for a  Check up in 3-4 months  

## 2016-09-17 NOTE — [facility] Notes (Signed)
Subjective:    Patient ID: Bradley Jefferson, male    DOB: 02/07/1971, 45 y.o.   MRN: [id number]  DOS:  09/17/2016 Type of visit - description : CPX Interval history: Wife was diagnosed with Hodgkin lymphoma, had chemotherapy, doing okay now. Obviously patient concerned about it, been unable to eat healthier and exercise much but is getting back on track.    Review of Systems Constitutional: No fever. No chills. No unexplained wt changes. No unusual sweats  HEENT: No dental problems, no ear discharge, no facial swelling, no voice changes. No eye discharge, no eye  redness , no  intolerance to light   Respiratory: No wheezing , no  difficulty breathing. No cough , no mucus production  Cardiovascular: No CP, no leg swelling , no  Palpitations  GI: no nausea, no vomiting, no diarrhea , no  abdominal pain.  No blood in the stools. No dysphagia, no odynophagia    Endocrine: No polyphagia, no polyuria , no polydipsia  GU: No dysuria, gross hematuria, difficulty urinating. No urinary urgency, no frequency.  Musculoskeletal: No joint swellings or unusual aches or pains  Skin: No change in the color of the skin, palor , no  Rash  Allergic, immunologic: No environmental allergies , no  food allergies  Neurological: No dizziness no  syncope. No headaches. No diplopia, no slurred, no slurred speech, no motor deficits, no facial  Numbness  Hematological: No enlarged lymph nodes, no easy bruising , no unusual bleedings  Psychiatry: No suicidal ideas, no hallucinations, no beavior problems, no confusion.    Past Medical History:  Diagnosis Date  . Diabetes mellitus 03/2009  . GERD 03/07/2009   Qualifier: Diagnosis of  By: Shary DecampHawks, Stacia    . GERD (gastroesophageal reflux disease)   . Low back pain    sees Chiro-herman    Past Surgical History:  Procedure Laterality Date  . abscess anal gland     s/p I&D    Social History   Social History  . Marital status: Married    Spouse  name: N/A  . Number of children: 0  . Years of education: N/A   Occupational History  . 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   Social History Main Topics  . Smoking status: Former Smoker    Quit date: 07/22/1999  . Smokeless tobacco: Never Used  . Alcohol use Yes     Comment: socially   . Drug use: No  . Sexual activity: Not on file   Other Topics Concern  . Not on file   Social History Narrative   Lives w/ wife, she has h/o breast ca, also dx w/ H. Lymphoma ~ 03-2016, chemo x 216 weeks, doiung ok as off 09-2016     Family History  Problem Relation Age of Onset  . Diabetes Father     Father brother (age 44)  . Hyperlipidemia Father   . CAD Father     first MI age 44  . Stroke Neg Hx   . Colon cancer Neg Hx   . Colon polyps Neg Hx   . Prostate cancer Neg Hx        Medication List       Accurate as of 09/17/16  7:00 PM. Always use your most recent med list.          ALLEGRA PO Take 1 tablet by mouth daily. Reported on 01/05/2016   aspirin 81 MG tablet Take 1 tablet (81 mg total) by  mouth daily.   fluticasone 50 MCG/ACT nasal spray Commonly known as:  FLONASE Place 1 spray into both nostrils daily. OTC   glimepiride 2 MG tablet Commonly known as:  AMARYL Take 1 tablet (2 mg total) by mouth daily with breakfast.   losartan 100 MG tablet Commonly known as:  COZAAR Take 1 tablet (100 mg total) by mouth daily.   metFORMIN 500 MG 24 hr tablet Commonly known as:  GLUCOPHAGE-XR Take 2 tablets (1,000 mg total) by mouth 2 (two) times daily with a meal.   omeprazole 20 MG capsule Commonly known as:  PRILOSEC Take 1 capsule (20 mg total) by mouth daily.          Objective:   Physical Exam BP 128/78 (BP Location: Left Arm, Patient Position: Sitting, Cuff Size: Normal)   Pulse 68   Temp 98.1 F (36.7 C) (Oral)   Resp 14   Ht 5\' 10"  (1.778 m)   Wt 235 lb (106.6 kg)   SpO2 96%   BMI 33.72 kg/m  General:   Well developed, well nourished  . NAD.  Neck: No  thyromegaly  HEENT:  Normocephalic . Face symmetric, atraumatic Lungs:  CTA B Normal respiratory effort, no intercostal retractions, no accessory muscle use. Heart: RRR,  no murmur.  No pretibial edema bilaterally  Abdomen:  Not distended, soft, non-tender. No rebound or rigidity. DIABETIC FEET EXAM: No lower extremity edema Normal pedal pulses bilaterally Skin normal, nails normal, no calluses Pinprick examination of the feet normal. Skin: Exposed areas without rash. Not pale. Not jaundice Neurologic:  alert & oriented X3.  Speech normal, gait appropriate for age and unassisted Strength symmetric and appropriate for age.  Psych: Cognition and judgment appear intact.  Cooperative with normal attention span and concentration.  Behavior appropriate. No anxious or depressed appearing.     Assessment & Plan:   Assessment   DM 2010, no neuropathy HTN dx 2-17 GERD Occasional low back pain, sees a chiropractor.  PLAN  DM: Continue glimepiride and metformin. Lifestyle has not been the best lately, wife was dx w/ lymphoma. He is getting back on a healthier lifestyle. Emotionally doing well. CBGs in the morning range from 100-180 Recent eye exam normal, foot exam normal today. Will check A1c. HTN: On losartan, BP is very good.  GERD: RF omeprazole RTC 3-4 months ROV

## 2016-09-17 NOTE — Assessment & Plan Note (Addendum)
Td 08; PNM 2014; Prevnar 2016 Had a Flu shot  Diet-exercise--  discussed. Was doing really well until wife was dx w/ cancer. His getting back on track. Labs: BMP, AST, ALT, FLP, A1c

## 2016-09-17 NOTE — Assessment & Plan Note (Signed)
DM: Continue glimepiride and metformin. Lifestyle has not been the best lately, wife was dx w/ lymphoma. He is getting back on a healthier lifestyle. Emotionally doing well. CBGs in the morning range from 100-180 Recent eye exam normal, foot exam normal today. Will check A1c. HTN: On losartan, BP is very good.  GERD: RF omeprazole RTC 3-4 months ROV

## 2016-10-30 ENCOUNTER — Other Ambulatory Visit: Payer: Self-pay | Admitting: Internal Medicine

## 2016-12-04 ENCOUNTER — Other Ambulatory Visit: Payer: Self-pay | Admitting: Internal Medicine

## 2016-12-20 ENCOUNTER — Encounter: Payer: Self-pay | Admitting: Internal Medicine

## 2016-12-20 ENCOUNTER — Ambulatory Visit (INDEPENDENT_AMBULATORY_CARE_PROVIDER_SITE_OTHER): Payer: 59 | Admitting: Internal Medicine

## 2016-12-20 VITALS — BP 120/78 | HR 66 | Temp 98.2°F | Resp 14 | Ht 70.0 in | Wt 229.5 lb

## 2016-12-20 DIAGNOSIS — E119 Type 2 diabetes mellitus without complications: Secondary | ICD-10-CM | POA: Diagnosis not present

## 2016-12-20 DIAGNOSIS — R7989 Other specified abnormal findings of blood chemistry: Secondary | ICD-10-CM | POA: Diagnosis not present

## 2016-12-20 DIAGNOSIS — R945 Abnormal results of liver function studies: Secondary | ICD-10-CM

## 2016-12-20 LAB — HEMOGLOBIN A1C: Hgb A1c MFr Bld: 8.1 % — ABNORMAL HIGH (ref 4.6–6.5)

## 2016-12-20 LAB — AST: AST: 24 U/L (ref 0–37)

## 2016-12-20 LAB — ALT: ALT: 46 U/L (ref 0–53)

## 2016-12-20 NOTE — Progress Notes (Signed)
Pre visit review using our clinic review tool, if applicable. No additional management support is needed unless otherwise documented below in the visit note. 

## 2016-12-20 NOTE — Patient Instructions (Signed)
GO TO THE LAB : Get the blood work     GO TO THE FRONT DESK Schedule your next appointment for a  Check up in 5 to 6 months   

## 2016-12-20 NOTE — [facility] Notes (Signed)
Subjective:    Patient ID: Bradley Jefferson, male    DOB: 09/09/1971, 46 y.o.   MRN: [id number]  DOS:  12/20/2016 Type of visit - description : rov Interval history: Since the last office visit, he is doing better with diet and exercise, has lost about 6 pounds. Wife was diagnosed with lymphoma, fortunately she is doing well. She actually is back on the gym as well. Good med compliance without apparent side effects.  Wt Readings from Last 3 Encounters:  12/20/16 229 lb 8 oz (104.1 kg)  09/17/16 235 lb (106.6 kg)  05/07/16 226 lb 2 oz (102.6 kg)     Review of Systems Denies nausea, vomiting, diarrhea  Past Medical History:  Diagnosis Date  . Diabetes mellitus 03/2009  . GERD 03/07/2009   Qualifier: Diagnosis of  By: Shary DecampHawks, Stacia    . GERD (gastroesophageal reflux disease)   . Low back pain    sees Chiro-herman    Past Surgical History:  Procedure Laterality Date  . abscess anal gland     s/p I&D    Social History   Social History  . Marital status: Married    Spouse name: N/A  . Number of children: 0  . Years of education: N/A   Occupational History  . 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   Social History Main Topics  . Smoking status: Former Smoker    Quit date: 07/22/1999  . Smokeless tobacco: Never Used  . Alcohol use Yes     Comment: socially   . Drug use: No  . Sexual activity: Not on file   Other Topics Concern  . Not on file   Social History Narrative   Lives w/ wife, she has h/o breast ca, also dx w/ H. Lymphoma ~ 03-2016, chemo x 216 weeks, doiung ok as off 09-2016      Allergies as of 12/20/2016   No Known Allergies     Medication List       Accurate as of 12/20/16 11:59 PM. Always use your most recent med list.          ALEVE 220 MG tablet Generic drug:  naproxen sodium Take 220 mg by mouth 2 (two) times daily with a meal.   ALLEGRA PO Take 1 tablet by mouth daily. Reported on 01/05/2016   aspirin 81 MG tablet Take 1  tablet (81 mg total) by mouth daily.   fluticasone 50 MCG/ACT nasal spray Commonly known as:  FLONASE Place 1 spray into both nostrils daily. OTC   glimepiride 2 MG tablet Commonly known as:  AMARYL Take 1 tablet (2 mg total) by mouth daily with breakfast.   losartan 100 MG tablet Commonly known as:  COZAAR Take 1 tablet (100 mg total) by mouth daily.   metFORMIN 500 MG 24 hr tablet Commonly known as:  GLUCOPHAGE-XR Take 2 tablets (1,000 mg total) by mouth 2 (two) times daily with a meal.   omeprazole 20 MG capsule Commonly known as:  PRILOSEC Take 1 capsule (20 mg total) by mouth daily.          Objective:   Physical Exam BP 120/78 (BP Location: Left Arm, Patient Position: Sitting, Cuff Size: Normal)   Pulse 66   Temp 98.2 F (36.8 C) (Oral)   Resp 14   Ht 5\' 10"  (1.778 m)   Wt 229 lb 8 oz (104.1 kg)   SpO2 97%   BMI 32.93 kg/m  General:   Well developed,  well nourished . NAD.  HEENT:  Normocephalic . Face symmetric, atraumatic Lungs:  CTA B Normal respiratory effort, no intercostal retractions, no accessory muscle use. Heart: RRR,  no murmur.  No pretibial edema bilaterally  Skin: Not pale. Not jaundice Neurologic:  alert & oriented X3.  Speech normal, gait appropriate for age and unassisted Psych--  Cognition and judgment appear intact.  Cooperative with normal attention span and concentration.  Behavior appropriate. No anxious or depressed appearing.      Assessment & Plan:   Assessment   DM 2010, no neuropathy HTN dx 2-17 GERD Occasional low back pain, sees a chiropractor.  PLAN  DM: Continue glimepiride, metformin. Has improved diet and exercise, recheck in a A1c Increase LFTs: Mild increase of LFTs for the last 2 years, last set of LFTs was most elevated and coinciding with poor diet. Fatty liver?. Plan: ast-alt,  hepatitis serologies.  RTC 5-6 months

## 2016-12-21 LAB — HEPATITIS C ANTIBODY: HCV AB: NEGATIVE

## 2016-12-21 LAB — HEPATITIS B CORE ANTIBODY, TOTAL: Hep B Core Total Ab: NONREACTIVE

## 2016-12-21 LAB — HEPATITIS B SURFACE ANTIGEN: Hepatitis B Surface Ag: NEGATIVE

## 2016-12-22 NOTE — Assessment & Plan Note (Signed)
DM: Continue glimepiride, metformin. Has improved diet and exercise, recheck in a A1c Increase LFTs: Mild increase of LFTs for the last 2 years, last set of LFTs was most elevated and coinciding with poor diet. Fatty liver?. Plan: ast-alt,  hepatitis serologies.  RTC 5-6 months

## 2016-12-25 MED ORDER — GLIMEPIRIDE 4 MG PO TABS: 6.0000 mg | ORAL_TABLET | Freq: Every day | ORAL | 5 refills | Status: DC

## 2016-12-25 NOTE — Addendum Note (Signed)
Addended byConrad Orestes: Jearline Hirschhorn D on: 12/25/2016 08:17 AM   Modules accepted: Orders

## 2017-01-05 ENCOUNTER — Other Ambulatory Visit: Payer: Self-pay | Admitting: Internal Medicine

## 2017-05-22 ENCOUNTER — Ambulatory Visit (INDEPENDENT_AMBULATORY_CARE_PROVIDER_SITE_OTHER): Payer: 59 | Admitting: Internal Medicine

## 2017-05-22 ENCOUNTER — Encounter: Payer: Self-pay | Admitting: Internal Medicine

## 2017-05-22 VITALS — BP 126/78 | HR 78 | Temp 97.7°F | Resp 14 | Ht 70.0 in | Wt 230.5 lb

## 2017-05-22 DIAGNOSIS — Z23 Encounter for immunization: Secondary | ICD-10-CM

## 2017-05-22 DIAGNOSIS — R7989 Other specified abnormal findings of blood chemistry: Secondary | ICD-10-CM

## 2017-05-22 DIAGNOSIS — M722 Plantar fascial fibromatosis: Secondary | ICD-10-CM

## 2017-05-22 DIAGNOSIS — I10 Essential (primary) hypertension: Secondary | ICD-10-CM

## 2017-05-22 DIAGNOSIS — E119 Type 2 diabetes mellitus without complications: Secondary | ICD-10-CM

## 2017-05-22 DIAGNOSIS — R945 Abnormal results of liver function studies: Secondary | ICD-10-CM

## 2017-05-22 LAB — CBC WITH DIFFERENTIAL/PLATELET
BASOS PCT: 0.6 % (ref 0.0–3.0)
Basophils Absolute: 0 10*3/uL (ref 0.0–0.1)
EOS ABS: 0.2 10*3/uL (ref 0.0–0.7)
EOS PCT: 2.7 % (ref 0.0–5.0)
HCT: 43 % (ref 39.0–52.0)
Hemoglobin: 14.7 g/dL (ref 13.0–17.0)
LYMPHS ABS: 2 10*3/uL (ref 0.7–4.0)
Lymphocytes Relative: 26.9 % (ref 12.0–46.0)
MCHC: 34 g/dL (ref 30.0–36.0)
MCV: 89.3 fl (ref 78.0–100.0)
MONO ABS: 0.5 10*3/uL (ref 0.1–1.0)
Monocytes Relative: 6.2 % (ref 3.0–12.0)
NEUTROS PCT: 63.6 % (ref 43.0–77.0)
Neutro Abs: 4.8 10*3/uL (ref 1.4–7.7)
Platelets: 183 10*3/uL (ref 150.0–400.0)
RBC: 4.82 Mil/uL (ref 4.22–5.81)
RDW: 12.8 % (ref 11.5–15.5)
WBC: 7.5 10*3/uL (ref 4.0–10.5)

## 2017-05-22 LAB — COMPREHENSIVE METABOLIC PANEL
ALBUMIN: 4.3 g/dL (ref 3.5–5.2)
ALT: 56 U/L — ABNORMAL HIGH (ref 0–53)
AST: 25 U/L (ref 0–37)
Alkaline Phosphatase: 43 U/L (ref 39–117)
BUN: 15 mg/dL (ref 6–23)
CALCIUM: 9.1 mg/dL (ref 8.4–10.5)
CHLORIDE: 102 meq/L (ref 96–112)
CO2: 29 meq/L (ref 19–32)
Creatinine, Ser: 0.95 mg/dL (ref 0.40–1.50)
GFR: 91.11 mL/min (ref >60.00)
Glucose, Bld: 164 mg/dL — ABNORMAL HIGH (ref 70–99)
POTASSIUM: 4 meq/L (ref 3.5–5.1)
SODIUM: 136 meq/L (ref 135–145)
Total Bilirubin: 0.7 mg/dL (ref 0.2–1.2)
Total Protein: 6.6 g/dL (ref 6.0–8.3)

## 2017-05-22 LAB — HEMOGLOBIN A1C: HEMOGLOBIN A1C: 8.4 % — AB (ref 4.6–6.5)

## 2017-05-22 MED ORDER — TRIAMCINOLONE ACETONIDE 0.5 % EX CREA: 1.0000 "application " | TOPICAL_CREAM | Freq: Every day | CUTANEOUS | 1 refills | Status: DC | PRN

## 2017-05-22 NOTE — Patient Instructions (Signed)
GO TO THE LAB : Get the blood work     GO TO THE FRONT DESK Schedule your next appointment for a  physical exam by December 2018

## 2017-05-22 NOTE — Progress Notes (Signed)
Subjective:    Patient ID: Bradley Jefferson, male    DOB: 01/29/1971, 46 y.o.   MRN: [id number]  DOS:  05/22/2017 Type of visit - description : Routine Interval history: DM: Good compliance of medication, ambulatory CBGs varies. No CBG readings. HTN: Good compliance with medication, ambulatory BPs in the 120s Plantar fasciitis: Has become issue again despite stretching. Ear eczema, using triamcinolone acetonide with excellent results. Increase LFTs: Hepatitis serologies negative recently. Request a testosterone check, b/c despite exercising he is not able to lose much weight.  Review of Systems No chest pain, difficulty breathing or lower extremity edema. Some stress, wife has lymphoma, pt  is handling stress well. Energy level is satisfactory, he has a full-time job, goes to [employer] frequently and is doing a lot of housework. Libido normal  Past Medical History:  Diagnosis Date  . Diabetes mellitus 03/2009  . GERD 03/07/2009   Qualifier: Diagnosis of  By: Shary Decamp    . GERD (gastroesophageal reflux disease)   . Low back pain    sees Chiro-herman    Past Surgical History:  Procedure Laterality Date  . abscess anal gland     s/p I&D    Social History   Social History  . Marital status: Married    Spouse name: N/A  . Number of children: 0  . Years of education: N/A   Occupational History  . 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   Social History Main Topics  . Smoking status: Former Smoker    Quit date: 07/22/1999  . Smokeless tobacco: Never Used  . Alcohol use Yes     Comment: socially   . Drug use: No  . Sexual activity: Not on file   Other Topics Concern  . Not on file   Social History Narrative   Lives w/ wife, she has h/o breast ca, also dx w/ H. Lymphoma ~ 03-2016, chemo x 216 weeks, doiung ok as off 09-2016      Allergies as of 05/22/2017   No Known Allergies     Medication List       Accurate as of 05/22/17 11:59 PM. Always use your  most recent med list.          ALEVE 220 MG tablet Generic drug:  naproxen sodium Take 220 mg by mouth 2 (two) times daily with a meal.   ALLEGRA PO Take 1 tablet by mouth daily. Reported on 01/05/2016   aspirin 81 MG tablet Take 1 tablet (81 mg total) by mouth daily.   fluticasone 50 MCG/ACT nasal spray Commonly known as:  FLONASE Place 1 spray into both nostrils daily. OTC   glimepiride 4 MG tablet Commonly known as:  AMARYL Take 1.5 tablets (6 mg total) by mouth daily with breakfast.   losartan 100 MG tablet Commonly known as:  COZAAR Take 1 tablet (100 mg total) by mouth daily.   metFORMIN 500 MG 24 hr tablet Commonly known as:  GLUCOPHAGE-XR Take 2 tablets (1,000 mg total) by mouth 2 (two) times daily with a meal.   omeprazole 20 MG capsule Commonly known as:  PRILOSEC Take 1 capsule (20 mg total) by mouth daily.   triamcinolone cream 0.5 % Commonly known as:  KENALOG Apply 1 application topically daily as needed.          Objective:   Physical Exam BP 126/78 (BP Location: Left Arm, Patient Position: Sitting, Cuff Size: Normal)   Pulse 78   Temp 97.7  F (36.5 C) (Oral)   Resp 14   Ht 5\' 10"  (1.778 m)   Wt 230 lb 8 oz (104.6 kg)   SpO2 97%   BMI 33.07 kg/m  General:   Well developed, well nourished . NAD.  HEENT:  Normocephalic . Face symmetric, atraumatic Lungs:  CTA B Normal respiratory effort, no intercostal retractions, no accessory muscle use. Heart: RRR,  no murmur.  No pretibial edema bilaterally  Skin: Not pale. Not jaundice Neurologic:  alert & oriented X3.  Speech normal, gait appropriate for age and unassisted Psych--  Cognition and judgment appear intact.  Cooperative with normal attention span and concentration.  Behavior appropriate. No anxious or depressed appearing.      Assessment & Plan:   Assessment   DM 2010, no neuropathy HTN dx 2-17 GERD Occasional low back pain, sees a chiropractor. Occasional increase LFTs:  Hepatitis B and C negative 12-2016 Ear eczema: Occasionally uses triamcinolone.  PLAN  DM: Last A1c 8.1, currently on glimepiride, metformin. Lifestyle seems satisfactory. Check A1c. If not controlled consider add Januvia or Victoza. Patient aware. HTN: Seems well-controlled with losartan, check a CMP and CBC. Increase LFTs: See previous entry, hepatitis B and C negative. Recheck LFTs today. Plantar fasciitis: Encourage stretching, plans to see the foot doctor if needed. Likes a testosterone checked, will do, no major symptoms. Has ear skin irritation on and off, he controls it with occ use of triamcinolone. Will refill RTC 09/2017. CPX.

## 2017-05-22 NOTE — Progress Notes (Signed)
Pre visit review using our clinic review tool, if applicable. No additional management support is needed unless otherwise documented below in the visit note. 

## 2017-05-23 NOTE — Assessment & Plan Note (Signed)
DM: Last A1c 8.1, currently on glimepiride, metformin. Lifestyle seems satisfactory. Check A1c. If not controlled consider add Januvia or Victoza. Patient aware. HTN: Seems well-controlled with losartan, check a CMP and CBC. Increase LFTs: See previous entry, hepatitis B and C negative. Recheck LFTs today. Plantar fasciitis: Encourage stretching, plans to see the foot doctor if needed. Likes a testosterone checked, will do, no major symptoms. Has ear skin irritation on and off, he controls it with occ use of triamcinolone. Will refill RTC 09/2017. CPX.

## 2017-05-26 ENCOUNTER — Telehealth: Payer: Self-pay

## 2017-05-26 MED ORDER — SITAGLIPTIN PHOSPHATE 100 MG PO TABS
100.0000 mg | ORAL_TABLET | Freq: Every day | ORAL | 4 refills | Status: DC
Start: 2017-05-26 — End: 2017-05-26

## 2017-05-26 MED ORDER — LINAGLIPTIN 5 MG PO TABS: 5.0000 mg | ORAL_TABLET | Freq: Every day | ORAL | 4 refills | Status: DC

## 2017-05-26 NOTE — Addendum Note (Signed)
Addended byConrad Los Berros D on: 05/26/2017 03:15 PM   Modules accepted: Orders

## 2017-05-26 NOTE — Telephone Encounter (Signed)
Received PA request for Januvia 100mg . Januvia not covered. Okay to change to Tradjenta 5mg  daily per PCP. Rx sent.

## 2017-05-27 LAB — TESTOS,TOTAL,FREE AND SHBG (FEMALE)
Sex Hormone Binding Glob.: 14 nmol/L (ref 10–50)
Testosterone, Free: 44.5 pg/mL (ref 35.0–155.0)
Testosterone,Total,LC/MS/MS: 217 ng/dL — ABNORMAL LOW (ref 250–1100)

## 2017-06-06 ENCOUNTER — Other Ambulatory Visit: Payer: Self-pay | Admitting: Internal Medicine

## 2017-06-06 DIAGNOSIS — M216X2 Other acquired deformities of left foot: Secondary | ICD-10-CM | POA: Diagnosis not present

## 2017-06-06 DIAGNOSIS — E119 Type 2 diabetes mellitus without complications: Secondary | ICD-10-CM | POA: Diagnosis not present

## 2017-06-06 DIAGNOSIS — M722 Plantar fascial fibromatosis: Secondary | ICD-10-CM | POA: Diagnosis not present

## 2017-07-30 ENCOUNTER — Other Ambulatory Visit: Payer: Self-pay | Admitting: Internal Medicine

## 2017-08-31 ENCOUNTER — Other Ambulatory Visit: Payer: Self-pay | Admitting: Internal Medicine

## 2017-09-02 ENCOUNTER — Telehealth: Payer: Self-pay | Admitting: Internal Medicine

## 2017-09-02 ENCOUNTER — Other Ambulatory Visit: Payer: Self-pay

## 2017-09-02 MED ORDER — METFORMIN HCL ER 500 MG PO TB24: 1000.0000 mg | ORAL_TABLET | Freq: Two times a day (BID) | ORAL | 0 refills | Status: DC

## 2017-09-02 NOTE — Telephone Encounter (Signed)
Copied from CRM [phone]. Topic: Quick Communication - See Telephone Encounter >> Sep 02, 2017 12:14 PM Guinevere FerrariMorris, Lareen Mullings E, NT wrote: CRM for notification. See Telephone encounter for: Kathlene NovemberMike from [facility] is calling to get a medication switched. Medication is omeprazole (PRILOSEC) 20 MG capsule. The generics that are covered are Nexium, aciphex and protonix. He would like a call back 09/02/17.

## 2017-09-02 NOTE — Telephone Encounter (Signed)
[facility] calling to see if Priolsec could be switched to a generic med( Nexium, Aciphex,Protonix) that would be covered.

## 2017-09-03 MED ORDER — PANTOPRAZOLE SODIUM 40 MG PO TBEC: 40.0000 mg | DELAYED_RELEASE_TABLET | Freq: Every day | ORAL | 3 refills | Status: DC

## 2017-09-03 NOTE — Telephone Encounter (Signed)
Okay with me if he is patient is okay.  We could use: Pantoprazole 40 mg 1 p.o. nightly a

## 2017-09-03 NOTE — Telephone Encounter (Signed)
Please advise 

## 2017-09-03 NOTE — Telephone Encounter (Signed)
Omeprazole 20mg  d/c and pantoprazole 40mg  1 tablet po daily sent to [facility].

## 2017-09-22 LAB — HM DIABETES EYE EXAM

## 2017-09-24 ENCOUNTER — Encounter: Payer: Self-pay | Admitting: Internal Medicine

## 2017-09-24 ENCOUNTER — Ambulatory Visit (INDEPENDENT_AMBULATORY_CARE_PROVIDER_SITE_OTHER): Payer: 59 | Admitting: Internal Medicine

## 2017-09-24 VITALS — BP 118/84 | HR 61 | Temp 98.2°F | Resp 14 | Ht 70.0 in | Wt 234.1 lb

## 2017-09-24 DIAGNOSIS — Z0000 Encounter for general adult medical examination without abnormal findings: Secondary | ICD-10-CM

## 2017-09-24 DIAGNOSIS — Z23 Encounter for immunization: Secondary | ICD-10-CM

## 2017-09-24 DIAGNOSIS — E119 Type 2 diabetes mellitus without complications: Secondary | ICD-10-CM | POA: Diagnosis not present

## 2017-09-24 LAB — BASIC METABOLIC PANEL
BUN: 17 mg/dL (ref 6–23)
CHLORIDE: 103 meq/L (ref 96–112)
CO2: 26 meq/L (ref 19–32)
CREATININE: 1 mg/dL (ref 0.40–1.50)
Calcium: 9.2 mg/dL (ref 8.4–10.5)
GFR: 85.74 mL/min (ref >60.00)
GLUCOSE: 118 mg/dL — AB (ref 70–99)
Potassium: 5 mEq/L (ref 3.5–5.1)
Sodium: 138 mEq/L (ref 135–145)

## 2017-09-24 LAB — LIPID PANEL
CHOL/HDL RATIO: 4
Cholesterol: 146 mg/dL (ref 0–200)
HDL: 40.6 mg/dL (ref >39.00)
NONHDL: 105.85
TRIGLYCERIDES: 211 mg/dL — AB (ref 0.0–149.0)
VLDL: 42.2 mg/dL — ABNORMAL HIGH (ref 0.0–40.0)

## 2017-09-24 LAB — TSH: TSH: 2.04 u[IU]/mL (ref 0.35–4.50)

## 2017-09-24 LAB — HEMOGLOBIN A1C: Hgb A1c MFr Bld: 7.9 % — ABNORMAL HIGH (ref 4.6–6.5)

## 2017-09-24 LAB — LDL CHOLESTEROL, DIRECT: Direct LDL: 88 mg/dL

## 2017-09-24 NOTE — Assessment & Plan Note (Addendum)
-  Td 2018; PNM 2014; Prevnar 2016;  Flu shot today - (+) FH CV Dz, on ASA , controlling CV RF -labs: BMP, FLP, A1c, TSH -Diet: Not doing well lately, his mother is sick and he is providing a lot of care to her. Also not exercising much due to lack of time.  Counseled.

## 2017-09-24 NOTE — Patient Instructions (Signed)
GO TO THE LAB : Get the blood work     GO TO THE FRONT DESK Schedule your next appointment for a checkup in 4 months  Glimepiride with your lunch   Diabetes: Check your blood sugar  once a day   at different times of the day  GOALS: Fasting before a meal 70- 130 2 hours after a meal less than 180

## 2017-09-24 NOTE — [facility] Notes (Signed)
Subjective:    Patient ID: Bradley Jefferson, male    DOB: 07/05/1971, 46 y.o.   MRN: [id number]  DOS:  09/24/2017 Type of visit - description : cpx Interval history: In addition to CPX we talk about other issues, see below  DM: Good compliance with medication, sometimes CBGs are low before his lunch in the 60s.  In the mornings fasting is usually 140, 150.   Review of Systems  Other than above, a 14 point review of systems is negative    Past Medical History:  Diagnosis Date  . Diabetes mellitus 03/2009  . GERD (gastroesophageal reflux disease)   . Low back pain    sees Chiro-herman    Past Surgical History:  Procedure Laterality Date  . abscess anal gland     s/p I&D    Social History   Socioeconomic History  . Marital status: Married    Spouse name: Not on file  . Number of children: 0  . Years of education: Not on file  . Highest education level: Not on file  Social Needs  . Financial resource strain: Not on file  . Food insecurity - worry: Not on file  . Food insecurity - inability: Not on file  . Transportation needs - medical: Not on file  . Transportation needs - non-medical: Not on file  Occupational History  . Occupation: manage a car parts department    Employer: [employer]  Tobacco Use  . Smoking status: Former Smoker    Last attempt to quit: 07/22/1999    Years since quitting: 18.1  . Smokeless tobacco: Never Used  Substance and Sexual Activity  . Alcohol use: Yes    Comment: socially   . Drug use: No  . Sexual activity: Not on file  Other Topics Concern  . Not on file  Social History Narrative   Lives w/ wife, she has h/o breast ca, also dx w/ H. Lymphoma ~ 03-2016, s/p chemo ,doing  ok as off 09-2017     Family History  Problem Relation Age of Onset  . Diabetes Father        Father brother (age 45)  . Hyperlipidemia Father   . CAD Father [id number]       first MI age 45  . Stroke Mother [id number]  . Colon cancer Neg Hx   . Colon polyps Neg Hx    . Prostate cancer Neg Hx      Allergies as of 09/24/2017   No Known Allergies     Medication List        Accurate as of 09/24/17  4:52 PM. Always use your most recent med list.          ALLEGRA PO Take 1 tablet by mouth daily. Reported on 01/05/2016   aspirin 81 MG tablet Take 1 tablet (81 mg total) by mouth daily.   docusate sodium 100 MG capsule Commonly known as:  COLACE Take 100 mg by mouth daily as needed for mild constipation.   fluticasone 50 MCG/ACT nasal spray Commonly known as:  FLONASE Place 1 spray into both nostrils daily. OTC   glimepiride 4 MG tablet Commonly known as:  AMARYL Take 4 mg by mouth daily with breakfast.   linagliptin 5 MG Tabs tablet Commonly known as:  TRADJENTA Take 1 tablet (5 mg total) by mouth daily.   losartan 100 MG tablet Commonly known as:  COZAAR TAKE 1 TABLET BY MOUTH ONCE DAILY   meloxicam 15 MG  tablet Commonly known as:  MOBIC Take 15 mg by mouth daily.   metFORMIN 500 MG 24 hr tablet Commonly known as:  GLUCOPHAGE-XR Take 2 tablets (1,000 mg total) by mouth 2 (two) times daily with a meal.   pantoprazole 40 MG tablet Commonly known as:  PROTONIX Take 1 tablet (40 mg total) by mouth daily before breakfast.   triamcinolone cream 0.5 % Commonly known as:  KENALOG Apply 1 application topically daily as needed.          Objective:   Physical Exam BP 118/84 (BP Location: Left Arm, Patient Position: Sitting, Cuff Size: Normal)   Pulse 61   Temp 98.2 F (36.8 C) (Oral)   Resp 14   Ht 5\' 10"  (1.778 m)   Wt 234 lb 2 oz (106.2 kg)   SpO2 96%   BMI 33.59 kg/m  General:   Well developed, well nourished . NAD.  Neck: No  thyromegaly  HEENT:  Normocephalic . Face symmetric, atraumatic Lungs:  CTA B Normal respiratory effort, no intercostal retractions, no accessory muscle use. Heart: RRR,  no murmur.  No pretibial edema bilaterally  Abdomen:  Not distended, soft, non-tender. No rebound or rigidity.     Skin: Exposed areas without rash. Not pale. Not jaundice DIABETIC FEET EXAM: No lower extremity edema Normal pedal pulses bilaterally Skin normal, nails normal, no calluses Pinprick examination of the feet normal. Neurologic:  alert & oriented X3.  Speech normal, gait appropriate for age and unassisted Strength symmetric and appropriate for age.  Psych: Cognition and judgment appear intact.  Cooperative with normal attention span and concentration.  Behavior appropriate. No anxious or depressed appearing.     Assessment & Plan:     Assessment   DM 2010, no neuropathy HTN dx 2-17 GERD Occasional low back pain, sees a chiropractor. Occasional increase LFTs: Hepatitis B and C negative 12-2016 Ear eczema: Occasionally uses triamcinolone.  PLAN  DM: Foot exam negative, currently on metformin, Tradjenta, glimepiride 4 mg instead of 6 because he was having low sugars. CBGs in the morning in the 150s, at noon sometimes low. Plan: Check A1c, continue same medications, move glimepiride intake to lunchtime ( his bigger meal of the day) HTN: Continue losartan Plantar fasciitis: On Mobic daily, risk of daily NSAIDs discussed, try to decrease mobic to prn (2 or 3 times a week) .  Stretch daily to help plantar fasciitis which is already better. Mild constipation, on Colace daily OTC. RTC 4 months

## 2017-09-24 NOTE — Assessment & Plan Note (Signed)
PLAN  DM: Foot exam negative, currently on metformin, Tradjenta, glimepiride 4 mg instead of 6 because he was having low sugars. CBGs in the morning in the 150s, at noon sometimes low. Plan: Check A1c, continue same medications, move glimepiride intake to lunchtime ( his bigger meal of the day) HTN: Continue losartan Plantar fasciitis: On Mobic daily, risk of daily NSAIDs discussed, try to decrease mobic to prn (2 or 3 times a week) .  Stretch daily to help plantar fasciitis which is already better. Mild constipation, on Colace daily OTC. RTC 4 months

## 2017-09-24 NOTE — Progress Notes (Signed)
Pre visit review using our clinic review tool, if applicable. No additional management support is needed unless otherwise documented below in the visit note. 

## 2017-10-24 ENCOUNTER — Other Ambulatory Visit: Payer: Self-pay | Admitting: Internal Medicine

## 2017-11-29 ENCOUNTER — Other Ambulatory Visit: Payer: Self-pay | Admitting: Internal Medicine

## 2017-12-02 ENCOUNTER — Telehealth: Payer: 59 | Admitting: Family

## 2017-12-02 DIAGNOSIS — R05 Cough: Secondary | ICD-10-CM

## 2017-12-02 DIAGNOSIS — R059 Cough, unspecified: Secondary | ICD-10-CM

## 2017-12-02 MED ORDER — PREDNISONE 10 MG (21) PO TBPK: ORAL_TABLET | ORAL | 0 refills | Status: DC

## 2017-12-02 MED ORDER — AZITHROMYCIN 250 MG PO TABS
ORAL_TABLET | ORAL | 0 refills | Status: DC
Start: 2017-12-02 — End: 2017-12-10

## 2017-12-02 NOTE — [facility] Notes (Signed)
We are sorry that you are not feeling well.  Here is how we plan to help!  Based on your presentation I believe you most likely have A cough due to bacteria.  When patients have a fever and a productive cough with a change in color or increased sputum production, we are concerned about bacterial bronchitis.  If left untreated it can progress to pneumonia.  If your symptoms do not improve with your treatment plan it is important that you contact your provider.   I have prescribed Azithromyin 250 mg: two tablets now and then one tablet daily for 4 additonal days    In addition you may use A non-prescription cough medication called Robitussin DAC. Take 2 teaspoons every 8 hours or Delsym: take 2 teaspoons every 12 hours.  Sterapred 10 mg dosepak  From your responses in the eVisit questionnaire you describe inflammation in the upper respiratory tract which is causing a significant cough.  This is commonly called Bronchitis and has four common causes:    Allergies  Viral Infections  Acid Reflux  Bacterial Infection Allergies, viruses and acid reflux are treated by controlling symptoms or eliminating the cause. An example might be a cough caused by taking certain blood pressure medications. You stop the cough by changing the medication. Another example might be a cough caused by acid reflux. Controlling the reflux helps control the cough.  USE OF BRONCHODILATOR ("RESCUE") INHALERS: There is a risk from using your bronchodilator too frequently.  The risk is that over-reliance on a medication which only relaxes the muscles surrounding the breathing tubes can reduce the effectiveness of medications prescribed to reduce swelling and congestion of the tubes themselves.  Although you feel brief relief from the bronchodilator inhaler, your asthma may actually be worsening with the tubes becoming more swollen and filled with mucus.  This can delay other crucial treatments, such as oral steroid medications. If  you need to use a bronchodilator inhaler daily, several times per day, you should discuss this with your provider.  There are probably better treatments that could be used to keep your asthma under control.     HOME CARE . Only take medications as instructed by your medical team. . Complete the entire course of an antibiotic. . Drink plenty of fluids and get plenty of rest. . Avoid close contacts especially the very young and the elderly . Cover your mouth if you cough or cough into your sleeve. . Always remember to wash your hands . A steam or ultrasonic humidifier can help congestion.   GET HELP RIGHT AWAY IF: . You develop worsening fever. . You become short of breath . You cough up blood. . Your symptoms persist after you have completed your treatment plan MAKE SURE YOU   Understand these instructions.  Will watch your condition.  Will get help right away if you are not doing well or get worse.  Your e-visit answers were reviewed by a board certified advanced clinical practitioner to complete your personal care plan.  Depending on the condition, your plan could have included both over the counter or prescription medications. If there is a problem please reply  once you have received a response from your provider. Your safety is important to us.  If you have drug allergies check your prescription carefully.    You can use MyChart to ask questions about today's visit, request a non-urgent call back, or ask for a work or school excuse for 24 hours related to this e-Visit.   If it has been greater than 24 hours you will need to follow up with your provider, or enter a new e-Visit to address those concerns. You will get an e-mail in the next two days asking about your experience.  I hope that your e-visit has been valuable and will speed your recovery. Thank you for using e-visits.  

## 2017-12-03 ENCOUNTER — Encounter: Payer: Self-pay | Admitting: Internal Medicine

## 2017-12-04 ENCOUNTER — Other Ambulatory Visit: Payer: Self-pay | Admitting: Internal Medicine

## 2017-12-04 MED ORDER — HYDROCODONE-HOMATROPINE 5-1.5 MG/5ML PO SYRP: 5.0000 mL | ORAL_SOLUTION | Freq: Every evening | ORAL | 0 refills | Status: DC | PRN

## 2017-12-10 ENCOUNTER — Ambulatory Visit: Payer: 59 | Admitting: Internal Medicine

## 2017-12-10 ENCOUNTER — Encounter: Payer: Self-pay | Admitting: Internal Medicine

## 2017-12-10 VITALS — BP 132/78 | HR 65 | Temp 97.6°F | Resp 16 | Ht 70.0 in | Wt 230.5 lb

## 2017-12-10 DIAGNOSIS — R05 Cough: Secondary | ICD-10-CM | POA: Diagnosis not present

## 2017-12-10 DIAGNOSIS — R059 Cough, unspecified: Secondary | ICD-10-CM

## 2017-12-10 MED ORDER — HYDROCODONE-HOMATROPINE 5-1.5 MG/5ML PO SYRP: 5.0000 mL | ORAL_SOLUTION | Freq: Two times a day (BID) | ORAL | 0 refills | Status: DC | PRN

## 2017-12-10 MED ORDER — BENZONATATE 200 MG PO CAPS: 200.0000 mg | ORAL_CAPSULE | Freq: Three times a day (TID) | ORAL | 0 refills | Status: DC | PRN

## 2017-12-10 MED ORDER — AZELASTINE HCL 0.1 % NA SOLN: 2.0000 | Freq: Two times a day (BID) | NASAL | 6 refills | Status: DC

## 2017-12-10 NOTE — Patient Instructions (Signed)
Continue Flonase  Start Astelin 2 sprays on each side of the nose twice a day  For cough control: Mucinex DM 1 tablet twice a day as needed Tessalon Perles every 8 hours as needed Hydrocodone 1 or 2 teaspoons at bedtime  Call if not gradually better

## 2017-12-10 NOTE — Progress Notes (Signed)
Pre visit review using our clinic review tool, if applicable. No additional management support is needed unless otherwise documented below in the visit note. 

## 2017-12-10 NOTE — [facility] Notes (Signed)
Subjective:    Patient ID: Bradley Jefferson, male    DOB: 05-09-1971, 47 y.o.   MRN: [id number]  DOS:  12/10/2017 Type of visit - description : acute Interval history: Symptoms of started approximately 11/25/2017 with a URI characterized by cough, some postnasal dripping. He a lot of sputum, green in color on 12/02/2017, he had a E visit, was Rx a Z-Pak and prednisone, the next day the cough continue and I prescribed hydrocodone. He is here because he continue with severe cough, very persistent and intense. He finished antibiotic and prednisone without apparent help Hydrocodone does help but he requires 2 to 3 teaspoons at night.   Review of Systems Denies fever chills at any time during this illness. No GERD symptoms Admits to postnasal dripping No history of asthma or tobacco abuse however when he cough intensely he hears some wheezing. No hemoptysis.  Past Medical History:  Diagnosis Date  . Diabetes mellitus 03/2009  . GERD (gastroesophageal reflux disease)   . Low back pain    sees Chiro-herman    Past Surgical History:  Procedure Laterality Date  . abscess anal gland     s/p I&D    Social History   Socioeconomic History  . Marital status: Married    Spouse name: Not on file  . Number of children: 0  . Years of education: Not on file  . Highest education level: Not on file  Social Needs  . Financial resource strain: Not on file  . Food insecurity - worry: Not on file  . Food insecurity - inability: Not on file  . Transportation needs - medical: Not on file  . Transportation needs - non-medical: Not on file  Occupational History  . Occupation: manage a car parts department    Employer: [employer]  Tobacco Use  . Smoking status: Former Smoker    Last attempt to quit: 07/22/1999    Years since quitting: 18.4  . Smokeless tobacco: Never Used  Substance and Sexual Activity  . Alcohol use: Yes    Comment: socially   . Drug use: No  . Sexual activity: Not  on file  Other Topics Concern  . Not on file  Social History Narrative   Lives w/ wife, she has h/o breast ca, also dx w/ H. Lymphoma ~ 03-2016, s/p chemo ,doing  ok as off 09-2017      Allergies as of 12/10/2017   No Known Allergies     Medication List        Accurate as of 12/10/17 11:59 PM. Always use your most recent med list.          ALLEGRA PO Take 1 tablet by mouth daily. Reported on 01/05/2016   aspirin 81 MG tablet Take 1 tablet (81 mg total) by mouth daily.   azelastine 0.1 % nasal spray Commonly known as:  ASTELIN Place 2 sprays into both nostrils 2 (two) times daily.   benzonatate 200 MG capsule Commonly known as:  TESSALON Take 1 capsule (200 mg total) by mouth 3 (three) times daily as needed for cough.   docusate sodium 100 MG capsule Commonly known as:  COLACE Take 100 mg by mouth daily as needed for mild constipation.   fluticasone 50 MCG/ACT nasal spray Commonly known as:  FLONASE Place 1 spray into both nostrils daily. OTC   glimepiride 4 MG tablet Commonly known as:  AMARYL Take 1 tablet (4 mg total) by mouth daily with breakfast.   HYDROcodone-homatropine  5-1.5 MG/5ML syrup Commonly known as:  HYCODAN Take 5 mLs by mouth 2 (two) times daily as needed for cough.   linagliptin 5 MG Tabs tablet Commonly known as:  TRADJENTA Take 1 tablet (5 mg total) by mouth daily.   losartan 100 MG tablet Commonly known as:  COZAAR Take 1 tablet (100 mg total) by mouth daily.   meloxicam 15 MG tablet Commonly known as:  MOBIC Take 15 mg by mouth daily.   metFORMIN 500 MG 24 hr tablet Commonly known as:  GLUCOPHAGE-XR Take 2 tablets (1,000 mg total) by mouth 2 (two) times daily with a meal.   pantoprazole 40 MG tablet Commonly known as:  PROTONIX Take 1 tablet (40 mg total) by mouth daily before breakfast.   triamcinolone cream 0.5 % Commonly known as:  KENALOG Apply 1 application topically daily as needed.          Objective:   Physical  Exam BP 132/78 (BP Location: Left Arm, Patient Position: Sitting, Cuff Size: Normal)   Pulse 65   Temp 97.6 F (36.4 C) (Oral)   Resp 16   Ht 5\' 10"  (1.778 m)   Wt 230 lb 8 oz (104.6 kg)   SpO2 96%   BMI 33.07 kg/m  General:   Well developed, well nourished . NAD.  HEENT:  Normocephalic . Face symmetric, atraumatic. TMs slightly bulged, throat symmetric not red, nose with minimal congestion. Lungs:  CTA B Normal respiratory effort, no intercostal retractions, no accessory muscle use. Heart: RRR,  no murmur.  No pretibial edema bilaterally  Skin: Not pale. Not jaundice Neurologic:  alert & oriented X3.  Speech normal, gait appropriate for age and unassisted Psych--  Cognition and judgment appear intact.  Cooperative with normal attention span and concentration.  Behavior appropriate. No anxious or depressed appearing.      Assessment & Plan:   Assessment   DM 2010, no neuropathy HTN dx 2-17 GERD Occasional low back pain, sees a chiropractor. Occasional increase LFTs: Hepatitis B and C negative 12-2016 Ear eczema: Occasionally uses triamcinolone.  PLAN  Cough: Persistent cough after URI, s/p Z-Pak, prednisone. ROS + postnasal dripping, question of wheezing. Most likely a post viral cough, recommend to continue Mucinex DM, add Tessalon Perles, refill hydrocodone. Control PND with Astelin, continue with Flonase If not gradually better, will consider a trial with inhaler (?of wheezing), ?chest x-ray ,? doxycycline.  Patient to let me know how he is doing.

## 2017-12-11 NOTE — Assessment & Plan Note (Signed)
Cough: Persistent cough after URI, s/p Z-Pak, prednisone. ROS + postnasal dripping, question of wheezing. Most likely a post viral cough, recommend to continue Mucinex DM, add Tessalon Perles, refill hydrocodone. Control PND with Astelin, continue with Flonase If not gradually better, will consider a trial with inhaler (?of wheezing), ?chest x-ray ,? doxycycline.  Patient to let me know how he is doing.

## 2018-01-26 ENCOUNTER — Ambulatory Visit (INDEPENDENT_AMBULATORY_CARE_PROVIDER_SITE_OTHER): Payer: 59 | Admitting: Internal Medicine

## 2018-01-26 ENCOUNTER — Encounter: Payer: Self-pay | Admitting: Internal Medicine

## 2018-01-26 VITALS — BP 124/72 | HR 61 | Temp 98.0°F | Resp 14 | Ht 70.0 in | Wt 232.1 lb

## 2018-01-26 DIAGNOSIS — E875 Hyperkalemia: Secondary | ICD-10-CM

## 2018-01-26 DIAGNOSIS — L97509 Non-pressure chronic ulcer of other part of unspecified foot with unspecified severity: Secondary | ICD-10-CM | POA: Diagnosis not present

## 2018-01-26 DIAGNOSIS — E785 Hyperlipidemia, unspecified: Secondary | ICD-10-CM | POA: Diagnosis not present

## 2018-01-26 DIAGNOSIS — E11621 Type 2 diabetes mellitus with foot ulcer: Secondary | ICD-10-CM | POA: Diagnosis not present

## 2018-01-26 LAB — BASIC METABOLIC PANEL
BUN: 16 mg/dL (ref 6–23)
CHLORIDE: 102 meq/L (ref 96–112)
CO2: 27 meq/L (ref 19–32)
CREATININE: 1.18 mg/dL (ref 0.40–1.50)
Calcium: 9.7 mg/dL (ref 8.4–10.5)
GFR: 70.73 mL/min (ref >60.00)
Glucose, Bld: 181 mg/dL — ABNORMAL HIGH (ref 70–99)
POTASSIUM: 5.3 meq/L — AB (ref 3.5–5.1)
Sodium: 140 mEq/L (ref 135–145)

## 2018-01-26 LAB — HEMOGLOBIN A1C: Hgb A1c MFr Bld: 8.5 % — ABNORMAL HIGH (ref 4.6–6.5)

## 2018-01-26 LAB — ALT: ALT: 55 U/L — ABNORMAL HIGH (ref 0–53)

## 2018-01-26 LAB — AST: AST: 24 U/L (ref 0–37)

## 2018-01-26 NOTE — Assessment & Plan Note (Signed)
DM: Good compliance with glimepiride, metformin,and Tradjenta.  Last A1c 7.9, timing of glimepiride changed to noon, the biggest meal of the day.  Recheck a A1c and BMP.  Depending on results , add SGLT2 ?    He is taking care of his elderly parents, has no much time to improve his lifestyle. RTC 09/2018 CPX

## 2018-01-26 NOTE — Progress Notes (Signed)
Pre visit review using our clinic review tool, if applicable. No additional management support is needed unless otherwise documented below in the visit note. 

## 2018-01-26 NOTE — Progress Notes (Signed)
Subjective:    Patient ID: Bradley Jefferson, male    DOB: 11/23/1970, 47 y.o.   MRN: [id number]  DOS:  01/26/2018 Type of visit - description : f/u  Interval history: f/u Was seen recently with cough: Symptoms resolved Having some allergies, managing well with OTCs. Good compliance with medications, no ambulatory CBGs or ambulatory BPs   Review of Systems Very busy, taking care of his elderly parents, does not have time to exercise. Denies low sugar symptoms. Denies any abdominal pain, change in the color of the stools, nausea or vomiting.  Past Medical History:  Diagnosis Date  . Diabetes mellitus 03/2009  . GERD (gastroesophageal reflux disease)   . Low back pain    sees Chiro-herman    Past Surgical History:  Procedure Laterality Date  . abscess anal gland     s/p I&D    Social History   Socioeconomic History  . Marital status: Married    Spouse name: Not on file  . Number of children: 0  . Years of education: Not on file  . Highest education level: Not on file  Occupational History  . Occupation: manage a car parts department    Employer: [employer]  Social Needs  . Financial resource strain: Not on file  . Food insecurity:    Worry: Not on file    Inability: Not on file  . Transportation needs:    Medical: Not on file    Non-medical: Not on file  Tobacco Use  . Smoking status: Former Smoker    Last attempt to quit: 07/22/1999    Years since quitting: 18.5  . Smokeless tobacco: Never Used  Substance and Sexual Activity  . Alcohol use: Yes    Comment: socially   . Drug use: No  . Sexual activity: Not on file  Lifestyle  . Physical activity:    Days per week: Not on file    Minutes per session: Not on file  . Stress: Not on file  Relationships  . Social connections:    Talks on phone: Not on file    Gets together: Not on file    Attends religious service: Not on file    Active member of club or organization: Not on file    Attends meetings  of clubs or organizations: Not on file    Relationship status: Not on file  . Intimate partner violence:    Fear of current or ex partner: Not on file    Emotionally abused: Not on file    Physically abused: Not on file    Forced sexual activity: Not on file  Other Topics Concern  . Not on file  Social History Narrative   Lives w/ wife, she has h/o breast ca, also dx w/ H. Lymphoma ~ 03-2016, s/p chemo ,doing  ok as off 09-2017      Allergies as of 01/26/2018   No Known Allergies     Medication List        Accurate as of 01/26/18  5:03 PM. Always use your most recent med list.          ALLEGRA PO Take 1 tablet by mouth daily. Reported on 01/05/2016   aspirin 81 MG tablet Take 1 tablet (81 mg total) by mouth daily.   docusate sodium 100 MG capsule Commonly known as:  COLACE Take 100 mg by mouth daily as needed for mild constipation.   fluticasone 50 MCG/ACT nasal spray Commonly known as:  FLONASE Place 1 spray into both nostrils daily. OTC   glimepiride 4 MG tablet Commonly known as:  AMARYL Take 1 tablet (4 mg total) by mouth daily with breakfast.   linagliptin 5 MG Tabs tablet Commonly known as:  TRADJENTA Take 1 tablet (5 mg total) by mouth daily.   losartan 100 MG tablet Commonly known as:  COZAAR Take 1 tablet (100 mg total) by mouth daily.   meloxicam 15 MG tablet Commonly known as:  MOBIC Take 15 mg by mouth daily.   metFORMIN 500 MG 24 hr tablet Commonly known as:  GLUCOPHAGE-XR Take 2 tablets (1,000 mg total) by mouth 2 (two) times daily with a meal.   pantoprazole 40 MG tablet Commonly known as:  PROTONIX Take 1 tablet (40 mg total) by mouth daily before breakfast.          Objective:   Physical Exam BP 124/72 (BP Location: Left Arm, Patient Position: Sitting, Cuff Size: Normal)   Pulse 61   Temp 98 F (36.7 C) (Oral)   Resp 14   Ht 5\' 10"  (1.778 m)   Wt 232 lb 2 oz (105.3 kg)   SpO2 97%   BMI 33.31 kg/m  General:   Well developed,  well nourished . NAD.  HEENT:  Normocephalic . Face symmetric, atraumatic Lungs:  CTA B Normal respiratory effort, no intercostal retractions, no accessory muscle use. Heart: RRR,  no murmur.  No pretibial edema bilaterally  Skin: Not pale. Not jaundice Neurologic:  alert & oriented X3.  Speech normal, gait appropriate for age and unassisted Psych--  Cognition and judgment appear intact.  Cooperative with normal attention span and concentration.  Behavior appropriate. No anxious or depressed appearing.      Assessment & Plan:   Assessment   DM 2010, no neuropathy HTN dx 2-17 GERD Occasional low back pain, sees a chiropractor. Occasional increase LFTs: Hepatitis B and C negative 12-2016 Ear eczema: Occasionally uses triamcinolone.  PLAN  DM: Good compliance with glimepiride, metformin,and Tradjenta.  Last A1c 7.9, timing of glimepiride changed to noon, the biggest meal of the day.  Recheck a A1c and BMP.  Depending on results , add SGLT2 ?    He is taking care of his elderly parents, has no much time to improve his lifestyle. RTC 09/2018 CPX

## 2018-01-26 NOTE — Patient Instructions (Signed)
GO TO THE LAB : Get the blood work     GO TO THE FRONT DESK Schedule your next appointment for a   yearly checkup by December 2019

## 2018-01-28 MED ORDER — DAPAGLIFLOZIN PROPANEDIOL 5 MG PO TABS: 5.0000 mg | ORAL_TABLET | Freq: Every day | ORAL | 3 refills | Status: DC

## 2018-01-28 NOTE — Addendum Note (Signed)
Addended byConrad Avenel: Aaryn Sermon D on: 01/28/2018 01:45 PM   Modules accepted: Orders

## 2018-02-09 ENCOUNTER — Other Ambulatory Visit (INDEPENDENT_AMBULATORY_CARE_PROVIDER_SITE_OTHER): Payer: 59

## 2018-02-09 DIAGNOSIS — E875 Hyperkalemia: Secondary | ICD-10-CM | POA: Diagnosis not present

## 2018-02-09 LAB — BASIC METABOLIC PANEL
BUN: 18 mg/dL (ref 6–23)
CALCIUM: 9.2 mg/dL (ref 8.4–10.5)
CHLORIDE: 102 meq/L (ref 96–112)
CO2: 24 mEq/L (ref 19–32)
CREATININE: 1.03 mg/dL (ref 0.40–1.50)
GFR: 82.73 mL/min (ref >60.00)
Glucose, Bld: 150 mg/dL — ABNORMAL HIGH (ref 70–99)
Potassium: 4 mEq/L (ref 3.5–5.1)
Sodium: 138 mEq/L (ref 135–145)

## 2018-02-26 ENCOUNTER — Other Ambulatory Visit: Payer: Self-pay | Admitting: Internal Medicine

## 2018-02-26 ENCOUNTER — Telehealth: Payer: Self-pay | Admitting: Internal Medicine

## 2018-02-26 NOTE — Telephone Encounter (Signed)
Will discuss with patient on RTC

## 2018-02-26 NOTE — Telephone Encounter (Signed)
Please advise 

## 2018-02-26 NOTE — Telephone Encounter (Signed)
Copied from CRM [id number]. Topic: Quick Communication - See Telephone Encounter >> Feb 26, 2018 10:07 AM Windy Kalata, NT wrote: CRM for notification. See Telephone encounter for: 02/26/18.  Kathlene November is calling from [facility] [place] and would like to know if Dr. Drue Novel wanted the patient to be on statin. Please advise. Cb# [phone]

## 2018-02-26 NOTE — Telephone Encounter (Signed)
Noted, will inform pharmacy.

## 2018-03-18 ENCOUNTER — Other Ambulatory Visit: Payer: Self-pay | Admitting: Internal Medicine

## 2018-04-05 ENCOUNTER — Encounter: Payer: Self-pay | Admitting: Internal Medicine

## 2018-04-06 ENCOUNTER — Other Ambulatory Visit: Payer: Self-pay | Admitting: Internal Medicine

## 2018-04-06 MED ORDER — SCOPOLAMINE 1 MG/3DAYS TD PT72: 1.0000 | MEDICATED_PATCH | TRANSDERMAL | 0 refills | Status: DC

## 2018-04-18 ENCOUNTER — Other Ambulatory Visit: Payer: Self-pay | Admitting: Internal Medicine

## 2018-04-20 ENCOUNTER — Other Ambulatory Visit: Payer: Self-pay | Admitting: Internal Medicine

## 2018-04-22 ENCOUNTER — Telehealth: Payer: Self-pay | Admitting: Internal Medicine

## 2018-04-22 NOTE — Telephone Encounter (Signed)
Rx sent, uses for occasional ear eczema

## 2018-04-22 NOTE — Telephone Encounter (Signed)
Received refill request for Kenalog 0.5% cream. No longer on med list. Please advise.

## 2018-04-26 ENCOUNTER — Encounter: Payer: Self-pay | Admitting: Internal Medicine

## 2018-04-27 ENCOUNTER — Telehealth: Payer: Self-pay

## 2018-04-27 ENCOUNTER — Encounter: Payer: Self-pay | Admitting: Internal Medicine

## 2018-04-27 NOTE — Telephone Encounter (Signed)
PA initiated via Covermymeds; KEY: [id number]. Awaiting determination.

## 2018-04-28 NOTE — Telephone Encounter (Signed)
PA approved. Effective 04/27/2018 to 04/28/2019.

## 2018-05-21 ENCOUNTER — Telehealth: Payer: Self-pay | Admitting: Internal Medicine

## 2018-05-21 NOTE — Telephone Encounter (Signed)
PA initiated via Covermymeds; KEY: [id number]. Awaiting determination.

## 2018-05-21 NOTE — Telephone Encounter (Signed)
PA approved. Effective 05/21/2018 to 05/22/2019.

## 2018-05-21 NOTE — Telephone Encounter (Signed)
Copied from CRM [phone]. Topic: General - Other >> May 21, 2018  9:30 AM [name], Bradley Jefferson: Reason for CRM: [facility] [place] [facility] [place]. The pharm received a denial on pantoprazole. This medication needs PA [id number] [phone]

## 2018-06-01 ENCOUNTER — Other Ambulatory Visit: Payer: Self-pay

## 2018-06-01 MED ORDER — PANTOPRAZOLE SODIUM 40 MG PO TBEC: 40.0000 mg | DELAYED_RELEASE_TABLET | Freq: Every day | ORAL | 3 refills | Status: DC

## 2018-06-03 ENCOUNTER — Encounter: Payer: Self-pay | Admitting: Internal Medicine

## 2018-06-17 ENCOUNTER — Other Ambulatory Visit: Payer: Self-pay | Admitting: Internal Medicine

## 2018-06-25 ENCOUNTER — Encounter: Payer: Self-pay | Admitting: Internal Medicine

## 2018-06-25 ENCOUNTER — Other Ambulatory Visit: Payer: Self-pay | Admitting: Internal Medicine

## 2018-06-25 MED ORDER — BENZONATATE 200 MG PO CAPS: 200.0000 mg | ORAL_CAPSULE | Freq: Three times a day (TID) | ORAL | 0 refills | Status: DC | PRN

## 2018-07-03 ENCOUNTER — Encounter: Payer: Self-pay | Admitting: Internal Medicine

## 2018-07-03 ENCOUNTER — Ambulatory Visit: Payer: 59 | Admitting: Internal Medicine

## 2018-07-03 VITALS — BP 128/84 | HR 63 | Temp 98.3°F | Resp 16 | Ht 70.0 in | Wt 224.5 lb

## 2018-07-03 DIAGNOSIS — E119 Type 2 diabetes mellitus without complications: Secondary | ICD-10-CM | POA: Diagnosis not present

## 2018-07-03 DIAGNOSIS — J069 Acute upper respiratory infection, unspecified: Secondary | ICD-10-CM | POA: Diagnosis not present

## 2018-07-03 DIAGNOSIS — I10 Essential (primary) hypertension: Secondary | ICD-10-CM

## 2018-07-03 LAB — BASIC METABOLIC PANEL
BUN: 19 mg/dL (ref 6–23)
CALCIUM: 9.8 mg/dL (ref 8.4–10.5)
CHLORIDE: 103 meq/L (ref 96–112)
CO2: 30 meq/L (ref 19–32)
CREATININE: 1.11 mg/dL (ref 0.40–1.50)
GFR: 75.75 mL/min (ref >60.00)
GLUCOSE: 127 mg/dL — AB (ref 70–99)
Potassium: 4.6 mEq/L (ref 3.5–5.1)
Sodium: 139 mEq/L (ref 135–145)

## 2018-07-03 LAB — HEMOGLOBIN A1C: Hgb A1c MFr Bld: 7.2 % — ABNORMAL HIGH (ref 4.6–6.5)

## 2018-07-03 MED ORDER — HYDROCODONE-HOMATROPINE 5-1.5 MG/5ML PO SYRP: 5.0000 mL | ORAL_SOLUTION | Freq: Every evening | ORAL | 0 refills | Status: DC | PRN

## 2018-07-03 NOTE — Progress Notes (Signed)
Pre visit review using our clinic review tool, if applicable. No additional management support is needed unless otherwise documented below in the visit note. 

## 2018-07-03 NOTE — Progress Notes (Signed)
Subjective:    Patient ID: Bradley Jefferson, male    DOB: 14-Oct-1970, 47 y.o.   MRN: [id number]  DOS:  07/03/2018 Type of visit - description :  Interval history: -DM: Since the last office visit, we added a medication, he is doing better with diet, has lost some weight, CBGs morning 103. -2 weeks ago he got sick with cough and chest congestion. Taking OTCs, overall symptoms decreasing.  Still has cough that keeps him awake some yellow sputum production. -HTN: No ambulatory BPs  Wt Readings from Last 3 Encounters:  07/03/18 224 lb 8 oz (101.8 kg)  01/26/18 232 lb 2 oz (105.3 kg)  12/10/17 230 lb 8 oz (104.6 kg)     Review of Systems  Denies fever chills.  Had some subjective fever early with respiratory symptoms. No nausea, vomiting, diarrhea. No unusual aches or pains Mild sinus congestion. Past Medical History:  Diagnosis Date  . Diabetes mellitus 03/2009  . GERD (gastroesophageal reflux disease)   . Low back pain    sees Chiro-herman    Past Surgical History:  Procedure Laterality Date  . abscess anal gland     s/p I&D    Social History   Socioeconomic History  . Marital status: Married    Spouse name: Not on file  . Number of children: 0  . Years of education: Not on file  . Highest education level: Not on file  Occupational History  . Occupation: manage a car parts department    Employer: [employer]  Social Needs  . Financial resource strain: Not on file  . Food insecurity:    Worry: Not on file    Inability: Not on file  . Transportation needs:    Medical: Not on file    Non-medical: Not on file  Tobacco Use  . Smoking status: Former Smoker    Last attempt to quit: 07/22/1999    Years since quitting: 18.9  . Smokeless tobacco: Never Used  Substance and Sexual Activity  . Alcohol use: Yes    Comment: socially   . Drug use: No  . Sexual activity: Not on file  Lifestyle  . Physical activity:    Days per week: Not on file    Minutes per  session: Not on file  . Stress: Not on file  Relationships  . Social connections:    Talks on phone: Not on file    Gets together: Not on file    Attends religious service: Not on file    Active member of club or organization: Not on file    Attends meetings of clubs or organizations: Not on file    Relationship status: Not on file  . Intimate partner violence:    Fear of current or ex partner: Not on file    Emotionally abused: Not on file    Physically abused: Not on file    Forced sexual activity: Not on file  Other Topics Concern  . Not on file  Social History Narrative   Lives w/ wife, she has h/o breast ca, also dx w/ H. Lymphoma ~ 03-2016, s/p chemo ,doing  ok as off 09-2017      Allergies as of 07/03/2018   No Known Allergies     Medication List        Accurate as of 07/03/18 11:59 PM. Always use your most recent med list.          ALLEGRA PO Take 1 tablet by mouth  daily. Reported on 01/05/2016   aspirin 81 MG tablet Take 1 tablet (81 mg total) by mouth daily.   benzonatate 200 MG capsule Commonly known as:  TESSALON Take 1 capsule (200 mg total) by mouth 3 (three) times daily as needed for cough.   docusate sodium 100 MG capsule Commonly known as:  COLACE Take 100 mg by mouth daily as needed for mild constipation.   fluticasone 50 MCG/ACT nasal spray Commonly known as:  FLONASE Place 1 spray into both nostrils daily. OTC   glimepiride 4 MG tablet Commonly known as:  AMARYL TAKE 1 TABLET (4 MG TOTAL) BY MOUTH DAILY WITH BREAKFAST   HYDROcodone-homatropine 5-1.5 MG/5ML syrup Commonly known as:  HYCODAN Take 5 mLs by mouth at bedtime as needed for cough.   linagliptin 5 MG Tabs tablet Commonly known as:  TRADJENTA Take 1 tablet (5 mg total) by mouth daily.   losartan 100 MG tablet Commonly known as:  COZAAR Take 1 tablet (100 mg total) by mouth daily.   meloxicam 15 MG tablet Commonly known as:  MOBIC Take 15 mg by mouth daily.   metFORMIN 500  MG 24 hr tablet Commonly known as:  GLUCOPHAGE-XR Take 2 tablets (1,000 mg total) by mouth 2 (two) times daily with a meal.   pantoprazole 40 MG tablet Commonly known as:  PROTONIX Take 1 tablet (40 mg total) by mouth daily before breakfast.   triamcinolone cream 0.5 % Commonly known as:  KENALOG APPLY 1 APPLICATION TOPICALLY DAILY AS NEEDED.          Objective:   Physical Exam BP 128/84 (BP Location: Right Arm, Patient Position: Sitting, Cuff Size: Small)   Pulse 63   Temp 98.3 F (36.8 C) (Oral)   Resp 16   Ht 5\' 10"  (1.778 m)   Wt 224 lb 8 oz (101.8 kg)   SpO2 97%   BMI 32.21 kg/m  General:   Well developed, NAD, see BMI.  HEENT:  Normocephalic . Face symmetric, atraumatic.  TMs are slightly bulge.  No red.  Nose congested, sinuses no TTP.  Throat symmetric. Lungs:  CTA B Normal respiratory effort, no intercostal retractions, no accessory muscle use. Heart: RRR,  no murmur.  No pretibial edema bilaterally  Skin: Not pale. Not jaundice Neurologic:  alert & oriented X3.  Speech normal, gait appropriate for age and unassisted Psych--  Cognition and judgment appear intact.  Cooperative with normal attention span and concentration.  Behavior appropriate. No anxious or depressed appearing.      Assessment & Plan:  Assessment   DM 2010, no neuropathy HTN dx 2-17 GERD Occasional low back pain, sees a chiropractor. Occasional increase LFTs: Hepatitis B and C negative 12-2016 Ear eczema: Occasionally uses triamcinolone.  PLAN  ZO:XWRU A1c was 8.5 while he was taking glimepiride, metformin and Tradjenta.  He was reluctant to add farxiga.  Has improved his lifestyle and has lost weight.  CBG this morning 103.  Check a A1c.  He will consider adding Farxiga or similar medication. Cholesterol medication?  We will recheck a FLP on RTC. HTN: Had hyperkalemia at the last visit, recheck potassium was wnl, check a BMP today, continue losartan. URI: Overall getting slightly  better, recommend to continue with Flonase, Mucinex, and a low-dose of hydrocodone to help with nocturnal cough.  Call if not better.   RTC 3 -4 months CPX

## 2018-07-03 NOTE — Patient Instructions (Addendum)
GO TO THE LAB : Get the blood work     GO TO THE FRONT DESK Schedule your next appointment for a physical exam in 3 to 4 months  Continue Flonase, go back on Mucinex DM until you are completely well. Also take hydrocodone at bedtime to help with nocturnal cough. Will make you drowsy. Call if not much better in 7 to 10 days.

## 2018-07-05 DIAGNOSIS — I10 Essential (primary) hypertension: Secondary | ICD-10-CM

## 2018-07-05 HISTORY — DX: Essential (primary) hypertension: I10

## 2018-07-05 NOTE — Assessment & Plan Note (Signed)
JW:JXBJ A1c was 8.5 while he was taking glimepiride, metformin and Tradjenta.  He was reluctant to add farxiga.  Has improved his lifestyle and has lost weight.  CBG this morning 103.  Check a A1c.  He will consider adding Farxiga or similar medication. Cholesterol medication?  We will recheck a FLP on RTC. HTN: Had hyperkalemia at the last visit, recheck potassium was wnl, check a BMP today, continue losartan. URI: Overall getting slightly better, recommend to continue with Flonase, Mucinex, and a low-dose of hydrocodone to help with nocturnal cough.  Call if not better.   RTC 3 -4 months CPX

## 2018-09-11 ENCOUNTER — Other Ambulatory Visit: Payer: Self-pay | Admitting: Internal Medicine

## 2018-09-17 ENCOUNTER — Encounter: Payer: Self-pay | Admitting: Internal Medicine

## 2018-09-28 LAB — HM DIABETES EYE EXAM

## 2018-09-29 ENCOUNTER — Encounter: Payer: Self-pay | Admitting: Internal Medicine

## 2018-09-29 ENCOUNTER — Ambulatory Visit (INDEPENDENT_AMBULATORY_CARE_PROVIDER_SITE_OTHER): Payer: 59 | Admitting: Internal Medicine

## 2018-09-29 VITALS — BP 132/72 | HR 72 | Temp 98.4°F | Resp 16 | Ht 70.0 in | Wt 225.4 lb

## 2018-09-29 DIAGNOSIS — Z0000 Encounter for general adult medical examination without abnormal findings: Secondary | ICD-10-CM

## 2018-09-29 DIAGNOSIS — E119 Type 2 diabetes mellitus without complications: Secondary | ICD-10-CM

## 2018-09-29 DIAGNOSIS — Z23 Encounter for immunization: Secondary | ICD-10-CM | POA: Diagnosis not present

## 2018-09-29 LAB — CBC WITH DIFFERENTIAL/PLATELET
Basophils Absolute: 0 10*3/uL (ref 0.0–0.1)
Basophils Relative: 0.6 % (ref 0.0–3.0)
Eosinophils Absolute: 0.1 10*3/uL (ref 0.0–0.7)
Eosinophils Relative: 1.9 % (ref 0.0–5.0)
HCT: 42.6 % (ref 39.0–52.0)
HEMOGLOBIN: 14.5 g/dL (ref 13.0–17.0)
Lymphocytes Relative: 23.3 % (ref 12.0–46.0)
Lymphs Abs: 1.7 10*3/uL (ref 0.7–4.0)
MCHC: 33.9 g/dL (ref 30.0–36.0)
MCV: 89.6 fl (ref 78.0–100.0)
Monocytes Absolute: 0.4 10*3/uL (ref 0.1–1.0)
Monocytes Relative: 5.9 % (ref 3.0–12.0)
Neutro Abs: 4.9 10*3/uL (ref 1.4–7.7)
Neutrophils Relative %: 68.3 % (ref 43.0–77.0)
PLATELETS: 188 10*3/uL (ref 150.0–400.0)
RBC: 4.76 Mil/uL (ref 4.22–5.81)
RDW: 12.7 % (ref 11.5–15.5)
WBC: 7.2 10*3/uL (ref 4.0–10.5)

## 2018-09-29 LAB — LIPID PANEL
Cholesterol: 184 mg/dL (ref 0–200)
HDL: 38.5 mg/dL — ABNORMAL LOW (ref >39.00)
NonHDL: 145.06
Total CHOL/HDL Ratio: 5
Triglycerides: 272 mg/dL — ABNORMAL HIGH (ref 0.0–149.0)
VLDL: 54.4 mg/dL — AB (ref 0.0–40.0)

## 2018-09-29 LAB — AST: AST: 25 U/L (ref 0–37)

## 2018-09-29 LAB — HEMOGLOBIN A1C: Hgb A1c MFr Bld: 7.1 % — ABNORMAL HIGH (ref 4.6–6.5)

## 2018-09-29 LAB — ALT: ALT: 46 U/L (ref 0–53)

## 2018-09-29 LAB — LDL CHOLESTEROL, DIRECT: Direct LDL: 106 mg/dL

## 2018-09-29 MED ORDER — AZELASTINE HCL 0.1 % NA SOLN: 2.0000 | Freq: Two times a day (BID) | NASAL | 6 refills | Status: DC

## 2018-09-29 MED ORDER — AMOXICILLIN 500 MG PO CAPS: 1000.0000 mg | ORAL_CAPSULE | Freq: Two times a day (BID) | ORAL | 0 refills | Status: DC

## 2018-09-29 NOTE — Assessment & Plan Note (Addendum)
-  Td 2018; PNM 2014; Prevnar 2016;  Flu shot today - (+) FH CV Dz: plan is control CV RF; self d/c ASA d/t easy bruising, benefits of ASA discussed,ASA QOD? -labs: AST, ALT, FLP, CBC, A1c -Diet   Improving.  Recommend to increase physical activity.

## 2018-09-29 NOTE — Progress Notes (Signed)
Subjective:    Patient ID: Bradley Jefferson, male    DOB: 01/03/1971, 47 y.o.   MRN: [id number]  DOS:  09/29/2018 Type of visit - description: CPX Having upper respiratory symptoms for a while, currently on Flonase, DayQuil, NyQuil.  Symptoms are better but not completely well. No fever chills No cough or chest congestion  Also, occasionally has palpitations, no more than twice a month.  At rest or with exertion.  No associated symptoms specifically no chest pain, difficulty breathing, nausea, diaphoresis.   Review of Systems  Other than above, a 14 point review of systems is negative    Past Medical History:  Diagnosis Date  . Diabetes mellitus 03/2009  . GERD (gastroesophageal reflux disease)   . Low back pain    sees Chiro-herman    Past Surgical History:  Procedure Laterality Date  . abscess anal gland     s/p I&D    Social History   Socioeconomic History  . Marital status: Married    Spouse name: Not on file  . Number of children: 0  . Years of education: Not on file  . Highest education level: Not on file  Occupational History  . Occupation: manage a car parts department    Employer: [employer]  Social Needs  . Financial resource strain: Not on file  . Food insecurity:    Worry: Not on file    Inability: Not on file  . Transportation needs:    Medical: Not on file    Non-medical: Not on file  Tobacco Use  . Smoking status: Former Smoker    Last attempt to quit: 07/22/1999    Years since quitting: 19.2  . Smokeless tobacco: Never Used  Substance and Sexual Activity  . Alcohol use: Yes    Comment: socially   . Drug use: No  . Sexual activity: Not on file  Lifestyle  . Physical activity:    Days per week: Not on file    Minutes per session: Not on file  . Stress: Not on file  Relationships  . Social connections:    Talks on phone: Not on file    Gets together: Not on file    Attends religious service: Not on file    Active member of club  or organization: Not on file    Attends meetings of clubs or organizations: Not on file    Relationship status: Not on file  . Intimate partner violence:    Fear of current or ex partner: Not on file    Emotionally abused: Not on file    Physically abused: Not on file    Forced sexual activity: Not on file  Other Topics Concern  . Not on file  Social History Narrative   Lives w/ wife, she has h/o breast ca, also dx w/ H. Lymphoma ~ 03-2016, s/p chemo ,doing  ok as off 09-2018     Family History  Problem Relation Age of Onset  . Diabetes Father        Father brother (age 46)  . Hyperlipidemia Father   . CAD Father [id number]       first MI age 46  . Stroke Mother [id number]  . Colon cancer Neg Hx   . Colon polyps Neg Hx   . Prostate cancer Neg Hx      Allergies as of 09/29/2018   No Known Allergies     Medication List       Accurate as  of September 29, 2018 11:59 PM. Always use your most recent med list.        ALLEGRA PO Take 1 tablet by mouth daily. Reported on 01/05/2016   amoxicillin 500 MG capsule Commonly known as:  AMOXIL Take 2 capsules (1,000 mg total) by mouth 2 (two) times daily.   azelastine 0.1 % nasal spray Commonly known as:  ASTELIN Place 2 sprays into both nostrils 2 (two) times daily.   docusate sodium 100 MG capsule Commonly known as:  COLACE Take 100 mg by mouth daily as needed for mild constipation.   fluticasone 50 MCG/ACT nasal spray Commonly known as:  FLONASE Place 1 spray into both nostrils daily. OTC   glimepiride 4 MG tablet Commonly known as:  AMARYL TAKE 1 TABLET (4 MG TOTAL) BY MOUTH DAILY WITH BREAKFAST   HYDROcodone-homatropine 5-1.5 MG/5ML syrup Commonly known as:  HYCODAN Take 5 mLs by mouth at bedtime as needed for cough.   linagliptin 5 MG Tabs tablet Commonly known as:  TRADJENTA Take 1 tablet (5 mg total) by mouth daily.   losartan 100 MG tablet Commonly known as:  COZAAR Take 1 tablet (100 mg total) by mouth daily.     meloxicam 15 MG tablet Commonly known as:  MOBIC Take 15 mg by mouth daily.   metFORMIN 500 MG 24 hr tablet Commonly known as:  GLUCOPHAGE-XR Take 2 tablets (1,000 mg total) by mouth 2 (two) times daily with a meal.   pantoprazole 40 MG tablet Commonly known as:  PROTONIX Take 1 tablet (40 mg total) by mouth daily before breakfast.   triamcinolone cream 0.5 % Commonly known as:  KENALOG APPLY 1 APPLICATION TOPICALLY DAILY AS NEEDED.           Objective:   Physical Exam BP 132/72 (BP Location: Left Arm, Patient Position: Sitting, Cuff Size: Normal)   Pulse 72   Temp 98.4 F (36.9 C) (Oral)   Resp 16   Ht 5\' 10"  (1.778 m)   Wt 225 lb 6 oz (102.2 kg)   SpO2 98%   BMI 32.34 kg/m      General: Well developed, NAD, BMI noted Neck: No  thyromegaly  HEENT:  Normocephalic . Face symmetric, atraumatic Lungs:  CTA B Normal respiratory effort, no intercostal retractions, no accessory muscle use. Heart: RRR,  no murmur.  No pretibial edema bilaterally  Abdomen:  Not distended, soft, non-tender. No rebound or rigidity.   Skin: Exposed areas without rash. Not pale. Not jaundice DIABETIC FEET EXAM: No lower extremity edema Normal pedal pulses bilaterally Skin normal, nails normal, no calluses Pinprick examination of the feet normal. Neurologic:  alert & oriented X3.  Speech normal, gait appropriate for age and unassisted Strength symmetric and appropriate for age.  Psych: Cognition and judgment appear intact.  Cooperative with normal attention span and concentration.  Behavior appropriate. No anxious or depressed appearing.   Assessment       Assessment   DM 2010, no neuropathy HTN dx 2-17 GERD Occasional low back pain, sees a chiropractor. Occasional increase LFTs: Hepatitis B and C negative 12-2016 Ear eczema: Occasionally uses triamcinolone.  PLAN  DM: Currently on glimepiride, Tradjenta, metformin.  Feet exam negative today.  Last A1c 7.2 ~ 3 months ago.   Marcelline DeistFarxiga was offered but patient was reluctant. Has been trying to do better with lifestyle. I advised patient about the benefit of Farxiga over glimepiride. We will check A1c.  Further advised with results. HTN: On losartan, controlled. Palpitations: As  described above, symptoms are infrequent, EKG today NSR.  Holter monitor unlikely to be of help as symptoms are infrequent.  Recommend observation, if symptoms start to be more noticeable will call, will get a echo and possibly a cardiology consult. Aspirin: Self d/c due to easy bruising.  Benefit discussed. Allergies: Continue Mucinex, NyQuil, DayQuil, add Astelin. RTC 3 months

## 2018-09-29 NOTE — Progress Notes (Signed)
Pre visit review using our clinic review tool, if applicable. No additional management support is needed unless otherwise documented below in the visit note. 

## 2018-09-29 NOTE — Patient Instructions (Signed)
GO TO THE LAB : Get the blood work     GO TO THE FRONT DESK Schedule your next appointment for a   checkup in 3 months  Allergies: Continue Flonase, DayQuil, NyQuil Add Astelin If not completely well in few days, get amoxicillin, antibiotic.

## 2018-10-01 ENCOUNTER — Encounter: Payer: 59 | Admitting: Internal Medicine

## 2018-10-01 NOTE — Assessment & Plan Note (Signed)
  DM: Currently on glimepiride, Tradjenta, metformin.  Feet exam negative today.  Last A1c 7.2 ~ 3 months ago.  Marcelline DeistFarxiga was offered but patient was reluctant. Has been trying to do better with lifestyle. I advised patient about the benefit of Farxiga over glimepiride. We will check A1c.  Further advised with results. HTN: On losartan, controlled. Palpitations: As described above, symptoms are infrequent, EKG today NSR.  Holter monitor unlikely to be of help as symptoms are infrequent.  Recommend observation, if symptoms start to be more noticeable will call, will get a echo and possibly a cardiology consult. Aspirin: Self d/c due to easy bruising.  Benefit discussed. Allergies: Continue Mucinex, NyQuil, DayQuil, add Astelin. RTC 3 months

## 2018-10-14 ENCOUNTER — Encounter: Payer: Self-pay | Admitting: Internal Medicine

## 2018-10-16 ENCOUNTER — Other Ambulatory Visit: Payer: Self-pay | Admitting: Internal Medicine

## 2018-11-28 ENCOUNTER — Other Ambulatory Visit: Payer: Self-pay | Admitting: Internal Medicine

## 2018-12-11 ENCOUNTER — Other Ambulatory Visit: Payer: Self-pay | Admitting: Family Medicine

## 2018-12-15 ENCOUNTER — Telehealth: Payer: Self-pay

## 2018-12-15 MED ORDER — TELMISARTAN 80 MG PO TABS: 80.0000 mg | ORAL_TABLET | Freq: Every day | ORAL | 5 refills | Status: DC

## 2018-12-15 NOTE — Telephone Encounter (Signed)
Switch to telmisartan 80 mg 1 tablet daily. Advised patient to monitor BPs to be sure BP remains controlled

## 2018-12-15 NOTE — Telephone Encounter (Signed)
All doses of losartan currently on back order at [facility]. Please advise- Pt on losartan 100mg  daily.

## 2018-12-15 NOTE — Telephone Encounter (Signed)
Telmisartan 80mg  sent to [facility]. Pt informed of med change via MyChart.

## 2018-12-24 ENCOUNTER — Other Ambulatory Visit: Payer: Self-pay | Admitting: Internal Medicine

## 2019-01-04 NOTE — Telephone Encounter (Signed)
Spoke w/ Pt- virtual visit on 01/08/2019 at 1pm

## 2019-01-08 ENCOUNTER — Ambulatory Visit (INDEPENDENT_AMBULATORY_CARE_PROVIDER_SITE_OTHER): Payer: 59 | Admitting: Internal Medicine

## 2019-01-08 ENCOUNTER — Other Ambulatory Visit: Payer: Self-pay

## 2019-01-08 DIAGNOSIS — I10 Essential (primary) hypertension: Secondary | ICD-10-CM | POA: Diagnosis not present

## 2019-01-08 DIAGNOSIS — E119 Type 2 diabetes mellitus without complications: Secondary | ICD-10-CM | POA: Diagnosis not present

## 2019-01-08 NOTE — Progress Notes (Signed)
Subjective:    Patient ID: Bradley Jefferson, male    DOB: [birth year], 48 y.o.   MRN: [id number]  DOS:  01/08/2019 Type of visit - description: Virtual Visit via Video Note  I connected with@ on 01/08/19 at  1:00 PM EDT by a video enabled telemedicine application and verified that I am speaking with the correct person using two identifiers.   THIS ENCOUNTER IS A VIRTUAL VISIT DUE TO COVID-19 - PATIENT WAS NOT SEEN IN THE OFFICE. PATIENT HAS CONSENTED TO VIRTUAL VISIT / TELEMEDICINE VISIT   Location of patient: home  Location of provider: office  I discussed the limitations of evaluation and management by telemedicine and the availability of in person appointments. The patient expressed understanding and agreed to proceed.  History of Present Illness: Routine office visit DM: Good compliance with medications, has definitely improved his diet, has lost some weight.  CBGs are occasionally low HTN: Good compliance with medication, check his BP from time to time. We reviewed together his labs and medication list.   Review of Systems Denies fever chills No chest pain or difficulty breathing. No cough. He is following all the hygiene recommendations from the CDC.  Still going to work but keeping good social distancing.   Past Medical History:  Diagnosis Date  . Diabetes mellitus 03/2009  . GERD (gastroesophageal reflux disease)   . Low back pain    sees Chiro-[facility]man    Past Surgical History:  Procedure Laterality Date  . abscess anal gland     s/p I&D    Social History   Socioeconomic History  . Marital status: Married    Spouse name: Not on file  . Number of children: 0  . Years of education: Not on file  . Highest education level: Not on file  Occupational History  . Occupation: manage a car parts department    Employer: [employer]  Social Needs  . Financial resource strain: Not on file  . Food insecurity:    Worry: Not on file    Inability: Not on file  .  Transportation needs:    Medical: Not on file    Non-medical: Not on file  Tobacco Use  . Smoking status: Former Smoker    Last attempt to quit: 07/22/1999    Years since quitting: 19.4  . Smokeless tobacco: Never Used  Substance and Sexual Activity  . Alcohol use: Yes    Comment: socially   . Drug use: No  . Sexual activity: Not on file  Lifestyle  . Physical activity:    Days per week: Not on file    Minutes per session: Not on file  . Stress: Not on file  Relationships  . Social connections:    Talks on phone: Not on file    Gets together: Not on file    Attends religious service: Not on file    Active member of club or organization: Not on file    Attends meetings of clubs or organizations: Not on file    Relationship status: Not on file  . Intimate partner violence:    Fear of current or ex partner: Not on file    Emotionally abused: Not on file    Physically abused: Not on file    Forced sexual activity: Not on file  Other Topics Concern  . Not on file  Social History Narrative   Lives w/ wife, she has h/o breast ca, also dx w/ H. Lymphoma ~ 03-2016, s/p  chemo ,doing  ok as off 09-2018      Allergies as of 01/08/2019   No Known Allergies     Medication List       Accurate as of January 08, 2019 12:58 PM. Always use your most recent med list.        ALLEGRA PO Take 1 tablet by mouth daily. Reported on 01/05/2016   amoxicillin 500 MG capsule Commonly known as:  AMOXIL Take 2 capsules (1,000 mg total) by mouth 2 (two) times daily.   azelastine 0.1 % nasal spray Commonly known as:  ASTELIN Place 2 sprays into both nostrils 2 (two) times daily.   docusate sodium 100 MG capsule Commonly known as:  COLACE Take 100 mg by mouth daily as needed for mild constipation.   fluticasone 50 MCG/ACT nasal spray Commonly known as:  FLONASE Place 1 spray into both nostrils daily. OTC   glimepiride 4 MG tablet Commonly known as:  AMARYL TAKE 1 TABLET BY MOUTH DAILY  WITH BREAKFAST   HYDROcodone-homatropine 5-1.5 MG/5ML syrup Commonly known as:  HYCODAN Take 5 mLs by mouth at bedtime as needed for cough.   linagliptin 5 MG Tabs tablet Commonly known as:  Tradjenta Take 1 tablet (5 mg total) by mouth daily.   meloxicam 15 MG tablet Commonly known as:  MOBIC Take 15 mg by mouth daily.   metFORMIN 500 MG 24 hr tablet Commonly known as:  GLUCOPHAGE-XR Take 2 tablets (1,000 mg total) by mouth 2 (two) times daily with a meal.   pantoprazole 40 MG tablet Commonly known as:  PROTONIX Take 1 tablet (40 mg total) by mouth daily before breakfast.   telmisartan 80 MG tablet Commonly known as:  MICARDIS Take 1 tablet (80 mg total) by mouth daily.   triamcinolone cream 0.5 % Commonly known as:  KENALOG APPLY 1 APPLICATION TOPICALLY DAILY AS NEEDED.           Objective:   Physical Exam There were no vitals taken for this visit. This is a video visit, he is alert and oriented x3 and he looks in no distress    Assessment     Assessment   DM 2010, no neuropathy HTN dx 2-17 GERD Occasional low back pain, sees a chiropractor. Occasional increase LFTs: Hepatitis B and C negative 12-2016 Ear eczema: Occasionally uses triamcinolone.  PLAN DM: Last A1c was 7.1, we agreed to continue the same medications including glimepiride, metformin, Tradjenta.  He is doing great with diet and exercise, has lost approximately 13 pounds in 3 months.  CBGs range from 60-90.  Occasionally feels unwell when the sugar is low. Plan: Decrease glimepiride to half tablet daily, continue monitoring CBGs, goal is to get CBGs above 90.  Continue with his excellent lifestyle. HTN: Continue Micardis Meloxicam: Takes it as needed Under normal circumstances I will check a BMP and A1c but due to the coronavirus issue I think is safer to not bring the patient to the office Palpitations: See last visit, still an issue, infrequently. Coronavirus pandemia: Fortunately he is  asymptomatic and is following all the CDC recommendations regards hygiene Next visit: 3 months either face-to-face or virtual.   I discussed the assessment and treatment plan with the patient. The patient was provided an opportunity to ask questions and all were answered. The patient agreed with the plan and demonstrated an understanding of the instructions.   The patient was advised to call back or seek an in-person evaluation if the symptoms worsen or if  the condition fails to improve as anticipated.

## 2019-01-11 NOTE — Assessment & Plan Note (Signed)
DM: Last A1c was 7.1, we agreed to continue the same medications including glimepiride, metformin, Tradjenta.  He is doing great with diet and exercise, has lost approximately 13 pounds in 3 months.  CBGs range from 60-90.  Occasionally feels unwell when the sugar is low. Plan: Decrease glimepiride to half tablet daily, continue monitoring CBGs, goal is to get CBGs above 90.  Continue with his excellent lifestyle. HTN: Continue Micardis Meloxicam: Takes it as needed Under normal circumstances I will check a BMP and A1c but due to the coronavirus issue I think is safer to not bring the patient to the office Palpitations: See last visit, still an issue, infrequently. Coronavirus pandemia: Fortunately he is asymptomatic and is following all the CDC recommendations regards hygiene Next visit: 3 months either face-to-face or virtual.

## 2019-02-23 ENCOUNTER — Other Ambulatory Visit: Payer: Self-pay | Admitting: Internal Medicine

## 2019-04-04 ENCOUNTER — Other Ambulatory Visit: Payer: Self-pay | Admitting: Internal Medicine

## 2019-05-17 ENCOUNTER — Other Ambulatory Visit: Payer: Self-pay

## 2019-05-17 ENCOUNTER — Ambulatory Visit (INDEPENDENT_AMBULATORY_CARE_PROVIDER_SITE_OTHER): Payer: 59 | Admitting: Internal Medicine

## 2019-05-17 ENCOUNTER — Encounter: Payer: Self-pay | Admitting: Internal Medicine

## 2019-05-17 DIAGNOSIS — R509 Fever, unspecified: Secondary | ICD-10-CM

## 2019-05-17 DIAGNOSIS — Z20822 Contact with and (suspected) exposure to covid-19: Secondary | ICD-10-CM

## 2019-05-17 NOTE — Progress Notes (Signed)
Subjective:    Patient ID: Bradley Jefferson, male    DOB: 05/18/1971, 48 y.o.   MRN: [id number]  DOS:  05/17/2019 Type of visit - description: Virtual Visit via Video Note  I connected with@   by a video enabled telemedicine application and verified that I am speaking with the correct person using two identifiers.   THIS ENCOUNTER IS A VIRTUAL VISIT DUE TO COVID-19 - PATIENT WAS NOT SEEN IN THE OFFICE. PATIENT HAS CONSENTED TO VIRTUAL VISIT / TELEMEDICINE VISIT   Location of patient: home  Location of provider: office  I discussed the limitations of evaluation and management by telemedicine and the availability of in person appointments. The patient expressed understanding and agreed to proceed.  History of Present Illness: Acute Patient developed fever on 05/12/2019, last approximately 12 hours, T-max was 100.2. Then he become asymptomatic. He had fever again for 12 hours on 05/15/2019.  Currently is completely asymptomatic but resting at home, likes to be sure he does not have corona virus. He lives with his wife, she is fine He is taking good precautions, no known COVID-19 contacts   Review of Systems Denies sore throat, chest pain or difficulty breathing No nausea, vomiting, diarrhea No cough Mild myalgias only the times he had fever  Past Medical History:  Diagnosis Date  . Diabetes mellitus 03/2009  . GERD (gastroesophageal reflux disease)   . Low back pain    sees Chiro-[facility]    Past Surgical History:  Procedure Laterality Date  . abscess anal gland     s/p I&D    Social History   Socioeconomic History  . Marital status: Married    Spouse name: Not on file  . Number of children: 0  . Years of education: Not on file  . Highest education level: Not on file  Occupational History  . Occupation: manage a car parts department    Employer: [employer]  Social Needs  . Financial resource strain: Not on file  . Food insecurity    Worry: Not on file   Inability: Not on file  . Transportation needs    Medical: Not on file    Non-medical: Not on file  Tobacco Use  . Smoking status: Former Smoker    Quit date: 07/22/1999    Years since quitting: 19.8  . Smokeless tobacco: Never Used  Substance and Sexual Activity  . Alcohol use: Yes    Comment: socially   . Drug use: No  . Sexual activity: Not on file  Lifestyle  . Physical activity    Days per week: Not on file    Minutes per session: Not on file  . Stress: Not on file  Relationships  . Social Musicianconnections    Talks on phone: Not on file    Gets together: Not on file    Attends religious service: Not on file    Active member of club or organization: Not on file    Attends meetings of clubs or organizations: Not on file    Relationship status: Not on file  . Intimate partner violence    Fear of current or ex partner: Not on file    Emotionally abused: Not on file    Physically abused: Not on file    Forced sexual activity: Not on file  Other Topics Concern  . Not on file  Social History Narrative   Lives w/ wife, she has h/o breast ca, also dx w/ H. Lymphoma ~ 03-2016,  s/p chemo ,doing  ok as off 09-2018      Allergies as of 05/17/2019   No Known Allergies     Medication List       Accurate as of May 17, 2019 11:59 PM. If you have any questions, ask your nurse or doctor.        ALLEGRA PO Take 1 tablet by mouth daily. Reported on 01/05/2016   azelastine 0.1 % nasal spray Commonly known as: ASTELIN Place 2 sprays into both nostrils 2 (two) times daily.   docusate sodium 100 MG capsule Commonly known as: COLACE Take 100 mg by mouth daily as needed for mild constipation.   fluticasone 50 MCG/ACT nasal spray Commonly known as: FLONASE Place 1 spray into both nostrils daily. OTC   glimepiride 4 MG tablet Commonly known as: AMARYL Take 0.5 tablets (2 mg total) by mouth daily with breakfast.   linagliptin 5 MG Tabs tablet Commonly known as: Tradjenta Take  1 tablet (5 mg total) by mouth daily.   meloxicam 15 MG tablet Commonly known as: MOBIC Take 15 mg by mouth daily.   metFORMIN 500 MG 24 hr tablet Commonly known as: GLUCOPHAGE-XR Take 2 tablets (1,000 mg total) by mouth 2 (two) times daily with a meal.   pantoprazole 40 MG tablet Commonly known as: PROTONIX Take 1 tablet (40 mg total) by mouth daily before breakfast.   telmisartan 80 MG tablet Commonly known as: MICARDIS Take 1 tablet (80 mg total) by mouth daily.   triamcinolone cream 0.5 % Commonly known as: KENALOG APPLY 1 APPLICATION TOPICALLY DAILY AS NEEDED.           Objective:   Physical Exam There were no vitals taken for this visit. This is a virtual video visit, he is alert oriented x3, no apparent distress.    Assessment     Assessment   DM 2010, no neuropathy HTN dx 2-17 GERD Occasional low back pain, sees a chiropractor. Occasional increase LFTs: Hepatitis B and C negative 12-2016 Ear eczema: Occasionally uses triamcinolone.  PLAN Febrile illness: As described above, will get a COVID-19 testing in the morning, if negative he can go back to work, if +, then 10 days have to pass since the last fever. Asked patient to keep me posted regarding symptoms. Continue with all the precautions including at home with his wife RTC 3 weeks for a routine checkup, will call and schedule   I discussed the assessment and treatment plan with the patient. The patient was provided an opportunity to ask questions and all were answered. The patient agreed with the plan and demonstrated an understanding of the instructions.   The patient was advised to call back or seek an in-person evaluation if the symptoms worsen or if the condition fails to improve as anticipated.

## 2019-05-18 ENCOUNTER — Other Ambulatory Visit: Payer: Self-pay

## 2019-05-18 DIAGNOSIS — Z20822 Contact with and (suspected) exposure to covid-19: Secondary | ICD-10-CM

## 2019-05-18 NOTE — Assessment & Plan Note (Signed)
Febrile illness: As described above, will get a COVID-19 testing in the morning, if negative he can go back to work, if +, then 10 days have to pass since the last fever. Asked patient to keep me posted regarding symptoms. Continue with all the precautions including at home with his wife RTC 3 weeks for a routine checkup, will call and schedule

## 2019-05-19 LAB — NOVEL CORONAVIRUS, NAA: SARS-CoV-2, NAA: NOT DETECTED

## 2019-05-22 ENCOUNTER — Other Ambulatory Visit: Payer: Self-pay | Admitting: Family Medicine

## 2019-05-23 ENCOUNTER — Other Ambulatory Visit: Payer: Self-pay | Admitting: Internal Medicine

## 2019-05-24 MED ORDER — TELMISARTAN 80 MG PO TABS: 80.0000 mg | ORAL_TABLET | Freq: Every day | ORAL | 5 refills | Status: DC

## 2019-06-08 ENCOUNTER — Encounter: Payer: Self-pay | Admitting: Internal Medicine

## 2019-06-08 ENCOUNTER — Ambulatory Visit: Payer: 59 | Admitting: Internal Medicine

## 2019-06-08 ENCOUNTER — Other Ambulatory Visit: Payer: Self-pay

## 2019-06-08 VITALS — BP 145/88 | HR 65 | Temp 97.0°F | Resp 16 | Ht 70.0 in | Wt 204.1 lb

## 2019-06-08 DIAGNOSIS — E119 Type 2 diabetes mellitus without complications: Secondary | ICD-10-CM | POA: Diagnosis not present

## 2019-06-08 DIAGNOSIS — I10 Essential (primary) hypertension: Secondary | ICD-10-CM | POA: Diagnosis not present

## 2019-06-08 DIAGNOSIS — Z23 Encounter for immunization: Secondary | ICD-10-CM

## 2019-06-08 LAB — COMPREHENSIVE METABOLIC PANEL
ALT: 23 U/L (ref 0–53)
AST: 17 U/L (ref 0–37)
Albumin: 4.6 g/dL (ref 3.5–5.2)
Alkaline Phosphatase: 42 U/L (ref 39–117)
BUN: 19 mg/dL (ref 6–23)
CO2: 27 mEq/L (ref 19–32)
Calcium: 9.7 mg/dL (ref 8.4–10.5)
Chloride: 102 mEq/L (ref 96–112)
Creatinine, Ser: 0.99 mg/dL (ref 0.40–1.50)
GFR: 81 mL/min (ref >60.00)
Glucose, Bld: 121 mg/dL — ABNORMAL HIGH (ref 70–99)
Potassium: 4.4 mEq/L (ref 3.5–5.1)
Sodium: 139 mEq/L (ref 135–145)
Total Bilirubin: 0.6 mg/dL (ref 0.2–1.2)
Total Protein: 6.9 g/dL (ref 6.0–8.3)

## 2019-06-08 LAB — HEMOGLOBIN A1C: Hgb A1c MFr Bld: 6.1 % (ref 4.6–6.5)

## 2019-06-08 MED ORDER — KETOCONAZOLE 2 % EX CREA: 1.0000 "application " | TOPICAL_CREAM | Freq: Two times a day (BID) | CUTANEOUS | 1 refills | Status: DC | PRN

## 2019-06-08 NOTE — Progress Notes (Signed)
Subjective:    Patient ID: Bradley Jefferson, male    DOB: 08/11/1971, 48 y.o.   MRN: [id number]  DOS:  06/08/2019 Type of visit - description: Routine visit DM: We reviewed his ambulatory CBGs.  Occasionally in the low side HTN: Ambulatory BPs are normal, he takes either Micardis or losartan   Wt Readings from Last 3 Encounters:  06/08/19 204 lb 2 oz (92.6 kg)  09/29/18 225 lb 6 oz (102.2 kg)  07/03/18 224 lb 8 oz (101.8 kg)     Review of Systems Reports fungal dermatitis at the groin on and off, he works on a very hot environment.  Using Lotrimin OTC but the rash keeps coming back according to the patient.   Past Medical History:  Diagnosis Date  . Diabetes mellitus 03/2009  . GERD (gastroesophageal reflux disease)   . Low back pain    sees Chiro-[facility]    Past Surgical History:  Procedure Laterality Date  . abscess anal gland     s/p I&D    Social History   Socioeconomic History  . Marital status: Married    Spouse name: Not on file  . Number of children: 0  . Years of education: Not on file  . Highest education level: Not on file  Occupational History  . Occupation: manage a car parts department    Employer: [employer]  Social Needs  . Financial resource strain: Not on file  . Food insecurity    Worry: Not on file    Inability: Not on file  . Transportation needs    Medical: Not on file    Non-medical: Not on file  Tobacco Use  . Smoking status: Former Smoker    Quit date: 07/22/1999    Years since quitting: 19.8  . Smokeless tobacco: Never Used  Substance and Sexual Activity  . Alcohol use: Yes    Comment: socially   . Drug use: No  . Sexual activity: Not on file  Lifestyle  . Physical activity    Days per week: Not on file    Minutes per session: Not on file  . Stress: Not on file  Relationships  . Social Musicianconnections    Talks on phone: Not on file    Gets together: Not on file    Attends religious service: Not on file    Active member  of club or organization: Not on file    Attends meetings of clubs or organizations: Not on file    Relationship status: Not on file  . Intimate partner violence    Fear of current or ex partner: Not on file    Emotionally abused: Not on file    Physically abused: Not on file    Forced sexual activity: Not on file  Other Topics Concern  . Not on file  Social History Narrative   Lives w/ wife, she has h/o breast ca, also dx w/ H. Lymphoma ~ 03-2016, s/p chemo ,doing  ok as off 09-2018      Allergies as of 06/08/2019   No Known Allergies     Medication List       Accurate as of June 08, 2019  9:12 AM. If you have any questions, ask your nurse or doctor.        ALLEGRA PO Take 1 tablet by mouth daily. Reported on 01/05/2016   azelastine 0.1 % nasal spray Commonly known as: ASTELIN Place 2 sprays into both nostrils 2 (two) times daily.  docusate sodium 100 MG capsule Commonly known as: COLACE Take 100 mg by mouth daily as needed for mild constipation.   FIBER-CAPS PO   fluticasone 50 MCG/ACT nasal spray Commonly known as: FLONASE Place 1 spray into both nostrils daily. OTC   glimepiride 4 MG tablet Commonly known as: AMARYL Take 0.5 tablets (2 mg total) by mouth daily with breakfast.   KLS Aller-Tec 10 MG tablet Generic drug: cetirizine Take 10 mg by mouth daily.   linagliptin 5 MG Tabs tablet Commonly known as: Tradjenta Take 1 tablet (5 mg total) by mouth daily.   meloxicam 15 MG tablet Commonly known as: MOBIC Take 15 mg by mouth daily.   metFORMIN 500 MG 24 hr tablet Commonly known as: GLUCOPHAGE-XR Take 2 tablets (1,000 mg total) by mouth 2 (two) times daily with a meal.   pantoprazole 40 MG tablet Commonly known as: PROTONIX Take 1 tablet (40 mg total) by mouth daily before breakfast.   telmisartan 80 MG tablet Commonly known as: MICARDIS Take 1 tablet (80 mg total) by mouth daily.   triamcinolone cream 0.5 % Commonly known as: KENALOG APPLY  1 APPLICATION TOPICALLY DAILY AS NEEDED.           Objective:   Physical Exam BP (!) 145/88 (BP Location: Left Arm, Patient Position: Sitting, Cuff Size: Normal)   Pulse 65   Temp (!) 97 F (36.1 C) (Temporal)   Resp 16   Ht 5\' 10"  (1.778 m)   Wt 204 lb 2 oz (92.6 kg)   SpO2 99%   BMI 29.29 kg/m  General:   Well developed, NAD, BMI noted. HEENT:  Normocephalic . Face symmetric, atraumatic Lungs:  CTA B Normal respiratory effort, no intercostal retractions, no accessory muscle use. Heart: RRR,  no murmur.  No pretibial edema bilaterally  Skin: Not pale. Not jaundice Neurologic:  alert & oriented X3.  Speech normal, gait appropriate for age and unassisted Psych--  Cognition and judgment appear intact.  Cooperative with normal attention span and concentration.  Behavior appropriate. No anxious or depressed appearing.      Assessment       Assessment   DM 2010, no neuropathy HTN dx 2-17 GERD Occasional low back pain, sees a chiropractor. Occasional increase LFTs: Hepatitis B and C negative 12-2016 Ear eczema: Occasionally uses triamcinolone.  PLAN DM: Currently on metformin ER 500 mg: 2 tablets B.I.D., Tradjenta, glimepiride 2 mg daily.  Sometimes few hours after glimepiride his blood sugar get down to the 70s. He continues to do well with diet and has lost some more weight Plan: Check A1c Decrease glimepiride 4 mg to 1/4 tablet twice a day as needed.  Skip it if she plans to skip or had a very light meal. If metformin ER 500 mg becomes available, will try metformin ER 750 mg 3 times daily. HTN: BP today slightly elevated, at home is consistently normal, continue with either Micardis or losartan (what ever is available at the pharmacy ). Back pain: Hardly ever uses meloxicam Fungal dermatitis: On and off on the groin, Lotrimin not working consistently, trial with Nizoral.  Continue using powder over talc to prevent maceration Preventive care: Flu shot today RTC  3 months

## 2019-06-08 NOTE — Patient Instructions (Signed)
GO TO THE LAB : Get the blood work     GO TO THE FRONT DESK Schedule your next appointment   for a physical exam by December 2020   

## 2019-06-08 NOTE — Assessment & Plan Note (Signed)
  DM: Currently on metformin ER 500 mg: 2 tablets B.I.D., Tradjenta, glimepiride 2 mg daily.  Sometimes few hours after glimepiride his blood sugar get down to the 70s. He continues to do well with diet and has lost some more weight Plan: Check A1c Decrease glimepiride 4 mg to 1/4 tablet twice a day as needed.  Skip it if she plans to skip or had a very light meal. If metformin ER 500 mg becomes available, will try metformin ER 750 mg 3 times daily. HTN: BP today slightly elevated, at home is consistently normal, continue with either Micardis or losartan (what ever is available at the pharmacy ). Back pain: Hardly ever uses meloxicam Fungal dermatitis: On and off on the groin, Lotrimin not working consistently, trial with Nizoral.  Continue using powder over talc to prevent maceration Preventive care: Flu shot today RTC 3 months

## 2019-06-08 NOTE — Progress Notes (Signed)
Pre visit review using our clinic review tool, if applicable. No additional management support is needed unless otherwise documented below in the visit note. 

## 2019-06-20 ENCOUNTER — Other Ambulatory Visit: Payer: Self-pay | Admitting: Internal Medicine

## 2019-07-18 ENCOUNTER — Encounter: Payer: Self-pay | Admitting: Internal Medicine

## 2019-07-19 ENCOUNTER — Other Ambulatory Visit: Payer: Self-pay | Admitting: Internal Medicine

## 2019-07-19 MED ORDER — SITAGLIPTIN PHOSPHATE 100 MG PO TABS: 100.0000 mg | ORAL_TABLET | Freq: Every day | ORAL | 1 refills | Status: DC

## 2019-08-23 ENCOUNTER — Other Ambulatory Visit: Payer: Self-pay | Admitting: Internal Medicine

## 2019-10-28 ENCOUNTER — Other Ambulatory Visit: Payer: Self-pay | Admitting: Internal Medicine

## 2019-11-20 ENCOUNTER — Other Ambulatory Visit: Payer: Self-pay | Admitting: Internal Medicine

## 2019-11-22 ENCOUNTER — Other Ambulatory Visit: Payer: Self-pay | Admitting: Internal Medicine

## 2019-11-26 ENCOUNTER — Other Ambulatory Visit: Payer: Self-pay | Admitting: Internal Medicine

## 2019-11-26 NOTE — Telephone Encounter (Signed)
Discussed w/ Dr. Drue Novel- Pt taking glimepiride 4mg : 1/4 bid prn

## 2019-11-30 ENCOUNTER — Other Ambulatory Visit: Payer: Self-pay | Admitting: Internal Medicine

## 2019-12-03 ENCOUNTER — Ambulatory Visit (INDEPENDENT_AMBULATORY_CARE_PROVIDER_SITE_OTHER): Payer: 59 | Admitting: Internal Medicine

## 2019-12-03 ENCOUNTER — Encounter: Payer: Self-pay | Admitting: Internal Medicine

## 2019-12-03 ENCOUNTER — Other Ambulatory Visit: Payer: Self-pay

## 2019-12-03 VITALS — BP 160/94 | HR 77 | Temp 97.0°F | Resp 16 | Ht 70.0 in | Wt 216.4 lb

## 2019-12-03 DIAGNOSIS — E119 Type 2 diabetes mellitus without complications: Secondary | ICD-10-CM

## 2019-12-03 DIAGNOSIS — Z0000 Encounter for general adult medical examination without abnormal findings: Secondary | ICD-10-CM

## 2019-12-03 DIAGNOSIS — R002 Palpitations: Secondary | ICD-10-CM

## 2019-12-03 DIAGNOSIS — I10 Essential (primary) hypertension: Secondary | ICD-10-CM | POA: Diagnosis not present

## 2019-12-03 DIAGNOSIS — Z8371 Family history of colonic polyps: Secondary | ICD-10-CM

## 2019-12-03 DIAGNOSIS — Z1211 Encounter for screening for malignant neoplasm of colon: Secondary | ICD-10-CM

## 2019-12-03 NOTE — Patient Instructions (Addendum)
Per our records you are due for an eye exam. Please contact your eye doctor to schedule an appointment. Please have them send copies of your office visit notes to [place]. Our fax number is [phone].     GO TO THE FRONT DESK Come back for a checkup in 4 months, please make an appointment  Continue checking your blood pressure at home BP GOAL is between 110/65 and  135/85. If it is consistently higher or lower, let me know

## 2019-12-03 NOTE — Progress Notes (Signed)
Pre visit review using our clinic review tool, if applicable. No additional management support is needed unless otherwise documented below in the visit note. 

## 2019-12-03 NOTE — Progress Notes (Signed)
Subjective:    Patient ID: Bradley Jefferson, male    DOB: 1970-11-13, 49 y.o.   MRN: [id number]  DOS:  12/03/2019 Type of visit - description: cpx Here for CPX He had Covid approximately December 2020, he has recuperated to 100%. Ongoing palpitations, on and off, may last seconds 2 to 3 hours No associated symptoms.    Review of Systems  Other than above, a 14 point review of systems is negative    Past Medical History:  Diagnosis Date  . Diabetes mellitus 03/2009  . GERD (gastroesophageal reflux disease)   . Low back pain    sees Chiro-herman    Past Surgical History:  Procedure Laterality Date  . abscess anal gland     s/p I&D   Family History  Problem Relation Age of Onset  . Diabetes Father        Father brother (age 85)  . Hyperlipidemia Father   . CAD Father 56       first MI age 69  . Stroke Mother 35  . Colon cancer Neg Hx   . Colon polyps Neg Hx   . Prostate cancer Neg Hx     Allergies as of 12/03/2019   No Known Allergies     Medication List       Accurate as of December 03, 2019 11:59 PM. If you have any questions, ask your nurse or doctor.        STOP taking these medications   ketoconazole 2 % cream Commonly known as: NIZORAL Stopped by: Kathlene November, MD   sitaGLIPtin 100 MG tablet Commonly known as: Januvia Stopped by: Kathlene November, MD   triamcinolone cream 0.5 % Commonly known as: KENALOG Stopped by: Kathlene November, MD     TAKE these medications   ALLEGRA PO Take 1 tablet by mouth daily. Reported on 01/05/2016   azelastine 0.1 % nasal spray Commonly known as: ASTELIN Place 2 sprays into both nostrils 2 (two) times daily.   docusate sodium 100 MG capsule Commonly known as: COLACE Take 100 mg by mouth daily as needed for mild constipation.   FIBER-CAPS PO   fluticasone 50 MCG/ACT nasal spray Commonly known as: FLONASE Place 1 spray into both nostrils daily. OTC   glimepiride 4 MG tablet Commonly known as: AMARYL Take 1/4 tablet by  mouth twice daily as needed   KLS Aller-Tec 10 MG tablet Generic drug: cetirizine Take 10 mg by mouth daily.   linagliptin 5 MG Tabs tablet Commonly known as: TRADJENTA Take 1 tablet by mouth daily.   meloxicam 15 MG tablet Commonly known as: MOBIC Take 15 mg by mouth daily.   metFORMIN 500 MG 24 hr tablet Commonly known as: GLUCOPHAGE-XR Take 2 tablets (1,000 mg total) by mouth 2 (two) times daily with a meal.   pantoprazole 40 MG tablet Commonly known as: PROTONIX Take 1 tablet (40 mg total) by mouth daily before breakfast.   telmisartan 80 MG tablet Commonly known as: MICARDIS TAKE 1 TABLET BY MOUTH EVERY DAY             Objective:   Physical Exam BP (!) 160/94 (BP Location: Left Arm, Patient Position: Sitting, Cuff Size: Normal)   Pulse 77   Temp (!) 97 F (36.1 C) (Temporal)   Resp 16   Ht 5\' 10"  (1.778 m)   Wt 216 lb 6 oz (98.1 kg)   SpO2 100%   BMI 31.05 kg/m  General: Well developed, NAD, BMI noted Neck:  No  thyromegaly  HEENT:  Normocephalic . Face symmetric, atraumatic Lungs:  CTA B Normal respiratory effort, no intercostal retractions, no accessory muscle use. Heart: RRR,  no murmur.  Abdomen:  Not distended, soft, non-tender. No rebound or rigidity.   DM foot exam: No edema, good pedal pulses, pinprick examination normal Skin: Exposed areas without rash. Not pale. Not jaundice Neurologic:  alert & oriented X3.  Speech normal, gait appropriate for age and unassisted Strength symmetric and appropriate for age.  Psych: Cognition and judgment appear intact.  Cooperative with normal attention span and concentration.  Behavior appropriate. No anxious or depressed appearing.      Assessment     Assessment   DM 2010, no neuropathy HTN dx 2-17 GERD Occasional low back pain, sees a chiropractor. Occasional increase LFTs: Hepatitis B and C negative 12-2016 Ear eczema: Occasionally uses triamcinolone.  PLAN Here for CPX DM: Currently on  glimepiride either once or twice a day.  Januvia was switch to Tradjenta, also on Metformin, no ambulatory CBGs.  Feet exam negative today. HTN: On Micardis, BP today elevated but is consistently normal at home.  No change. GERD: On PPIs.  Controlled. Palpitations: Ongoing problems, would like further investigation. EKG today: NSR, no change from previous Refer to cardiology RTC 4 months     This visit occurred during the SARS-CoV-2 public health emergency.  Safety protocols were in place, including screening questions prior to the visit, additional usage of staff PPE, and extensive cleaning of exam room while observing appropriate contact time as indicated for disinfecting solutions.

## 2019-12-03 NOTE — Assessment & Plan Note (Signed)
-  Td 2018; PNM 2014; Prevnar 2016;  had a Flu shot ; plans to get the covid shot when available  - (+) FH CV Dz: plan is control CV RF; self d/c ASA d/t easy bruising, benefits of ASA discussed before  - CCS: mother had multiple polyps at age 48, 3 options d/w pt, refer to GI [facility]  - labs:  BMP, CBC, A1c, FLP, TSH -Diet - exercise discussed, he is doing better with diet.

## 2019-12-04 LAB — BASIC METABOLIC PANEL
BUN/Creatinine Ratio: 24 (calc) — ABNORMAL HIGH (ref 6–22)
BUN: 34 mg/dL — ABNORMAL HIGH (ref 7–25)
CO2: 19 mmol/L — ABNORMAL LOW (ref 20–32)
Calcium: 10.2 mg/dL (ref 8.6–10.3)
Chloride: 100 mmol/L (ref 98–110)
Creat: 1.4 mg/dL — ABNORMAL HIGH (ref 0.60–1.35)
Glucose, Bld: 124 mg/dL — ABNORMAL HIGH (ref 65–99)
Potassium: 4.6 mmol/L (ref 3.5–5.3)
Sodium: 138 mmol/L (ref 135–146)

## 2019-12-04 LAB — CBC WITH DIFFERENTIAL/PLATELET
Absolute Monocytes: 667 cells/uL (ref 200–950)
Basophils Absolute: 46 cells/uL (ref 0–200)
Basophils Relative: 0.4 %
Eosinophils Absolute: 92 cells/uL (ref 15–500)
Eosinophils Relative: 0.8 %
HCT: 45.9 % (ref 38.5–50.0)
Hemoglobin: 15.6 g/dL (ref 13.2–17.1)
Lymphs Abs: 1978 cells/uL (ref 850–3900)
MCH: 30.3 pg (ref 27.0–33.0)
MCHC: 34 g/dL (ref 32.0–36.0)
MCV: 89.1 fL (ref 80.0–100.0)
MPV: 10.6 fL (ref 7.5–12.5)
Monocytes Relative: 5.8 %
Neutro Abs: 8717 cells/uL — ABNORMAL HIGH (ref 1500–7800)
Neutrophils Relative %: 75.8 %
Platelets: 219 10*3/uL (ref 140–400)
RBC: 5.15 10*6/uL (ref 4.20–5.80)
RDW: 12.7 % (ref 11.0–15.0)
Total Lymphocyte: 17.2 %
WBC: 11.5 10*3/uL — ABNORMAL HIGH (ref 3.8–10.8)

## 2019-12-04 LAB — HEMOGLOBIN A1C
Hgb A1c MFr Bld: 6.9 % of total Hgb — ABNORMAL HIGH (ref <5.7)
Mean Plasma Glucose: 151 (calc)
eAG (mmol/L): 8.4 (calc)

## 2019-12-04 LAB — LIPID PANEL
Cholesterol: 213 mg/dL — ABNORMAL HIGH (ref <200)
HDL: 50 mg/dL (ref >=40)
LDL Cholesterol (Calc): 119 mg/dL (calc) — ABNORMAL HIGH
Non-HDL Cholesterol (Calc): 163 mg/dL (calc) — ABNORMAL HIGH (ref <130)
Total CHOL/HDL Ratio: 4.3 (calc) (ref <5.0)
Triglycerides: 284 mg/dL — ABNORMAL HIGH (ref <150)

## 2019-12-04 LAB — TSH: TSH: 1.88 mIU/L (ref 0.40–4.50)

## 2019-12-05 NOTE — Assessment & Plan Note (Signed)
Here for CPX DM: Currently on glimepiride either once or twice a day.  Januvia was switch to Tradjenta, also on Metformin, no ambulatory CBGs.  Feet exam negative today. HTN: On Micardis, BP today elevated but is consistently normal at home.  No change. GERD: On PPIs.  Controlled. Palpitations: Ongoing problems, would like further investigation. EKG today: NSR, no change from previous Refer to cardiology RTC 4 months

## 2019-12-06 ENCOUNTER — Encounter: Payer: Self-pay | Admitting: Internal Medicine

## 2019-12-08 ENCOUNTER — Telehealth: Payer: Self-pay

## 2019-12-08 DIAGNOSIS — R7989 Other specified abnormal findings of blood chemistry: Secondary | ICD-10-CM

## 2019-12-08 MED ORDER — ATORVASTATIN CALCIUM 10 MG PO TABS: 10.0000 mg | ORAL_TABLET | Freq: Every day | ORAL | 3 refills | Status: DC

## 2019-12-08 NOTE — Telephone Encounter (Signed)
Lipitor 10mg  sent to pharmacy. BMP ordered.

## 2019-12-08 NOTE — Telephone Encounter (Signed)
Labs discussed: Increase A1c: He has been doing a keto diet for 6 months, evidently that is not working for him.  Recommend to revert to a normal healthy diet or the Mediterranean diet Increased creatinine: Takes meloxicam as needed, sometimes daily, recommend good hydration, DC meloxicam, BMP in 2 weeks High cholesterol: Worse, again probably related to the keto diet.  Start Lipitor, side effects discussed. Medication list: Correction >>> taking glimepiride half tablet daily.  Other medications are correct. Plan:  Send a prescription for Lipitor 10 mg #30 and 3 refills BMP in 2 weeks diagnosis increased creatinine Please arrange

## 2019-12-08 NOTE — Telephone Encounter (Signed)
Pt returning your call. Can be reached at [phone].

## 2019-12-16 ENCOUNTER — Other Ambulatory Visit: Payer: Self-pay | Admitting: Internal Medicine

## 2019-12-16 ENCOUNTER — Encounter: Payer: Self-pay | Admitting: Internal Medicine

## 2019-12-20 MED ORDER — FREESTYLE LIBRE 14 DAY READER DEVI: 1.0000 | 0 refills | Status: DC

## 2019-12-20 MED ORDER — FREESTYLE LIBRE 14 DAY SENSOR MISC: 5 refills | Status: DC

## 2019-12-23 ENCOUNTER — Other Ambulatory Visit (INDEPENDENT_AMBULATORY_CARE_PROVIDER_SITE_OTHER): Payer: 59

## 2019-12-23 ENCOUNTER — Other Ambulatory Visit: Payer: Self-pay

## 2019-12-23 DIAGNOSIS — R7989 Other specified abnormal findings of blood chemistry: Secondary | ICD-10-CM

## 2019-12-23 LAB — BASIC METABOLIC PANEL
BUN: 16 mg/dL (ref 6–23)
CO2: 30 mEq/L (ref 19–32)
Calcium: 9.4 mg/dL (ref 8.4–10.5)
Chloride: 101 mEq/L (ref 96–112)
Creatinine, Ser: 1.05 mg/dL (ref 0.40–1.50)
GFR: 75.51 mL/min (ref >60.00)
Glucose, Bld: 146 mg/dL — ABNORMAL HIGH (ref 70–99)
Potassium: 4.6 mEq/L (ref 3.5–5.1)
Sodium: 136 mEq/L (ref 135–145)

## 2019-12-25 ENCOUNTER — Other Ambulatory Visit: Payer: Self-pay | Admitting: Internal Medicine

## 2019-12-27 ENCOUNTER — Other Ambulatory Visit: Payer: Self-pay

## 2019-12-27 ENCOUNTER — Ambulatory Visit: Payer: 59 | Admitting: Cardiology

## 2019-12-27 ENCOUNTER — Encounter: Payer: Self-pay | Admitting: Cardiology

## 2019-12-27 VITALS — BP 124/86 | HR 86 | Ht 70.0 in | Wt 209.4 lb

## 2019-12-27 DIAGNOSIS — I10 Essential (primary) hypertension: Secondary | ICD-10-CM

## 2019-12-27 DIAGNOSIS — R0600 Dyspnea, unspecified: Secondary | ICD-10-CM

## 2019-12-27 DIAGNOSIS — R002 Palpitations: Secondary | ICD-10-CM

## 2019-12-27 DIAGNOSIS — E119 Type 2 diabetes mellitus without complications: Secondary | ICD-10-CM | POA: Diagnosis not present

## 2019-12-27 DIAGNOSIS — E785 Hyperlipidemia, unspecified: Secondary | ICD-10-CM

## 2019-12-27 DIAGNOSIS — R0609 Other forms of dyspnea: Secondary | ICD-10-CM

## 2019-12-27 HISTORY — DX: Other forms of dyspnea: R06.09

## 2019-12-27 HISTORY — DX: Palpitations: R00.2

## 2019-12-27 HISTORY — DX: Dyspnea, unspecified: R06.00

## 2019-12-27 HISTORY — DX: Hyperlipidemia, unspecified: E78.5

## 2019-12-27 MED ORDER — METFORMIN HCL ER 500 MG PO TB24: 1000.0000 mg | ORAL_TABLET | Freq: Two times a day (BID) | ORAL | 1 refills | Status: DC

## 2019-12-27 NOTE — Patient Instructions (Signed)
Medication Instructions:  Your physician recommends that you continue on your current medications as directed. Please refer to the Current Medication list given to you today.  *If you need a refill on your cardiac medications before your next appointment, please call your pharmacy*   Lab Work: None. If you have labs (blood work) drawn today and your tests are completely normal, you will receive your results only by: . MyChart Message (if you have MyChart) OR . A paper copy in the mail If you have any lab test that is abnormal or we need to change your treatment, we will call you to review the results.   Testing/Procedures: Your physician has requested that you have an echocardiogram. Echocardiography is a painless test that uses sound waves to create images of your heart. It provides your doctor with information about the size and shape of your heart and how well your heart's chambers and valves are working. This procedure takes approximately one hour. There are no restrictions for this procedure.  A zio monitor was ordered today. It will remain on for 7 days. You will then return monitor and event diary in provided box. It takes 1-2 weeks for report to be downloaded and returned to us. We will call you with the results. If monitor falls off or has orange flashing light, please call Zio for further instructions.      Follow-Up: At [facility], you and your health needs are our priority.  As part of our continuing mission to provide you with exceptional heart care, we have created designated Provider Care Teams.  These Care Teams include your primary Cardiologist (physician) and Advanced Practice Providers (APPs -  Physician Assistants and Nurse Practitioners) who all work together to provide you with the care you need, when you need it.  We recommend signing up for the patient portal called "MyChart".  Sign up information is provided on this After Visit Summary.  MyChart is used to  connect with patients for Virtual Visits (Telemedicine).  Patients are able to view lab/test results, encounter notes, upcoming appointments, etc.  Non-urgent messages can be sent to your provider as well.   To learn more about what you can do with MyChart, go to [email or web address].    Your next appointment:   6 week(s)  The format for your next appointment:   In Person  Provider:   Robert Krasowski, MD   Other Instructions   Echocardiogram An echocardiogram is a procedure that uses painless sound waves (ultrasound) to produce an image of the heart. Images from an echocardiogram can provide important information about:  Signs of coronary artery disease (CAD).  Aneurysm detection. An aneurysm is a weak or damaged part of an artery wall that bulges out from the normal force of blood pumping through the body.  Heart size and shape. Changes in the size or shape of the heart can be associated with certain conditions, including heart failure, aneurysm, and CAD.  Heart muscle function.  Heart valve function.  Signs of a past heart attack.  Fluid buildup around the heart.  Thickening of the heart muscle.  A tumor or infectious growth around the heart valves. Tell a health care provider about:  Any allergies you have.  All medicines you are taking, including vitamins, herbs, eye drops, creams, and over-the-counter medicines.  Any blood disorders you have.  Any surgeries you have had.  Any medical conditions you have.  Whether you are pregnant or may be pregnant. What are the risks? Generally,   this is a safe procedure. However, problems may occur, including:  Allergic reaction to dye (contrast) that may be used during the procedure. What happens before the procedure? No specific preparation is needed. You may eat and drink normally. What happens during the procedure?   An IV tube may be inserted into one of your veins.  You may receive contrast through this  tube. A contrast is an injection that improves the quality of the pictures from your heart.  A gel will be applied to your chest.  A wand-like tool (transducer) will be moved over your chest. The gel will help to transmit the sound waves from the transducer.  The sound waves will harmlessly bounce off of your heart to allow the heart images to be captured in real-time motion. The images will be recorded on a computer. The procedure may vary among health care providers and hospitals. What happens after the procedure?  You may return to your normal, everyday life, including diet, activities, and medicines, unless your health care provider tells you not to do that. Summary  An echocardiogram is a procedure that uses painless sound waves (ultrasound) to produce an image of the heart.  Images from an echocardiogram can provide important information about the size and shape of your heart, heart muscle function, heart valve function, and fluid buildup around your heart.  You do not need to do anything to prepare before this procedure. You may eat and drink normally.  After the echocardiogram is completed, you may return to your normal, everyday life, unless your health care provider tells you not to do that. This information is not intended to replace advice given to you by your health care provider. Make sure you discuss any questions you have with your health care provider. Document Revised: 01/14/2019 Document Reviewed: 10/26/2016 Elsevier Patient Education  2020 Elsevier Inc.   

## 2019-12-27 NOTE — Progress Notes (Signed)
Cardiology Consultation:    Date:  12/27/2019   ID:  YUG LORIA, DOB 12/15/1970, MRN [id number]  PCP:  Wanda Plump, MD  Cardiologist:  Gypsy Balsam, MD   Referring MD: Wanda Plump, MD   No chief complaint on file. I have palpitations  History of Present Illness:    Bradley Jefferson is a 49 y.o. male who is being seen today for the evaluation of palpitations at the request of Wanda Plump, MD.  Past medical history significant for diabetes for about 6 years, hypertension, dyslipidemia.  He was referred to [place] because of palpitations for about a year has been experiencing episode of palpitations what he means by that is he can feel his heart speeding up.  Typically onset of this sensation is abrupt offset is abrupt as well can last for up to half a minute to up to a few hours.  He feels weak and tired when he got it but no shortness of breath no chest pain no dizziness no passing out.  Those episodes become more frequent and lasting longer this while he would like to find out what the problem is. He is very active he walks every single day he does have Fitbit and walks every single day between 15,000-30,000 steps.  He has no difficulty doing it.  There is no chest pain tightness squeezing pressure burning chest no shortness of breath when he does it.  Overall he is doing well. His diabetes is fairly controlled and recently his lack of a little bit but now getting back control he lost about 30 pounds within last few months. He does have essential hypertension He does have dyslipidemia, he was tried on statin however developed myalgia and statin has been discontinued. Past Medical History:  Diagnosis Date  . Annual physical exam 07/22/2011  . Diabetes mellitus 03/2009  . DM II (diabetes mellitus, type II), controlled (HCC) 03/07/2009   Patient requested to discontinue Actos and metformin and switch to Byetta 08-2010 Metformin restarted 12-2010 but self discontinued due to GI side effects Feet  exam normal 11-15 Eye care discussed 10-14   . ETD (eustachian tube dysfunction) 01/21/2012  . EXTERNAL HEMORRHOIDS 10/12/2010   Qualifier: Diagnosis of  By: Drue Novel MD, Nolon Rod.   . GERD 03/07/2009   Qualifier: Diagnosis of  By: Shary Decamp    . GERD (gastroesophageal reflux disease)   . Hypertension 07/05/2018  . Low back pain    sees Chiro-herman    Past Surgical History:  Procedure Laterality Date  . abscess anal gland     s/p I&D    Current Medications: Current Meds  Medication Sig  . atorvastatin (LIPITOR) 10 MG tablet Take 1 tablet (10 mg total) by mouth at bedtime.  Marland Kitchen azelastine (ASTELIN) 0.1 % nasal spray Place 2 sprays into both nostrils 2 (two) times daily as needed for rhinitis or allergies. Use in each nostril as directed  . cetirizine (KLS ALLER-TEC) 10 MG tablet Take 10 mg by mouth daily.  . Continuous Blood Gluc Receiver (FREESTYLE LIBRE 14 DAY READER) DEVI 1 Device by Does not apply route as directed.  . Continuous Blood Gluc Sensor (FREESTYLE LIBRE 14 DAY SENSOR) MISC Check blood sugars as directed  . docusate sodium (COLACE) 100 MG capsule Take 100 mg by mouth daily as needed for mild constipation.  . fluticasone (FLONASE) 50 MCG/ACT nasal spray Place 1 spray into both nostrils daily. OTC  . glimepiride (AMARYL) 4 MG tablet Take 1/2  tablet by mouth twice daily as needed  . linagliptin (TRADJENTA) 5 MG TABS tablet Take 1 tablet by mouth daily.  . metFORMIN (GLUCOPHAGE-XR) 500 MG 24 hr tablet Take 2 tablets (1,000 mg total) by mouth 2 (two) times daily with a meal.  . pantoprazole (PROTONIX) 40 MG tablet Take 1 tablet (40 mg total) by mouth daily before breakfast.  . telmisartan (MICARDIS) 80 MG tablet Take 1 tablet (80 mg total) by mouth daily.     Allergies:   Patient has no known allergies.   Social History   Socioeconomic History  . Marital status: Married    Spouse name: Not on file  . Number of children: 0  . Years of education: Not on file  . Highest  education level: Not on file  Occupational History  . Occupation: manage a car parts department    Employer: [employer]  Tobacco Use  . Smoking status: Former Smoker    Quit date: 07/22/1999    Years since quitting: 20.4  . Smokeless tobacco: Never Used  Substance and Sexual Activity  . Alcohol use: Yes    Comment: socially   . Drug use: No  . Sexual activity: Not on file  Other Topics Concern  . Not on file  Social History Narrative   Lives w/ wife, she has h/o breast ca, also dx w/ H. Lymphoma ~ 03-2016, s/p chemo    Social Determinants of Health   Financial Resource Strain:   . Difficulty of Paying Living Expenses:   Food Insecurity:   . Worried About Programme researcher, broadcasting/film/video in the Last Year:   . Barista in the Last Year:   Transportation Needs:   . Freight forwarder (Medical):   Marland Kitchen Lack of Transportation (Non-Medical):   Physical Activity:   . Days of Exercise per Week:   . Minutes of Exercise per Session:   Stress:   . Feeling of Stress :   Social Connections:   . Frequency of Communication with Friends and Family:   . Frequency of Social Gatherings with Friends and Family:   . Attends Religious Services:   . Active Member of Clubs or Organizations:   . Attends Banker Meetings:   Marland Kitchen Marital Status:      Family History: The patient's family history includes CAD (age of onset: 60) in his father; Diabetes in his father; Hyperlipidemia in his father; Stroke (age of onset: 21) in his mother. There is no history of Colon cancer, Colon polyps, or Prostate cancer. ROS:   Please see the history of present illness.    All 14 point review of systems negative except as described per history of present illness.  EKGs/Labs/Other Studies Reviewed:    The following studies were reviewed today: Done by primary care physician showed normal sinus rhythm, normal P interval, normal QS complex duration morphology   Recent Labs: 06/08/2019: ALT  23 12/03/2019: Hemoglobin 15.6; Platelets 219; TSH 1.88 12/23/2019: BUN 16; Creatinine, Ser 1.05; Potassium 4.6; Sodium 136  Recent Lipid Panel    Component Value Date/Time   CHOL 213 (H) 12/03/2019 1607   TRIG 284 (H) 12/03/2019 1607   HDL 50 12/03/2019 1607   CHOLHDL 4.3 12/03/2019 1607   VLDL 54.4 (H) 09/29/2018 0909   LDLCALC 119 (H) 12/03/2019 1607   LDLDIRECT 106.0 09/29/2018 0909    Physical Exam:    VS:  BP 124/86 (BP Location: Right Arm, Patient Position: Sitting, Cuff Size: Normal)  Pulse 86   Ht 5\' 10"  (1.778 m)   Wt 209 lb 6.4 oz (95 kg)   SpO2 97%   BMI 30.05 kg/m     Wt Readings from Last 3 Encounters:  12/27/19 209 lb 6.4 oz (95 kg)  12/03/19 216 lb 6 oz (98.1 kg)  06/08/19 204 lb 2 oz (92.6 kg)     GEN:  Well nourished, well developed in no acute distress HEENT: Normal NECK: No JVD; No carotid bruits LYMPHATICS: No lymphadenopathy CARDIAC: RRR, no murmurs, no rubs, no gallops RESPIRATORY:  Clear to auscultation without rales, wheezing or rhonchi  ABDOMEN: Soft, non-tender, non-distended MUSCULOSKELETAL:  No edema; No deformity  SKIN: Warm and dry NEUROLOGIC:  Alert and oriented x 3 PSYCHIATRIC:  Normal affect   ASSESSMENT:    1. Palpitations   2. Dyspnea on exertion   3. Controlled type 2 diabetes mellitus without complication, without long-term current use of insulin ([place])   4. Essential hypertension   5. Dyslipidemia    PLAN:    In order of problems listed above:  1. Palpitations: I will ask you to wear Zio patch for about a week.  As a part of evaluation I will schedule him to have an echocardiogram to assess his left ventricle ejection fraction.  I will not initiate any therapy until we know the diagnosis. 2. Dyspnea on exertion only minimal.  Echocardiogram will be done to assess his left ventricle ejection fraction. 3. Diabetes better controlled right now he understands a problem trying to stick with the diet and exercise  routine. 4. Essential hypertension blood pressure well controlled continue present management 5. Dyslipidemia: Off statin right now.  Will wait for results of our test before determining therapy.  He need to be on statin ideally.   Medication Adjustments/Labs and Tests Ordered: Current medicines are reviewed at length with the patient today.  Concerns regarding medicines are outlined above.  Orders Placed This Encounter  Procedures  . LONG TERM MONITOR (3-14 DAYS)  . ECHOCARDIOGRAM COMPLETE   No orders of the defined types were placed in this encounter.   Signed, Bradley Liter, MD, [facility]. 12/27/2019 3:58 PM    [place]

## 2020-01-04 ENCOUNTER — Ambulatory Visit (INDEPENDENT_AMBULATORY_CARE_PROVIDER_SITE_OTHER): Payer: 59

## 2020-01-04 ENCOUNTER — Ambulatory Visit (HOSPITAL_BASED_OUTPATIENT_CLINIC_OR_DEPARTMENT_OTHER)
Admission: RE | Admit: 2020-01-04 | Discharge: 2020-01-04 | Disposition: A | Discharge status: Home / Self Care | Payer: 59 | Source: Ambulatory Visit | Attending: Cardiology | Admitting: Cardiology

## 2020-01-04 ENCOUNTER — Other Ambulatory Visit: Payer: Self-pay

## 2020-01-04 DIAGNOSIS — R002 Palpitations: Secondary | ICD-10-CM

## 2020-01-04 NOTE — Progress Notes (Signed)
  Echocardiogram 2D Echocardiogram has been performed.  Sinda Du 01/04/2020, 10:47 AM

## 2020-01-11 ENCOUNTER — Other Ambulatory Visit: Payer: Self-pay | Admitting: Internal Medicine

## 2020-02-14 ENCOUNTER — Ambulatory Visit (INDEPENDENT_AMBULATORY_CARE_PROVIDER_SITE_OTHER): Payer: 59 | Admitting: Cardiology

## 2020-02-14 ENCOUNTER — Encounter: Payer: Self-pay | Admitting: Cardiology

## 2020-02-14 ENCOUNTER — Other Ambulatory Visit: Payer: Self-pay

## 2020-02-14 VITALS — BP 122/88 | HR 60 | Ht 70.0 in | Wt 203.0 lb

## 2020-02-14 DIAGNOSIS — E785 Hyperlipidemia, unspecified: Secondary | ICD-10-CM | POA: Diagnosis not present

## 2020-02-14 DIAGNOSIS — I10 Essential (primary) hypertension: Secondary | ICD-10-CM | POA: Diagnosis not present

## 2020-02-14 DIAGNOSIS — R0609 Other forms of dyspnea: Secondary | ICD-10-CM

## 2020-02-14 DIAGNOSIS — R0600 Dyspnea, unspecified: Secondary | ICD-10-CM | POA: Diagnosis not present

## 2020-02-14 DIAGNOSIS — R002 Palpitations: Secondary | ICD-10-CM | POA: Diagnosis not present

## 2020-02-14 NOTE — Patient Instructions (Signed)
Medication Instructions:  Your physician recommends that you continue on your current medications as directed. Please refer to the Current Medication list given to you today.  *If you need a refill on your cardiac medications before your next appointment, please call your pharmacy*   Lab Work: Your physician recommends that you return for lab work when fasting: lipids   If you have labs (blood work) drawn today and your tests are completely normal, you will receive your results only by: Marland Kitchen MyChart Message (if you have MyChart) OR . A paper copy in the mail If you have any lab test that is abnormal or we need to change your treatment, we will call you to review the results.   Testing/Procedures: None.    Follow-Up: At [facility], you and your health needs are our priority.  As part of our continuing mission to provide you with exceptional heart care, we have created designated Provider Care Teams.  These Care Teams include your primary Cardiologist (physician) and Advanced Practice Providers (APPs -  Physician Assistants and Nurse Practitioners) who all work together to provide you with the care you need, when you need it.  We recommend signing up for the patient portal called "MyChart".  Sign up information is provided on this After Visit Summary.  MyChart is used to connect with patients for Virtual Visits (Telemedicine).  Patients are able to view lab/test results, encounter notes, upcoming appointments, etc.  Non-urgent messages can be sent to your provider as well.   To learn more about what you can do with MyChart, go to [email or web address].    Your next appointment:   5 month(s)  The format for your next appointment:   In Person  Provider:   Gypsy Balsam, MD   Other Instructions

## 2020-02-14 NOTE — Progress Notes (Signed)
Cardiology Office Note:    Date:  02/14/2020   ID:  Bradley Jefferson, DOB 04/07/1971, MRN [id number]  PCP:  Colon Branch, MD  Cardiologist:  Jenne Campus, MD    Referring MD: Colon Branch, MD   Chief Complaint  Patient presents with  . Follow-up  Doing much better  History of Present Illness:    Bradley Jefferson is a 49 y.o. male with past medical history significant for diabetes mellitus, essential hypertension, dyslipidemia.  He was referred to [place] because of palpitations.  Echocardiogram has been done which showed normal left ventricle ejection fraction without significant pathology, he did wear monitor monitor did not show any arrhythmia but he did not have any palpitations on the monitor.  He realized that he if he sleeps well if he does not work long hours like normal he does he is doing much better.  He did a lot of changes in his life started exercising on the regular basis change diet doing much better in terms of diabetes.  He does not have palpitations anymore.  Denies have any chest pain tightness squeezing pressure burning chest no shortness of breath.  He took vacation in April he was able to go to the [place] and walking up only on mild short of breath.  Past Medical History:  Diagnosis Date  . Annual physical exam 07/22/2011  . Diabetes mellitus 03/2009  . DM II (diabetes mellitus, type II), controlled ([place]) 03/07/2009   Patient requested to discontinue Actos and metformin and switch to Byetta 08-2010 Metformin restarted 12-2010 but self discontinued due to GI side effects Feet exam normal 11-15 Eye care discussed 10-14   . ETD (eustachian tube dysfunction) 01/21/2012  . EXTERNAL HEMORRHOIDS 10/12/2010   Qualifier: Diagnosis of  By: Bradley Kells MD, [place] GERD 03/07/2009   Qualifier: Diagnosis of  By: Bradley Jefferson    . GERD (gastroesophageal reflux disease)   . Hypertension 07/05/2018  . Low back pain    sees Chiro-herman    Past Surgical History:  Procedure Laterality Date  .  abscess anal gland     s/p I&D    Current Medications: Current Meds  Medication Sig  . cetirizine (KLS ALLER-TEC) 10 MG tablet Take 10 mg by mouth daily.  . Continuous Blood Gluc Receiver (FREESTYLE LIBRE 14 DAY READER) DEVI 1 Device by Does not apply route as directed.  . Continuous Blood Gluc Sensor (FREESTYLE LIBRE 14 DAY SENSOR) MISC Check blood sugars as directed  . docusate sodium (COLACE) 100 MG capsule Take 100 mg by mouth daily as needed for mild constipation.  . fluticasone (FLONASE) 50 MCG/ACT nasal spray Place 1 spray into both nostrils daily. OTC  . glimepiride (AMARYL) 4 MG tablet Take 1/2 tablet by mouth twice daily as needed  . linagliptin (TRADJENTA) 5 MG TABS tablet Take 1 tablet (5 mg total) by mouth daily.  . metFORMIN (GLUCOPHAGE-XR) 500 MG 24 hr tablet Take 2 tablets (1,000 mg total) by mouth 2 (two) times daily with a meal.  . pantoprazole (PROTONIX) 40 MG tablet Take 1 tablet (40 mg total) by mouth daily before breakfast.  . telmisartan (MICARDIS) 80 MG tablet Take 1 tablet (80 mg total) by mouth daily.     Allergies:   Patient has no known allergies.   Social History   Socioeconomic History  . Marital status: Married    Spouse name: Not on file  . Number of children: 0  . Years of education:  Not on file  . Highest education level: Not on file  Occupational History  . Occupation: manage a car parts department    Employer: [employer]  Tobacco Use  . Smoking status: Former Smoker    Quit date: 07/22/1999    Years since quitting: 20.5  . Smokeless tobacco: Never Used  Substance and Sexual Activity  . Alcohol use: Yes    Comment: socially   . Drug use: No  . Sexual activity: Not on file  Other Topics Concern  . Not on file  Social History Narrative   Lives w/ wife, she has h/o breast ca, also dx w/ H. Lymphoma ~ 03-2016, s/p chemo    Social Determinants of Health   Financial Resource Strain:   . Difficulty of Paying Living Expenses:   Food  Insecurity:   . Worried About Programme researcher, broadcasting/film/video in the Last Year:   . Barista in the Last Year:   Transportation Needs:   . Freight forwarder (Medical):   Marland Kitchen Lack of Transportation (Non-Medical):   Physical Activity:   . Days of Exercise per Week:   . Minutes of Exercise per Session:   Stress:   . Feeling of Stress :   Social Connections:   . Frequency of Communication with Friends and Family:   . Frequency of Social Gatherings with Friends and Family:   . Attends Religious Services:   . Active Member of Clubs or Organizations:   . Attends Banker Meetings:   Marland Kitchen Marital Status:      Family History: The patient's family history includes CAD (age of onset: 92) in his father; Diabetes in his father; Hyperlipidemia in his father; Stroke (age of onset: 76) in his mother. There is no history of Colon cancer, Colon polyps, or Prostate cancer. ROS:   Please see the history of present illness.    All 14 point review of systems negative except as described per history of present illness  EKGs/Labs/Other Studies Reviewed:      Recent Labs: 06/08/2019: ALT 23 12/03/2019: Hemoglobin 15.6; Platelets 219; TSH 1.88 12/23/2019: BUN 16; Creatinine, Ser 1.05; Potassium 4.6; Sodium 136  Recent Lipid Panel    Component Value Date/Time   CHOL 213 (H) 12/03/2019 1607   TRIG 284 (H) 12/03/2019 1607   HDL 50 12/03/2019 1607   CHOLHDL 4.3 12/03/2019 1607   VLDL 54.4 (H) 09/29/2018 0909   LDLCALC 119 (H) 12/03/2019 1607   LDLDIRECT 106.0 09/29/2018 0909    Physical Exam:    VS:  BP 122/88   Pulse 60   Ht 5\' 10"  (1.778 m)   Wt 203 lb (92.1 kg)   SpO2 96%   BMI 29.13 kg/m     Wt Readings from Last 3 Encounters:  02/14/20 203 lb (92.1 kg)  12/27/19 209 lb 6.4 oz (95 kg)  12/03/19 216 lb 6 oz (98.1 kg)     GEN:  Well nourished, well developed in no acute distress HEENT: Normal NECK: No JVD; No carotid bruits LYMPHATICS: No lymphadenopathy CARDIAC: RRR, no  murmurs, no rubs, no gallops RESPIRATORY:  Clear to auscultation without rales, wheezing or rhonchi  ABDOMEN: Soft, non-tender, non-distended MUSCULOSKELETAL:  No edema; No deformity  SKIN: Warm and dry LOWER EXTREMITIES: no swelling NEUROLOGIC:  Alert and oriented x 3 PSYCHIATRIC:  Normal affect   ASSESSMENT:    1. Dyslipidemia   2. Palpitations   3. Dyspnea on exertion   4. Essential hypertension  PLAN:    In order of problems listed above:  1. Palpitations.  None recorded on the monitor.  He changed his lifestyle and that seems to be helping.  Echocardiogram showed preserved left ventricle ejection fraction therefore she is at low risk of having some serious arrhythmia.  I talked to him about potentially getting apple watch and recording his EKG when he has palpitation.  He said he will do that.  I will not initiate any therapy at this stage. 2. Dyslipidemia he is on moderate intensity statin in form of Lipitor 10.  This is appropriate move.  We will recheck his fasting lipid profile.  He also change his diet. 3. Dyspnea on exertion.  Doing well right now echocardiogram showed preserved left ventricular ejection fraction, denies have any chest pain. 4. Essential hypertension blood pressure well controlled continue present management.  We did talk a lot about healthy lifestyle which includes proper rest, exercise as well as diet.  He is already doing this and he did see benefits of it.   Medication Adjustments/Labs and Tests Ordered: Current medicines are reviewed at length with the patient today.  Concerns regarding medicines are outlined above.  Orders Placed This Encounter  Procedures  . Lipid Profile   Medication changes: No orders of the defined types were placed in this encounter.   Signed, Georgeanna Lea, MD, [facility] 02/14/2020 8:36 AM    [facility]

## 2020-02-22 ENCOUNTER — Encounter: Payer: Self-pay | Admitting: Internal Medicine

## 2020-02-22 ENCOUNTER — Ambulatory Visit (INDEPENDENT_AMBULATORY_CARE_PROVIDER_SITE_OTHER): Payer: 59 | Admitting: Internal Medicine

## 2020-02-22 ENCOUNTER — Other Ambulatory Visit: Payer: Self-pay

## 2020-02-22 VITALS — BP 111/74 | HR 74 | Temp 96.9°F | Resp 18 | Ht 70.0 in | Wt 201.1 lb

## 2020-02-22 DIAGNOSIS — G4700 Insomnia, unspecified: Secondary | ICD-10-CM | POA: Diagnosis not present

## 2020-02-22 DIAGNOSIS — E119 Type 2 diabetes mellitus without complications: Secondary | ICD-10-CM | POA: Diagnosis not present

## 2020-02-22 DIAGNOSIS — E785 Hyperlipidemia, unspecified: Secondary | ICD-10-CM

## 2020-02-22 LAB — HEMOGLOBIN A1C: Hgb A1c MFr Bld: 5.9 % (ref 4.6–6.5)

## 2020-02-22 LAB — LIPID PANEL
Cholesterol: 126 mg/dL (ref 0–200)
HDL: 32.6 mg/dL — ABNORMAL LOW (ref >39.00)
LDL Cholesterol: 60 mg/dL (ref 0–99)
NonHDL: 93.05
Total CHOL/HDL Ratio: 4
Triglycerides: 166 mg/dL — ABNORMAL HIGH (ref 0.0–149.0)
VLDL: 33.2 mg/dL (ref 0.0–40.0)

## 2020-02-22 MED ORDER — ALPRAZOLAM 0.5 MG PO TABS: 0.5000 mg | ORAL_TABLET | Freq: Every evening | ORAL | 1 refills | Status: DC | PRN

## 2020-02-22 NOTE — Progress Notes (Signed)
Subjective:    Patient ID: Bradley Jefferson, male    DOB: [birth year], 49 y.o.   MRN: [id number]  DOS:  02/22/2020 Type of visit - description: Follow-up  Today we talk about diabetes, high cholesterol, notes from cardiology reviewed.  He also has insomnia, he has poor sleep and is not able to sleep more than 4 to 5 hours. He eventually tried Xanax from his wife, 0.5 mg as needed and he had a very good night sleep. The next day did not have any excessive drowsiness. By sleeping better he feels  he can function better overall.  Wt Readings from Last 3 Encounters:  02/22/20 201 lb 2 oz (91.2 kg)  02/14/20 203 lb (92.1 kg)  12/27/19 209 lb 6.4 oz (95 kg)     Review of Systems Denies anxiety or depression per se, during the daytime he is very busy and at nighttime has a hard time "shutting down".   Past Medical History:  Diagnosis Date  . Annual physical exam 07/22/2011  . Diabetes mellitus 03/2009  . DM II (diabetes mellitus, type II), controlled ([place]) 03/07/2009   Patient requested to discontinue Actos and metformin and switch to Byetta 08-2010 Metformin restarted 12-2010 but self discontinued due to GI side effects Feet exam normal 11-15 Eye care discussed 10-14   . ETD (eustachian tube dysfunction) 01/21/2012  . EXTERNAL HEMORRHOIDS 10/12/2010   Qualifier: Diagnosis of  By: Larose Kells MD, [place] GERD 03/07/2009   Qualifier: Diagnosis of  By: Dawson Bills    . GERD (gastroesophageal reflux disease)   . Hypertension 07/05/2018  . Low back pain    sees Chiro-herman    Past Surgical History:  Procedure Laterality Date  . abscess anal gland     s/p I&D    Allergies as of 02/22/2020   No Known Allergies     Medication List       Accurate as of Feb 22, 2020 11:59 PM. If you have any questions, ask your nurse or doctor.        ALPRAZolam 0.5 MG tablet Commonly known as: XANAX Take 1 tablet (0.5 mg total) by mouth at bedtime as needed for anxiety. Started by: Kathlene November, MD    docusate sodium 100 MG capsule Commonly known as: COLACE Take 100 mg by mouth daily as needed for mild constipation.   fluticasone 50 MCG/ACT nasal spray Commonly known as: FLONASE Place 1 spray into both nostrils daily. OTC   FreeStyle Libre 14 Day Reader Devi 1 Device by Does not apply route as directed.   FreeStyle Libre 14 Day Sensor Misc Check blood sugars as directed   glimepiride 4 MG tablet Commonly known as: AMARYL Take 1/2 tablet by mouth twice daily as needed   KLS Aller-Tec 10 MG tablet Generic drug: cetirizine Take 10 mg by mouth daily.   linagliptin 5 MG Tabs tablet Commonly known as: Tradjenta Take 1 tablet (5 mg total) by mouth daily.   metFORMIN 500 MG 24 hr tablet Commonly known as: GLUCOPHAGE-XR Take 2 tablets (1,000 mg total) by mouth 2 (two) times daily with a meal.   pantoprazole 40 MG tablet Commonly known as: PROTONIX Take 1 tablet (40 mg total) by mouth daily before breakfast.   telmisartan 80 MG tablet Commonly known as: MICARDIS Take 1 tablet (80 mg total) by mouth daily.          Objective:   Physical Exam BP 111/74 (BP Location: Left Arm, Patient Position: Sitting,  Cuff Size: Small)   Pulse 74   Temp (!) 96.9 F (36.1 C) (Temporal)   Resp 18   Ht 5\' 10"  (1.778 m)   Wt 201 lb 2 oz (91.2 kg)   SpO2 100%   BMI 28.86 kg/m  General:   Well developed, NAD, BMI noted. HEENT:  Normocephalic . Face symmetric, atraumatic Lungs:  CTA B Normal respiratory effort, no intercostal retractions, no accessory muscle use. Heart: RRR,  no murmur.  Lower extremities: no pretibial edema bilaterally  Skin: Not pale. Not jaundice Neurologic:  alert & oriented X3.  Speech normal, gait appropriate for age and unassisted Psych--  Cognition and judgment appear intact.  Cooperative with normal attention span and concentration.  Behavior appropriate. No anxious or depressed appearing.      Assessment    Assessment   DM 2010, no  neuropathy HTN dx 2-17 GERD Occasional low back pain, sees a chiropractor. Occasional increase LFTs: Hepatitis B and C negative 12-2016 Ear eczema: Occasionally uses triamcinolone. COVID-25 September 2019  PLAN DM: Last A1c increased to 6.9 in the context of doing a keto diet, was recommended to go back to balance/healthy possibly Mediterranean diet which he is doing.  Has lost some weight Continue glimepiride, Tradjenta, Metformin.  Check A1c High cholesterol: Last FLP shows LDL of 118, worse than before, again in the context of a keto diet, Lipitor was recommended, he developed severe leg cramps and quickly stopped it.  Doing better with diet, request FLP recheck. He understands that essentially every diabetic patient needs to be on a statin.  Consider Pravachol versus low-dose Crestor Palpitations: Since the last visit, saw cardiology, echo/monitor essentially negative,now asx. Insomnia: See HPI, long history of difficulty sleeping, mostly he is unable to "shut down" his brain at night because he is very busy during the daytime, he tried his wife's Xanax and it helped tremendously. Epworth scale and PHQ-9 negative Plan: Contract signed, prescribed Xanax.  Warned about possibility of abuse.  He accepted that. Preventive care: Status post J&J Covid vaccination RTC 3 months        This visit occurred during the SARS-CoV-2 public health emergency.  Safety protocols were in place, including screening questions prior to the visit, additional usage of staff PPE, and extensive cleaning of exam room while observing appropriate contact time as indicated for disinfecting solutions.

## 2020-02-22 NOTE — Patient Instructions (Addendum)
Per our records you are due for an eye exam. Please contact your eye doctor to schedule an appointment. Please have them send copies of your office visit notes to [place]. Our fax number is [phone].  Take alprazolam as needed  GO TO THE LAB : Get the blood work     GO TO THE FRONT DESK, PLEASE SCHEDULE YOUR APPOINTMENTS Come back for for checkup in 3 months

## 2020-02-22 NOTE — Progress Notes (Signed)
Pre visit review using our clinic review tool, if applicable. No additional management support is needed unless otherwise documented below in the visit note. 

## 2020-02-23 DIAGNOSIS — G4700 Insomnia, unspecified: Secondary | ICD-10-CM

## 2020-02-23 HISTORY — DX: Insomnia, unspecified: G47.00

## 2020-02-23 NOTE — Assessment & Plan Note (Signed)
DM: Last A1c increased to 6.9 in the context of doing a keto diet, was recommended to go back to balance/healthy possibly Mediterranean diet which he is doing.  Has lost some weight Continue glimepiride, Tradjenta, Metformin.  Check A1c High cholesterol: Last FLP shows LDL of 118, worse than before, again in the context of a keto diet, Lipitor was recommended, he developed severe leg cramps and quickly stopped it.  Doing better with diet, request FLP recheck. He understands that essentially every diabetic patient needs to be on a statin.  Consider Pravachol versus low-dose Crestor Palpitations: Since the last visit, saw cardiology, echo/monitor essentially negative,now asx. Insomnia: See HPI, long history of difficulty sleeping, mostly he is unable to "shut down" his brain at night because he is very busy during the daytime, he tried his wife's Xanax and it helped tremendously. Epworth scale and PHQ-9 negative Plan: Contract signed, prescribed Xanax.  Warned about possibility of abuse.  He accepted that. Preventive care: Status post J&J Covid vaccination RTC 3 months

## 2020-02-24 NOTE — Addendum Note (Signed)
Addended byConrad [place] D on: 02/24/2020 01:00 PM   Modules accepted: Orders

## 2020-02-29 ENCOUNTER — Other Ambulatory Visit: Payer: Self-pay | Admitting: Internal Medicine

## 2020-04-16 ENCOUNTER — Other Ambulatory Visit: Payer: Self-pay

## 2020-04-16 ENCOUNTER — Emergency Department
Admission: RE | Admit: 2020-04-16 | Discharge: 2020-04-16 | Disposition: A | Discharge status: Home / Self Care | Payer: 59 | Source: Ambulatory Visit

## 2020-04-16 VITALS — BP 136/94 | HR 71 | Temp 98.4°F | Resp 15

## 2020-04-16 DIAGNOSIS — T63441A Toxic effect of venom of bees, accidental (unintentional), initial encounter: Secondary | ICD-10-CM | POA: Diagnosis not present

## 2020-04-16 DIAGNOSIS — T63461A Toxic effect of venom of wasps, accidental (unintentional), initial encounter: Secondary | ICD-10-CM

## 2020-04-16 DIAGNOSIS — I4891 Unspecified atrial fibrillation: Secondary | ICD-10-CM

## 2020-04-16 MED ORDER — TRIAMCINOLONE ACETONIDE 0.1 % EX CREA: 1.0000 "application " | TOPICAL_CREAM | Freq: Two times a day (BID) | CUTANEOUS | 0 refills | Status: DC

## 2020-04-16 MED ORDER — CEPHALEXIN 500 MG PO CAPS: 500.0000 mg | ORAL_CAPSULE | Freq: Two times a day (BID) | ORAL | 0 refills | Status: DC

## 2020-04-16 NOTE — ED Provider Notes (Signed)
[facility]    CSN: [id number] Arrival date & time: 04/16/20  1031      History   Chief Complaint Chief Complaint  Patient presents with  . Appointment  . Cellulitis    HPI Bradley Jefferson is a 49 y.o. male.   HPI Bradley Jefferson is a 49 y.o. male presenting to UC with c/o redness and swelling of Left lower leg after being stung by a yellow jacket two days ago.  Swelling and redness has gradually improved with taking 1-2 benadryl every 4 hours and using topical hydrocortisone/benadryl cream.  Pt also used an ace wrap and ice and elevation.  Pt concerned the redness is still present and has hx of diabetes. He has had cellulitis in the past. He wants to make sure he does not need an antibiotic. Denies fever, chills, n/v/d sugars have been under control, 140 in triage today.   Past Medical History:  Diagnosis Date  . Annual physical exam 07/22/2011  . Diabetes mellitus 03/2009  . DM II (diabetes mellitus, type II), controlled ([facility]) 03/07/2009   Patient requested to discontinue Actos and metformin and switch to Byetta 08-2010 Metformin restarted 12-2010 but self discontinued due to GI side effects Feet exam normal 11-15 Eye care discussed 10-14   . ETD (eustachian tube dysfunction) 01/21/2012  . EXTERNAL HEMORRHOIDS 10/12/2010   Qualifier: Diagnosis of  By: Drue Novel MD, Nolon Rod.   . GERD 03/07/2009   Qualifier: Diagnosis of  By: Shary Decamp    . GERD (gastroesophageal reflux disease)   . Hypertension 07/05/2018  . Low back pain    sees Chiro-herman    Patient Active Problem List   Diagnosis Date Noted  . Insomnia 02/23/2020  . Palpitations 12/27/2019  . Dyspnea on exertion 12/27/2019  . Dyslipidemia 12/27/2019  . Hypertension 07/05/2018  . PCP NOTES >>>>> 08/15/2015  . ETD (eustachian tube dysfunction) 01/21/2012  . Annual physical exam 07/22/2011  . EXTERNAL HEMORRHOIDS 10/12/2010  . DM II (diabetes mellitus, type II), controlled ([facility]) 03/07/2009  . GERD 03/07/2009     Past Surgical History:  Procedure Laterality Date  . abscess anal gland     s/p I&D       Home Medications    Prior to Admission medications   Medication Sig Start Date End Date Taking? Authorizing Provider  ALPRAZolam Prudy Feeler) 0.5 MG tablet Take 1 tablet (0.5 mg total) by mouth at bedtime as needed for anxiety. 02/22/20  Yes Paz, Nolon Rod, MD  cetirizine (KLS ALLER-TEC) 10 MG tablet Take 10 mg by mouth daily. 12/06/18  Yes [provider]  fluticasone (FLONASE) 50 MCG/ACT nasal spray Place 1 spray into both nostrils daily. OTC   Yes [provider]  linagliptin (TRADJENTA) 5 MG TABS tablet Take 1 tablet (5 mg total) by mouth daily. 01/11/20  Yes Wanda Plump, MD  pantoprazole (PROTONIX) 40 MG tablet Take 1 tablet (40 mg total) by mouth daily before breakfast. 08/23/19  Yes Paz, Nolon Rod, MD  telmisartan (MICARDIS) 80 MG tablet Take 1 tablet (80 mg total) by mouth daily. 12/27/19  Yes Paz, Nolon Rod, MD  cephALEXin (KEFLEX) 500 MG capsule Take 1 capsule (500 mg total) by mouth 2 (two) times daily. 04/16/20   Lurene Shadow, PA-C  Continuous Blood Gluc Receiver (FREESTYLE LIBRE 14 DAY READER) DEVI 1 Device by Does not apply route as directed. 12/20/19   Wanda Plump, MD  Continuous Blood Gluc Sensor (FREESTYLE LIBRE 14 DAY SENSOR) MISC  Check blood sugars as directed 12/20/19   Wanda Plump, MD  docusate sodium (COLACE) 100 MG capsule Take 100 mg by mouth daily as needed for mild constipation.    [provider]  metFORMIN (GLUCOPHAGE-XR) 500 MG 24 hr tablet Take 2 tablets (1,000 mg total) by mouth 2 (two) times daily with a meal. 12/27/19   Wanda Plump, MD  triamcinolone cream (KENALOG) 0.1 % Apply 1 application topically 2 (two) times daily. 04/16/20   Lurene Shadow, PA-C    Family History Family History  Problem Relation Age of Onset  . Diabetes Father        Father brother (age 27)  . Hyperlipidemia Father   . CAD Father 16       first MI age 66  . Stroke Mother 30   . Colon cancer Neg Hx   . Colon polyps Neg Hx   . Prostate cancer Neg Hx     Social History Social History   Tobacco Use  . Smoking status: Former Smoker    Quit date: 07/22/1999    Years since quitting: 20.7  . Smokeless tobacco: Never Used  Substance Use Topics  . Alcohol use: Yes    Comment: socially   . Drug use: No     Allergies   Patient has no known allergies.   Review of Systems Review of Systems  Constitutional: Negative for chills and fever.  Musculoskeletal: Negative for arthralgias and joint swelling.  Skin: Positive for color change.     Physical Exam Triage Vital Signs ED Triage Vitals  Enc Vitals Group     BP 04/16/20 1122 (!) 136/94     Pulse Rate 04/16/20 1122 71     Resp 04/16/20 1122 15     Temp 04/16/20 1122 98.4 F (36.9 C)     Temp Source 04/16/20 1122 Oral     SpO2 04/16/20 1122 99 %     Weight --      Height --      Head Circumference --      Peak Flow --      Pain Score 04/16/20 1123 2     Pain Loc --      Pain Edu? --      Excl. in GC? --    No data found.  Updated Vital Signs BP (!) 136/94 (BP Location: Right Arm)   Pulse 71   Temp 98.4 F (36.9 C) (Oral)   Resp 15   SpO2 99%   Visual Acuity Right Eye Distance:   Left Eye Distance:   Bilateral Distance:    Right Eye Near:   Left Eye Near:    Bilateral Near:     Physical Exam Vitals and nursing note reviewed.  Constitutional:      Appearance: He is well-developed.  HENT:     Head: Normocephalic and atraumatic.  Cardiovascular:     Rate and Rhythm: Normal rate.  Pulmonary:     Effort: Pulmonary effort is normal.  Musculoskeletal:        General: Swelling and tenderness present. Normal range of motion.     Cervical back: Normal range of motion.     Comments: Left lower leg: mild edema, mild tenderness  Full ROM knee and ankle. Calf is soft, non-tender.  Skin:    General: Skin is warm and dry.     Findings: Erythema present.       Neurological:      Mental Status: He is  alert and oriented to person, place, and time.  Psychiatric:        Behavior: Behavior normal.      UC Treatments / Results  Labs (all labs ordered are listed, but only abnormal results are displayed) Labs Reviewed - No data to display  EKG   Radiology No results found.  Procedures Procedures (including critical care time)  Medications Ordered in UC Medications - No data to display  Initial Impression / Assessment and Plan / UC Course  I have reviewed the triage vital signs and the nursing notes.  Pertinent labs & imaging results that were available during my care of the patient were reviewed by me and considered in my medical decision making (see chart for details).     Pt notes the area of redness and swelling has gradually improved each day Only minimal tenderness today, suspected localized allergic reaction to be sting. Encouraged to continue treatments of elevation and compression at home along with triamcinolone cream and benadryl as needed. Prescription to hold for keflex, pt to fill if area stops improving or becomes painful, drainage of pus, or fever. F/u PCP 1 week AVS given  Final Clinical Impressions(s) / UC Diagnoses   Final diagnoses:  Yellow jacket sting, accidental or unintentional, initial encounter  Local reaction to bee sting, accidental or unintentional, initial encounter     Discharge Instructions      Keep wound clean with warm water and mild soap, pat dry, do not rub.  You can use the triamcinolone cream as prescribed to help with itching and inflammation.  Because there is no pain to the area and it has been getting better each day, you do not need antibiotics at this time. However, an antibiotic has been sent to your pharmacy to use if redness, swelling, pain start to worsen, you develop fever, or drainage from the wound, or if continuing to improve each day.  Follow up with family medicine by the end of the week if  needed.    ED Prescriptions    Medication Sig Dispense Auth. Provider   cephALEXin (KEFLEX) 500 MG capsule Take 1 capsule (500 mg total) by mouth 2 (two) times daily. 14 capsule Waylan Rocher O, PA-C   triamcinolone cream (KENALOG) 0.1 % Apply 1 application topically 2 (two) times daily. 30 g Lurene Shadow, [place]     PDMP not reviewed this encounter.   Lurene Shadow, [place] 04/17/20 [id number]

## 2020-04-16 NOTE — ED Triage Notes (Signed)
Stung by a yellow jacket on Thursday evening on left lower leg  Presents today w/ redness & swelling has gone down per pt Pt has been taking 1 -2 benadryl PO every 4 hours last dose 1 tab at 0400 Hydrocortisone/benadryl topical  Ace wrap & ice since Friday No fever Pt is diabetic - BS w/in norms ( has a freestyle to monitor blood sugar - ate today 140 in triage) COVID vaccine (J & J April)

## 2020-04-16 NOTE — Discharge Instructions (Signed)
  Keep wound clean with warm water and mild soap, pat dry, do not rub.  You can use the triamcinolone cream as prescribed to help with itching and inflammation.  Because there is no pain to the area and it has been getting better each day, you do not need antibiotics at this time. However, an antibiotic has been sent to your pharmacy to use if redness, swelling, pain start to worsen, you develop fever, or drainage from the wound, or if continuing to improve each day.  Follow up with family medicine by the end of the week if needed.

## 2020-04-18 ENCOUNTER — Encounter: Payer: Self-pay | Admitting: Internal Medicine

## 2020-04-24 ENCOUNTER — Ambulatory Visit (INDEPENDENT_AMBULATORY_CARE_PROVIDER_SITE_OTHER): Payer: 59

## 2020-04-24 ENCOUNTER — Encounter: Payer: Self-pay | Admitting: Cardiology

## 2020-04-24 ENCOUNTER — Ambulatory Visit: Payer: 59 | Admitting: Cardiology

## 2020-04-24 ENCOUNTER — Other Ambulatory Visit: Payer: Self-pay | Admitting: Cardiology

## 2020-04-24 ENCOUNTER — Other Ambulatory Visit: Payer: Self-pay

## 2020-04-24 VITALS — BP 138/82 | HR 74 | Ht 70.0 in | Wt 205.2 lb

## 2020-04-24 DIAGNOSIS — R0600 Dyspnea, unspecified: Secondary | ICD-10-CM

## 2020-04-24 DIAGNOSIS — Z5181 Encounter for therapeutic drug level monitoring: Secondary | ICD-10-CM | POA: Diagnosis not present

## 2020-04-24 DIAGNOSIS — I4891 Unspecified atrial fibrillation: Secondary | ICD-10-CM

## 2020-04-24 DIAGNOSIS — E785 Hyperlipidemia, unspecified: Secondary | ICD-10-CM | POA: Diagnosis not present

## 2020-04-24 DIAGNOSIS — I480 Paroxysmal atrial fibrillation: Secondary | ICD-10-CM

## 2020-04-24 DIAGNOSIS — R0609 Other forms of dyspnea: Secondary | ICD-10-CM

## 2020-04-24 DIAGNOSIS — R400 Somnolence: Secondary | ICD-10-CM

## 2020-04-24 DIAGNOSIS — E119 Type 2 diabetes mellitus without complications: Secondary | ICD-10-CM

## 2020-04-24 DIAGNOSIS — I10 Essential (primary) hypertension: Secondary | ICD-10-CM

## 2020-04-24 DIAGNOSIS — R002 Palpitations: Secondary | ICD-10-CM

## 2020-04-24 DIAGNOSIS — Z7901 Long term (current) use of anticoagulants: Secondary | ICD-10-CM

## 2020-04-24 HISTORY — DX: Paroxysmal atrial fibrillation: I48.0

## 2020-04-24 MED ORDER — RIVAROXABAN 20 MG PO TABS
20.0000 mg | ORAL_TABLET | Freq: Every day | ORAL | 6 refills | Status: DC
Start: 2020-04-24 — End: 2020-11-08

## 2020-04-24 NOTE — Progress Notes (Signed)
Cardiology Office Note:    Date:  04/24/2020   ID:  Bradley Jefferson, DOB 28-Jun-1971, MRN [id number]  PCP:  Wanda Plump, MD  Cardiologist:  Gypsy Balsam, MD    Referring MD: Wanda Plump, MD   No chief complaint on file. I have atrial fibrillation  History of Present Illness:    Bradley Jefferson is a 49 y.o. male with past medical history significant for diabetes mellitus, essential hypertension, dyslipidemia.  He was referred to [place] initially because of palpitations.  He did wear monitor however during the time he did not have any palpitations.  No diagnosis has been established.  He did purchase a fit bit with her ability to record his EKG and his sent few recordings to me which showed clearly atrial fibrillation.  He said atrial fibrillation typically happen at evening time and now it is happening almost every day.  Denies having any dizziness passing out chest pain shortness of breath when he had episode does feel his heart speeding up.  Past Medical History:  Diagnosis Date  . Annual physical exam 07/22/2011  . Diabetes mellitus 03/2009  . DM II (diabetes mellitus, type II), controlled (HCC) 03/07/2009   Patient requested to discontinue Actos and metformin and switch to Byetta 08-2010 Metformin restarted 12-2010 but self discontinued due to GI side effects Feet exam normal 11-15 Eye care discussed 10-14   . ETD (eustachian tube dysfunction) 01/21/2012  . EXTERNAL HEMORRHOIDS 10/12/2010   Qualifier: Diagnosis of  By: Drue Novel MD, Nolon Rod.   . GERD 03/07/2009   Qualifier: Diagnosis of  By: Shary Decamp    . GERD (gastroesophageal reflux disease)   . Hypertension 07/05/2018  . Low back pain    sees Chiro-[facility]man    Past Surgical History:  Procedure Laterality Date  . abscess anal gland     s/p I&D    Current Medications: Current Meds  Medication Sig  . ALPRAZolam (XANAX) 0.5 MG tablet Take 1 tablet (0.5 mg total) by mouth at bedtime as needed for anxiety.  . cetirizine (KLS ALLER-TEC) 10 MG  tablet Take 10 mg by mouth daily.  . Continuous Blood Gluc Receiver (FREESTYLE LIBRE 14 DAY READER) DEVI 1 Device by Does not apply route as directed.  . Continuous Blood Gluc Sensor (FREESTYLE LIBRE 14 DAY SENSOR) MISC Check blood sugars as directed  . docusate sodium (COLACE) 100 MG capsule Take 100 mg by mouth daily as needed for mild constipation.  . fluticasone (FLONASE) 50 MCG/ACT nasal spray Place 1 spray into both nostrils as needed. OTC   . glimepiride (AMARYL) 2 MG tablet Take 2 mg by mouth daily with breakfast.  . linagliptin (TRADJENTA) 5 MG TABS tablet Take 1 tablet (5 mg total) by mouth daily.  . pantoprazole (PROTONIX) 40 MG tablet Take 1 tablet (40 mg total) by mouth daily before breakfast.  . telmisartan (MICARDIS) 80 MG tablet Take 1 tablet (80 mg total) by mouth daily.  Marland Kitchen triamcinolone cream (KENALOG) 0.1 % Apply 1 application topically 2 (two) times daily. (Patient taking differently: Apply 1 application topically as needed. )     Allergies:   Patient has no known allergies.   Social History   Socioeconomic History  . Marital status: Married    Spouse name: Not on file  . Number of children: 0  . Years of education: Not on file  . Highest education level: Not on file  Occupational History  . Occupation: manage a car parts department  Employer: [employer]  Tobacco Use  . Smoking status: Former Smoker    Quit date: 07/22/1999    Years since quitting: 20.7  . Smokeless tobacco: Never Used  Substance and Sexual Activity  . Alcohol use: Yes    Comment: socially   . Drug use: No  . Sexual activity: Not on file  Other Topics Concern  . Not on file  Social History Narrative   Lives w/ wife, she has h/o breast ca, also dx w/ H. Lymphoma ~ 03-2016, s/p chemo    Social Determinants of Health   Financial Resource Strain:   . Difficulty of Paying Living Expenses:   Food Insecurity:   . Worried About Programme researcher, broadcasting/film/video in the Last Year:   . Engineer, site in the Last Year:   Transportation Needs:   . Freight forwarder (Medical):   Marland Kitchen Lack of Transportation (Non-Medical):   Physical Activity:   . Days of Exercise per Week:   . Minutes of Exercise per Session:   Stress:   . Feeling of Stress :   Social Connections:   . Frequency of Communication with Friends and Family:   . Frequency of Social Gatherings with Friends and Family:   . Attends Religious Services:   . Active Member of Clubs or Organizations:   . Attends Banker Meetings:   Marland Kitchen Marital Status:      Family History: The patient's family history includes CAD (age of onset: 56) in his father; Diabetes in his father; Hyperlipidemia in his father; Stroke (age of onset: 53) in his mother. There is no history of Colon cancer, Colon polyps, or Prostate cancer. ROS:   Please see the history of present illness.    All 14 point review of systems negative except as described per history of present illness  EKGs/Labs/Other Studies Reviewed:      Recent Labs: 06/08/2019: ALT 23 12/03/2019: Hemoglobin 15.6; Platelets 219; TSH 1.88 12/23/2019: BUN 16; Creatinine, Ser 1.05; Potassium 4.6; Sodium 136  Recent Lipid Panel    Component Value Date/Time   CHOL 126 02/22/2020 0939   TRIG 166.0 (H) 02/22/2020 0939   HDL 32.60 (L) 02/22/2020 0939   CHOLHDL 4 02/22/2020 0939   VLDL 33.2 02/22/2020 0939   LDLCALC 60 02/22/2020 0939   LDLCALC 119 (H) 12/03/2019 1607   LDLDIRECT 106.0 09/29/2018 0909    Physical Exam:    VS:  BP 138/82 (BP Location: Left Arm, Patient Position: Sitting, Cuff Size: Normal)   Pulse 74   Ht 5\' 10"  (1.778 m)   Wt 205 lb 3.2 oz (93.1 kg)   SpO2 97%   BMI 29.44 kg/m     Wt Readings from Last 3 Encounters:  04/24/20 205 lb 3.2 oz (93.1 kg)  02/22/20 201 lb 2 oz (91.2 kg)  02/14/20 203 lb (92.1 kg)     GEN:  Well nourished, well developed in no acute distress HEENT: Normal NECK: No JVD; No carotid bruits LYMPHATICS: No  lymphadenopathy CARDIAC: RRR, no murmurs, no rubs, no gallops RESPIRATORY:  Clear to auscultation without rales, wheezing or rhonchi  ABDOMEN: Soft, non-tender, non-distended MUSCULOSKELETAL:  No edema; No deformity  SKIN: Warm and dry LOWER EXTREMITIES: no swelling NEUROLOGIC:  Alert and oriented x 3 PSYCHIATRIC:  Normal affect   ASSESSMENT:    1. Atrial fibrillation, unspecified type (HCC)   2. Encounter for monitoring bridging anticoagulation therapy   3. Paroxysmal atrial fibrillation (HCC)   4. Dyslipidemia  5. Dyspnea on exertion   6. Palpitations   7. Controlled type 2 diabetes mellitus without complication, without long-term current use of insulin (HCC)   8. Essential hypertension    PLAN:    In order of problems listed above:  1. Paroxysmal atrial fibrillation: This is a new diagnosis.  His chads 2 vascular equals 2.  He needs to be anticoagulated.  I will check his CBC Chem-7 as well as stool for guaiac.  We will initiate therapy with Xarelto 20.  He is echocardiogram from 2021 March showed normal left ventricle ejection fraction.  Atrial sizes were normal.  I will also schedule him to have a sleep study.  He does have excessive daytime somnolence on top of that he snores I suspect atrial fibrillation may be trigger for his atrial fibrillation.  Monitor will also show me how frequent atrial fibrillation is and based on that we will decide if we need to use any antiarrhythmic therapy. 2. Essential hypertension: Blood pressure seems to be well controlled.  We will continue present management. 3. Dyslipidemia he changed his diet dramatically and his last fasting lipid profile from [facility] it is from 18 May show LDL of 60 and HDL of 32.  He is cholesterol medication has been withdrawn because of good cholesterol as well as because of some symptoms of muscle aches in the lower extremities.  In my opinion he need to be on at least on moderate intensity statin because of his diabetes.   This is something we will continue talking about during next visit.   Medication Adjustments/Labs and Tests Ordered: Current medicines are reviewed at length with the patient today.  Concerns regarding medicines are outlined above.  Orders Placed This Encounter  Procedures  . Fecal occult blood, imunochemical([facility])  . CBC with Differential/Platelet  . Basic metabolic panel  . LONG TERM MONITOR (3-14 DAYS)   Medication changes:  Meds ordered this encounter  Medications  . rivaroxaban (XARELTO) 20 MG TABS tablet    Sig: Take 1 tablet (20 mg total) by mouth daily with supper.    Dispense:  30 tablet    Refill:  6    Signed, Georgeanna Lea, MD, [facility] 04/24/2020 4:50 PM     [facility]

## 2020-04-24 NOTE — Patient Instructions (Addendum)
Medication Instructions:  Your physician has recommended you make the following change in your medication:   Start Xarelto 20 mg daily.  *If you need a refill on your cardiac medications before your next appointment, please call your pharmacy*   Lab Work: Your physician recommends that you have labs done in the office today. Your test included  basic metabolic panel and complete blood count.  If you have labs (blood work) drawn today and your tests are completely normal, you will receive your results only by: Marland Kitchen MyChart Message (if you have MyChart) OR . A paper copy in the mail If you have any lab test that is abnormal or we need to change your treatment, we will call you to review the results.   Testing/Procedures:  WHY IS MY DOCTOR PRESCRIBING ZIO? The Zio system is proven and trusted by physicians to detect and diagnose irregular heart rhythms -- and has been prescribed to hundreds of thousands of patients.  The FDA has cleared the Zio system to monitor for many different kinds of irregular heart rhythms. In a study, physicians were able to reach a diagnosis 90% of the time with the Zio system1.  You can wear the Zio monitor -- a small, discreet, comfortable patch -- during your normal day-to-day activity, including while you sleep, shower, and exercise, while it records every single heartbeat for analysis.  1Barrett, P., et al. Comparison of 24 Hour Holter Monitoring Versus 14 Day Novel Adhesive Patch Electrocardiographic Monitoring. American Journal of Medicine, 2014.  ZIO VS. HOLTER MONITORING The Zio monitor can be comfortably worn for up to 14 days. Holter monitors can be worn for 24 to 48 hours, limiting the time to record any irregular heart rhythms you may have. Zio is able to capture data for the 51% of patients who have their first symptom-triggered arrhythmia after 48 hours.1  LIVE WITHOUT RESTRICTIONS The Zio ambulatory cardiac monitor is a small, unobtrusive, and  water-resistant patch--you might even forget you're wearing it. The Zio monitor records and stores every beat of your heart, whether you're sleeping, working out, or showering.  Wear the monitor for 1 week, remove on 05/01/20.   Follow-Up: At [facility], you and your health needs are our priority.  As part of our continuing mission to provide you with exceptional heart care, we have created designated Provider Care Teams.  These Care Teams include your primary Cardiologist (physician) and Advanced Practice Providers (APPs -  Physician Assistants and Nurse Practitioners) who all work together to provide you with the care you need, when you need it.  We recommend signing up for the patient portal called "MyChart".  Sign up information is provided on this After Visit Summary.  MyChart is used to connect with patients for Virtual Visits (Telemedicine).  Patients are able to view lab/test results, encounter notes, upcoming appointments, etc.  Non-urgent messages can be sent to your provider as well.   To learn more about what you can do with MyChart, go to [email or web address].    Your next appointment:   1 month(s)  The format for your next appointment:   In Person  Provider:   Gypsy Balsam, MD   Other Instructions Rivaroxaban oral tablets What is this medicine? RIVAROXABAN (ri va ROX a ban) is an anticoagulant (blood thinner). It is used to treat blood clots in the lungs or in the veins. It is also used to prevent blood clots in the lungs or in the veins. It is also used to lower the  chance of stroke in people with a medical condition called atrial fibrillation. This medicine may be used for other purposes; ask your health care provider or pharmacist if you have questions. COMMON BRAND NAME(S): Xarelto, Xarelto Starter Pack What should I tell my health care provider before I take this medicine? They need to know if you have any of these conditions:  antiphospholipid antibody  syndrome  artificial heart valve  bleeding disorders  bleeding in the brain  blood in your stools (black or tarry stools) or if you have blood in your vomit  history of blood clots  history of stomach bleeding  kidney disease  liver disease  low blood counts, like low white cell, platelet, or red cell counts  recent or planned spinal or epidural procedure  take medicines that treat or prevent blood clots  an unusual or allergic reaction to rivaroxaban, other medicines, foods, dyes, or preservatives  pregnant or trying to get pregnant  breast-feeding How should I use this medicine? Take this medicine by mouth with a glass of water. Follow the directions on the prescription label. Take your medicine at regular intervals. Do not take it more often than directed. Do not stop taking except on your doctor's advice. Stopping this medicine may increase your risk of a blood clot. Be sure to refill your prescription before you run out of medicine. If you are taking this medicine after hip or knee replacement surgery, take it with or without food. If you are taking this medicine for atrial fibrillation, take it with your evening meal. If you are taking this medicine to treat blood clots, take it with food at the same time each day. If you are unable to swallow your tablet, you may crush the tablet and mix it in applesauce. Then, immediately eat the applesauce. You should eat more food right after you eat the applesauce containing the crushed tablet. Talk to your pediatrician regarding the use of this medicine in children. Special care may be needed. Overdosage: If you think you have taken too much of this medicine contact a poison control center or emergency room at once. NOTE: This medicine is only for you. Do not share this medicine with others. What if I miss a dose? If you take your medicine once a day and miss a dose, take the missed dose as soon as you remember. If it is almost time  for your next dose, take only that dose. Do not take double or extra doses. If you take your medicine twice a day and miss a dose, take the missed dose immediately. In this instance, 2 tablets may be taken at the same time. The next day you should take 1 tablet twice a day as directed. What may interact with this medicine? Do not take this medicine with any of the following medications:  defibrotide This medicine may also interact with the following medications:  aspirin and aspirin-like medicines  certain antibiotics like erythromycin, azithromycin, and clarithromycin  certain medicines for fungal infections like ketoconazole and itraconazole  certain medicines for irregular heart beat like amiodarone, quinidine, dronedarone  certain medicines for seizures like carbamazepine, phenytoin  certain medicines that treat or prevent blood clots like warfarin, enoxaparin, and dalteparin  conivaptan  felodipine  indinavir  lopinavir; ritonavir  NSAIDS, medicines for pain and inflammation, like ibuprofen or naproxen  ranolazine  rifampin  ritonavir  SNRIs, medicines for depression, like desvenlafaxine, duloxetine, levomilnacipran, venlafaxine  SSRIs, medicines for depression, like citalopram, escitalopram, fluoxetine, fluvoxamine, paroxetine, sertraline  St. John's wort  verapamil This list may not describe all possible interactions. Give your health care provider a list of all the medicines, herbs, non-prescription drugs, or dietary supplements you use. Also tell them if you smoke, drink alcohol, or use illegal drugs. Some items may interact with your medicine. What should I watch for while using this medicine? Visit your healthcare professional for regular checks on your progress. You may need blood work done while you are taking this medicine. Your condition will be monitored carefully while you are receiving this medicine. It is important not to miss any appointments. Avoid  sports and activities that might cause injury while you are using this medicine. Severe falls or injuries can cause unseen bleeding. Be careful when using sharp tools or knives. Consider using an Neurosurgeon. Take special care brushing or flossing your teeth. Report any injuries, bruising, or red spots on the skin to your healthcare professional. If you are going to need surgery or other procedure, tell your healthcare professional that you are taking this medicine. Wear a medical ID bracelet or chain. Carry a card that describes your disease and details of your medicine and dosage times. What side effects may I notice from receiving this medicine? Side effects that you should report to your doctor or health care professional as soon as possible:  allergic reactions like skin rash, itching or hives, swelling of the face, lips, or tongue  back pain  redness, blistering, peeling or loosening of the skin, including inside the mouth  signs and symptoms of bleeding such as bloody or black, tarry stools; red or dark-brown urine; spitting up blood or brown material that looks like coffee grounds; red spots on the skin; unusual bruising or bleeding from the eye, gums, or nose  signs and symptoms of a blood clot such as chest pain; shortness of breath; pain, swelling, or warmth in the leg  signs and symptoms of a stroke such as changes in vision; confusion; trouble speaking or understanding; severe headaches; sudden numbness or weakness of the face, arm or leg; trouble walking; dizziness; loss of coordination Side effects that usually do not require medical attention (report to your doctor or health care professional if they continue or are bothersome):  dizziness  muscle pain This list may not describe all possible side effects. Call your doctor for medical advice about side effects. You may report side effects to FDA at 1-800-FDA-1088. Where should I keep my medicine? Keep out of the reach of  children. Store at room temperature between 15 and 30 degrees C (59 and 86 degrees F). Throw away any unused medicine after the expiration date. NOTE: This sheet is a summary. It may not cover all possible information. If you have questions about this medicine, talk to your doctor, pharmacist, or health care provider.  2020 Elsevier/Gold Standard (2018-12-21 09:45:59)

## 2020-04-25 LAB — BASIC METABOLIC PANEL
BUN/Creatinine Ratio: 14 (ref 9–20)
BUN: 16 mg/dL (ref 6–24)
CO2: 24 mmol/L (ref 20–29)
Calcium: 9.6 mg/dL (ref 8.7–10.2)
Chloride: 102 mmol/L (ref 96–106)
Creatinine, Ser: 1.13 mg/dL (ref 0.76–1.27)
GFR calc Af Amer: 89 mL/min/{1.73_m2} (ref >59)
GFR calc non Af Amer: 77 mL/min/{1.73_m2} (ref >59)
Glucose: 117 mg/dL — ABNORMAL HIGH (ref 65–99)
Potassium: 4.8 mmol/L (ref 3.5–5.2)
Sodium: 139 mmol/L (ref 134–144)

## 2020-04-25 LAB — CBC WITH DIFFERENTIAL/PLATELET
Basophils Absolute: 0 10*3/uL (ref 0.0–0.2)
Basos: 1 %
EOS (ABSOLUTE): 0.2 10*3/uL (ref 0.0–0.4)
Eos: 2 %
Hematocrit: 42.2 % (ref 37.5–51.0)
Hemoglobin: 14.3 g/dL (ref 13.0–17.7)
Immature Grans (Abs): 0 10*3/uL (ref 0.0–0.1)
Immature Granulocytes: 0 %
Lymphocytes Absolute: 1.6 10*3/uL (ref 0.7–3.1)
Lymphs: 18 %
MCH: 30.6 pg (ref 26.6–33.0)
MCHC: 33.9 g/dL (ref 31.5–35.7)
MCV: 90 fL (ref 79–97)
Monocytes Absolute: 0.6 10*3/uL (ref 0.1–0.9)
Monocytes: 6 %
Neutrophils Absolute: 6.5 10*3/uL (ref 1.4–7.0)
Neutrophils: 73 %
Platelets: 182 10*3/uL (ref 150–450)
RBC: 4.68 x10E6/uL (ref 4.14–5.80)
RDW: 11.8 % (ref 11.6–15.4)
WBC: 8.8 10*3/uL (ref 3.4–10.8)

## 2020-04-27 NOTE — Addendum Note (Signed)
Addended by: Eleonore Chiquito on: 04/27/2020 05:22 PM   Modules accepted: Orders

## 2020-05-12 LAB — HM COLONOSCOPY

## 2020-05-14 LAB — FECAL OCCULT BLOOD, IMMUNOCHEMICAL: Fecal Occult Bld: NEGATIVE

## 2020-05-23 ENCOUNTER — Telehealth: Payer: Self-pay

## 2020-05-23 NOTE — Telephone Encounter (Signed)
Spoke with patient regarding results and recommendation.  Patient verbalizes understanding and is agreeable to plan of care. Advised patient to call back with any issues or concerns.  

## 2020-05-23 NOTE — Telephone Encounter (Signed)
-----   Message from Georgeanna Lea, MD sent at 05/22/2020  1:58 PM EDT ----- Low burden of atrial fibrillation only 1%.  We will talk about this during the visit

## 2020-05-31 ENCOUNTER — Other Ambulatory Visit: Payer: Self-pay | Admitting: Internal Medicine

## 2020-05-31 ENCOUNTER — Ambulatory Visit: Payer: 59 | Admitting: Cardiology

## 2020-05-31 ENCOUNTER — Telehealth: Payer: Self-pay | Admitting: *Deleted

## 2020-05-31 ENCOUNTER — Other Ambulatory Visit: Payer: Self-pay

## 2020-05-31 ENCOUNTER — Telehealth: Payer: Self-pay | Admitting: Internal Medicine

## 2020-05-31 ENCOUNTER — Encounter: Payer: Self-pay | Admitting: Cardiology

## 2020-05-31 VITALS — BP 142/84 | HR 68 | Ht 71.0 in | Wt 202.0 lb

## 2020-05-31 DIAGNOSIS — E119 Type 2 diabetes mellitus without complications: Secondary | ICD-10-CM

## 2020-05-31 DIAGNOSIS — I10 Essential (primary) hypertension: Secondary | ICD-10-CM | POA: Diagnosis not present

## 2020-05-31 DIAGNOSIS — R0683 Snoring: Secondary | ICD-10-CM

## 2020-05-31 DIAGNOSIS — R079 Chest pain, unspecified: Secondary | ICD-10-CM

## 2020-05-31 DIAGNOSIS — I480 Paroxysmal atrial fibrillation: Secondary | ICD-10-CM | POA: Diagnosis not present

## 2020-05-31 DIAGNOSIS — G4700 Insomnia, unspecified: Secondary | ICD-10-CM

## 2020-05-31 DIAGNOSIS — E785 Hyperlipidemia, unspecified: Secondary | ICD-10-CM

## 2020-05-31 NOTE — Telephone Encounter (Signed)
Alprazolam refill.   Last OV: 02/22/20, no future appt Last Fill: 02/22/2020 #30 and 1RF Pt sig: 1 tab qhs prn UDS: None

## 2020-05-31 NOTE — Progress Notes (Signed)
Cardiology Office Note:    Date:  05/31/2020   ID:  Bradley Jefferson, DOB 1971-06-04, MRN [id number]  PCP:  Bradley Plump, MD  Cardiologist:  Bradley Balsam, MD    Referring MD: Bradley Plump, MD   No chief complaint on file. Am still having palpitations  History of Present Illness:    Bradley Jefferson is a 49 y.o. male with past medical history significant for hypertension, diabetes, paroxysmal atrial fibrillation.  He did wear a Holter monitor which showed total burden of atrial fibrillation 1%.  He said he feels this typically when he lays down at evening time.  Denies of any chest pain tightness squeezing pressure burning chest no dizziness when he developed the arrhythmia.  Overall he is doing well  Past Medical History:  Diagnosis Date  . Annual physical exam 07/22/2011  . Diabetes mellitus 03/2009  . DM II (diabetes mellitus, type II), controlled (HCC) 03/07/2009   Patient requested to discontinue Actos and metformin and switch to Byetta 08-2010 Metformin restarted 12-2010 but self discontinued due to GI side effects Feet exam normal 11-15 Eye care discussed 10-14   . ETD (eustachian tube dysfunction) 01/21/2012  . EXTERNAL HEMORRHOIDS 10/12/2010   Qualifier: Diagnosis of  By: Drue Novel MD, Nolon Rod.   . GERD 03/07/2009   Qualifier: Diagnosis of  By: Shary Decamp    . GERD (gastroesophageal reflux disease)   . Hypertension 07/05/2018  . Low back pain    sees Chiro-herman    Past Surgical History:  Procedure Laterality Date  . abscess anal gland     s/p I&D    Current Medications: No outpatient medications have been marked as taking for the 05/31/20 encounter (Appointment) with Bradley Lea, MD.     Allergies:   Patient has no known allergies.   Social History   Socioeconomic History  . Marital status: Married    Spouse name: Not on file  . Number of children: 0  . Years of education: Not on file  . Highest education level: Not on file  Occupational History  . Occupation:  manage a car parts department    Employer: [employer]  Tobacco Use  . Smoking status: Former Smoker    Quit date: 07/22/1999    Years since quitting: 20.8  . Smokeless tobacco: Never Used  Substance and Sexual Activity  . Alcohol use: Yes    Comment: socially   . Drug use: No  . Sexual activity: Not on file  Other Topics Concern  . Not on file  Social History Narrative   Lives w/ wife, she has h/o breast ca, also dx w/ H. Lymphoma ~ 03-2016, s/p chemo    Social Determinants of Health   Financial Resource Strain:   . Difficulty of Paying Living Expenses: Not on file  Food Insecurity:   . Worried About Programme researcher, broadcasting/film/video in the Last Year: Not on file  . Ran Out of Food in the Last Year: Not on file  Transportation Needs:   . Lack of Transportation (Medical): Not on file  . Lack of Transportation (Non-Medical): Not on file  Physical Activity:   . Days of Exercise per Week: Not on file  . Minutes of Exercise per Session: Not on file  Stress:   . Feeling of Stress : Not on file  Social Connections:   . Frequency of Communication with Friends and Family: Not on file  . Frequency of Social Gatherings with Friends and  Family: Not on file  . Attends Religious Services: Not on file  . Active Member of Clubs or Organizations: Not on file  . Attends Banker Meetings: Not on file  . Marital Status: Not on file     Family History: The patient's family history includes CAD (age of onset: 17) in his father; Diabetes in his father; Hyperlipidemia in his father; Stroke (age of onset: 75) in his mother. There is no history of Colon cancer, Colon polyps, or Prostate cancer. ROS:   Please see the history of present illness.    All 14 point review of systems negative except as described per history of present illness  EKGs/Labs/Other Studies Reviewed:      Recent Labs: 06/08/2019: ALT 23 12/03/2019: TSH 1.88 04/24/2020: BUN 16; Creatinine, Ser 1.13; Hemoglobin 14.3;  Platelets 182; Potassium 4.8; Sodium 139  Recent Lipid Panel    Component Value Date/Time   CHOL 126 02/22/2020 0939   TRIG 166.0 (H) 02/22/2020 0939   HDL 32.60 (L) 02/22/2020 0939   CHOLHDL 4 02/22/2020 0939   VLDL 33.2 02/22/2020 0939   LDLCALC 60 02/22/2020 0939   LDLCALC 119 (H) 12/03/2019 1607   LDLDIRECT 106.0 09/29/2018 0909    Physical Exam:    VS:  There were no vitals taken for this visit.    Wt Readings from Last 3 Encounters:  04/24/20 205 lb 3.2 oz (93.1 kg)  02/22/20 201 lb 2 oz (91.2 kg)  02/14/20 203 lb (92.1 kg)     GEN:  Well nourished, well developed in no acute distress HEENT: Normal NECK: No JVD; No carotid bruits LYMPHATICS: No lymphadenopathy CARDIAC: RRR, no murmurs, no rubs, no gallops RESPIRATORY:  Clear to auscultation without rales, wheezing or rhonchi  ABDOMEN: Soft, non-tender, non-distended MUSCULOSKELETAL:  No edema; No deformity  SKIN: Warm and dry LOWER EXTREMITIES: no swelling NEUROLOGIC:  Alert and oriented x 3 PSYCHIATRIC:  Normal affect   ASSESSMENT:    1. Paroxysmal atrial fibrillation (HCC)   2. Essential hypertension   3. Controlled type 2 diabetes mellitus without complication, without long-term current use of insulin (HCC)   4. Insomnia, unspecified type   5. Dyslipidemia    PLAN:    In order of problems listed above:  1. Paroxysmal atrial fibrillation his chads 2 vascular equals 2.  He is anticoagulated which I will continue.  We will talk about potential explanation for his palpitations.  Will schedule him to have a sleep study.  We also talked about potentially starting antiarrhythmic therapy.  I will schedule him to have Lexiscan to rule out ischemia and if he does not have any ischemia then flecainide will be initiated. 2. Essential hypertension blood pressure slightly elevated today we will continue monitoring. 3. Diabetes.  Followed by internal medicine team. 4. Insomnia: Sleep study will be beneficial for him to  figure out what the problem is. 5. Dyslipidemia: I do not see on any cholesterol medication, he is diabetic, he need to be at least on moderate intensity statin   Medication Adjustments/Labs and Tests Ordered: Current medicines are reviewed at length with the patient today.  Concerns regarding medicines are outlined above.  No orders of the defined types were placed in this encounter.  Medication changes: No orders of the defined types were placed in this encounter.   Signed, Bradley Lea, MD, [facility] 05/31/2020 9:04 AM    [facility]

## 2020-05-31 NOTE — Patient Instructions (Signed)
Medication Instructions:  Your physician recommends that you continue on your current medications as directed. Please refer to the Current Medication list given to you today.  *If you need a refill on your cardiac medications before your next appointment, please call your pharmacy*   Lab Work: Your physician recommends that you return for lab work today: lipid   If you have labs (blood work) drawn today and your tests are completely normal, you will receive your results only by: Marland Kitchen. MyChart Message (if you have MyChart) OR . A paper copy in the mail If you have any lab test that is abnormal or we need to change your treatment, we will call you to review the results.   Testing/Procedures:     [facility] [address], [address] [place], [place] [address] Phone: [phone]    Please arrive 15 minutes prior to your appointment time for registration and insurance purposes.  The test will take approximately 3 to 4 hours to complete; you may bring reading material.  If someone comes with you to your appointment, they will need to remain in the main lobby due to limited space in the testing area. **If you are pregnant or breastfeeding, please notify the nuclear lab prior to your appointment**  How to prepare for your Myocardial Perfusion Test: . Do not eat or drink 3 hours prior to your test, except you may have water. . Do not consume products containing caffeine (regular or decaffeinated) 12 hours prior to your test. (ex: coffee, chocolate, sodas, tea). . Do bring a list of your current medications with you.  If not listed below, you may take your medications as normal. .  HOLD diabetic medication/insulin the morning of the test: glipizide, and tragenta.  . Do wear comfortable clothes (no dresses or overalls) and walking shoes, tennis shoes preferred (No heels or open toe shoes are allowed). . Do NOT wear cologne, perfume, aftershave, or  lotions (deodorant is allowed). . If these instructions are not followed, your test will have to be rescheduled.  Please report to [address], [address] for your test.  If you have questions or concerns about your appointment, you can call the Nuclear Lab at [phone].  If you cannot keep your appointment, please provide 24 hours notification to the Nuclear Lab, to avoid a possible $50 charge to your account.   If you cannot keep your appointment, please provide 24 hours notification to the Nuclear Lab, to avoid a possible $50 charge to your account.   Your physician has recommended that you have a sleep study. This test records several body functions during sleep, including: brain activity, eye movement, oxygen and carbon dioxide blood levels, heart rate and rhythm, breathing rate and rhythm, the flow of air through your mouth and nose, snoring, body muscle movements, and chest and belly movement.   Follow-Up: At [facility], you and your health needs are our priority.  As part of our continuing mission to provide you with exceptional heart care, we have created designated Provider Care Teams.  These Care Teams include your primary Cardiologist (physician) and Advanced Practice Providers (APPs -  Physician Assistants and Nurse Practitioners) who all work together to provide you with the care you need, when you need it.  We recommend signing up for the patient portal called "MyChart".  Sign up information is provided on this After Visit Summary.  MyChart is used to connect with patients for Virtual Visits (Telemedicine).  Patients are able to view lab/test results, encounter notes, upcoming appointments, etc.  Non-urgent messages can be sent to your provider as well.   To learn more about what you can do with MyChart, go to [email or web address].    Your next appointment:   3 month(s)  The format for your next appointment:   In Person  Provider:   Gypsy Balsam,  MD   Other Instructions    Cardiac Nuclear Scan A cardiac nuclear scan is a test that measures blood flow to the heart when a person is resting and when he or she is exercising. The test looks for problems such as:  Not enough blood reaching a portion of the heart.  The heart muscle not working normally. You may need this test if:  You have heart disease.  You have had abnormal lab results.  You have had heart surgery or a balloon procedure to open up blocked arteries (angioplasty).  You have chest pain.  You have shortness of breath. In this test, a radioactive dye (tracer) is injected into your bloodstream. After the tracer has traveled to your heart, an imaging device is used to measure how much of the tracer is absorbed by or distributed to various areas of your heart. This procedure is usually done at a hospital and takes 2-4 hours. Tell a health care provider about:  Any allergies you have.  All medicines you are taking, including vitamins, herbs, eye drops, creams, and over-the-counter medicines.  Any problems you or family members have had with anesthetic medicines.  Any blood disorders you have.  Any surgeries you have had.  Any medical conditions you have.  Whether you are pregnant or may be pregnant. What are the risks? Generally, this is a safe procedure. However, problems may occur, including:  Serious chest pain and heart attack. This is only a risk if the stress portion of the test is done.  Rapid heartbeat.  Sensation of warmth in your chest. This usually passes quickly.  Allergic reaction to the tracer. What happens before the procedure?  Ask your health care provider about changing or stopping your regular medicines. This is especially important if you are taking diabetes medicines or blood thinners.  Follow instructions from your health care provider about eating or drinking restrictions.  Remove your jewelry on the day of the  procedure. What happens during the procedure?  An IV will be inserted into one of your veins.  Your health care provider will inject a small amount of radioactive tracer through the IV.  You will wait for 20-40 minutes while the tracer travels through your bloodstream.  Your heart activity will be monitored with an electrocardiogram (ECG).  You will lie down on an exam table.  Images of your heart will be taken for about 15-20 minutes.  You may also have a stress test. For this test, one of the following may be done: ? You will exercise on a treadmill or stationary bike. While you exercise, your heart's activity will be monitored with an ECG, and your blood pressure will be checked. ? You will be given medicines that will increase blood flow to parts of your heart. This is done if you are unable to exercise.  When blood flow to your heart has peaked, a tracer will again be injected through the IV.  After 20-40 minutes, you will get back on the exam table and have more images taken of your heart.  Depending on the type of tracer used, scans  may need to be repeated 3-4 hours later.  Your IV line will be removed when the procedure is over. The procedure may vary among health care providers and hospitals. What happens after the procedure?  Unless your health care provider tells you otherwise, you may return to your normal schedule, including diet, activities, and medicines.  Unless your health care provider tells you otherwise, you may increase your fluid intake. This will help to flush the contrast dye from your body. Drink enough fluid to keep your urine pale yellow.  Ask your health care provider, or the department that is doing the test: ? When will my results be ready? ? How will I get my results? Summary  A cardiac nuclear scan measures the blood flow to the heart when a person is resting and when he or she is exercising.  Tell your health care provider if you are  pregnant.  Before the procedure, ask your health care provider about changing or stopping your regular medicines. This is especially important if you are taking diabetes medicines or blood thinners.  After the procedure, unless your health care provider tells you otherwise, increase your fluid intake. This will help flush the contrast dye from your body.  After the procedure, unless your health care provider tells you otherwise, you may return to your normal schedule, including diet, activities, and medicines. This information is not intended to replace advice given to you by your health care provider. Make sure you discuss any questions you have with your health care provider. Document Revised: 03/09/2018 Document Reviewed: 03/09/2018 Elsevier Patient Education  2020 Elsevier Inc.   Sleep Studies A sleep study (polysomnogram) is a series of tests done while you are sleeping. A sleep study records your brain waves, heart rate, breathing rate, oxygen level, and eye and leg movements. A sleep study helps your health care provider: See how well you sleep. Diagnose a sleep disorder. Determine how severe your sleep disorder is. Create a plan to treat your sleep disorder. Your health care provider may recommend a sleep study if you: Feel sleepy on most days. Snore loudly while sleeping. Have unusual behaviors while you sleep, such as walking. Have brief periods in which you stop breathing during sleep (sleepapnea). Fall asleep suddenly during the day (narcolepsy). Have trouble falling asleep or staying asleep (insomnia). Feel like you need to move your legs when trying to fall asleep (restless legs syndrome). Move your legs by flexing and extending them regularly while asleep (periodic limb movement disorder). Act out your dreams while you sleep (sleep behavior disorder). Feel like you cannot move when you first wake up (sleep paralysis). What tests are part of a sleep study? Most sleep  studies record the following during sleep: Brain activity. Eye movements. Heart rate and rhythm. Breathing rate and rhythm. Blood-oxygen level. Blood pressure. Chest and belly movement as you breathe. Arm and leg movements. Snoring or other noises. Body position. Where are sleep studies done? Sleep studies are done at sleep centers. A sleep center may be inside a hospital, office, or clinic. The room where you have the study may look like a hospital room or a hotel room. The health care providers doing the study may come in and out of the room during the study. Most of the time, they will be in another room monitoring your test as you sleep. How are sleep studies done? Most sleep studies are done during a normal period of time for a full night of sleep. You will  arrive at the study center in the evening and go home in the morning. Before the test Bring your pajamas and toothbrush with you to the sleep study. Do not have caffeine on the day of your sleep study. Do not drink alcohol on the day of your sleep study. Your health care provider will let you know if you should stop taking any of your regular medicines before the test. During the test     Round, sticky patches with sensors attached to recording wires (electrodes) are placed on your scalp, face, chest, and limbs. Wires from all the electrodes and sensors run from your bed to a computer. The wires can be taken off and put back on if you need to get out of bed to go to the bathroom. A sensor is placed over your nose to measure airflow. A finger clip is put on your finger or ear to measure your blood oxygen level (pulse oximetry). A belt is placed around your belly and a belt is placed around your chest to measure breathing movements. If you have signs of the sleep disorder called sleep apnea during your test, you may get a treatment mask to wear for the second half of the night. The mask provides positive airway pressure (PAP) to  help you breathe better during sleep. This may greatly improve your sleep apnea. You will then have all tests done again with the mask in place to see if your measurements and recordings change. After the test A medical doctor who specializes in sleep will evaluate the results of your sleep study and share them with you and your primary health care provider. Based on your results, your medical history, and a physical exam, you may be diagnosed with a sleep disorder, such as: Sleep apnea. Restless legs syndrome. Sleep-related behavior disorder. Sleep-related movement disorders. Sleep-related seizure disorders. Your health care team will help determine your treatment options based on your diagnosis. This may include: Improving your sleep habits (sleep hygiene). Wearing a continuous positive airway pressure (CPAP) or bi-level positive airway pressure (BPAP) mask. Wearing an oral device at night to improve breathing and reduce snoring. Taking medicines. Follow these instructions at home: Take over-the-counter and prescription medicines only as told by your health care provider. If you are instructed to use a CPAP or BPAP mask, make sure you use it nightly as directed. Make any lifestyle changes that your health care provider recommends. If you were given a device to open your airway while you sleep, use it only as told by your health care provider. Do not use any tobacco products, such as cigarettes, chewing tobacco, and e-cigarettes. If you need help quitting, ask your health care provider. Keep all follow-up visits as told by your health care provider. This is important. Summary A sleep study (polysomnogram) is a series of tests done while you are sleeping. It shows how well you sleep. Most sleep studies are done over one full night of sleep. You will arrive at the study center in the evening and go home in the morning. If you have signs of the sleep disorder called sleep apnea during your  test, you may get a treatment mask to wear for the second half of the night. A medical doctor who specializes in sleep will evaluate the results of your sleep study and share them with your primary health care provider. This information is not intended to replace advice given to you by your health care provider. Make sure you discuss any questions you  have with your health care provider. Document Revised: 03/10/2019 Document Reviewed: 10/21/2017 Elsevier Patient Education  2020 [employer].

## 2020-05-31 NOTE — Telephone Encounter (Signed)
Notified  Dr Fritzi Mandes and Mayme Genta B. Patient does not meet [employer] criteria for in lab sleep study.asked if ok to do HST. Per Dr Fritzi Mandes ok to do HST. His nurse , Mayme Genta was notified. She will order and schedule HST for patient.

## 2020-05-31 NOTE — Telephone Encounter (Signed)
Prescription sent, PDMP okay 

## 2020-06-08 ENCOUNTER — Encounter (HOSPITAL_COMMUNITY): Payer: Self-pay | Admitting: Cardiology

## 2020-06-19 ENCOUNTER — Telehealth (HOSPITAL_COMMUNITY): Payer: Self-pay | Admitting: Cardiology

## 2020-06-19 NOTE — Telephone Encounter (Signed)
Just an FYI. We have made several attempts to contact this patient including sending a letter to schedule or reschedule their Myoview. We will be removing the patient from the Nuclear  WQ.    06/08/20 MAILED LETTER [facility]  06/05/20 [facility] to schedule @ 8:15/LBW  06/01/20 [facility] to schedule @ 10:54/LBW  06/02/20 5:05pm Left Message to schedule - DT     Thank you

## 2020-06-28 ENCOUNTER — Other Ambulatory Visit: Payer: Self-pay | Admitting: Internal Medicine

## 2020-07-06 ENCOUNTER — Telehealth (HOSPITAL_COMMUNITY): Payer: Self-pay | Admitting: *Deleted

## 2020-07-06 NOTE — Telephone Encounter (Signed)
Patient given detailed instructions per Myocardial Perfusion Study Information Sheet for the test on 07/11/20. Patient notified to arrive 15 minutes early and that it is imperative to arrive on time for appointment to keep from having the test rescheduled.  If you need to cancel or reschedule your appointment, please call the office within 24 hours of your appointment. . Patient verbalized understanding. Bradley Jefferson    

## 2020-07-10 ENCOUNTER — Telehealth (HOSPITAL_COMMUNITY): Payer: Self-pay

## 2020-07-11 ENCOUNTER — Other Ambulatory Visit: Payer: Self-pay

## 2020-07-11 ENCOUNTER — Ambulatory Visit (HOSPITAL_COMMUNITY): Payer: 59 | Attending: Cardiology

## 2020-07-11 DIAGNOSIS — I480 Paroxysmal atrial fibrillation: Secondary | ICD-10-CM | POA: Insufficient documentation

## 2020-07-11 DIAGNOSIS — R079 Chest pain, unspecified: Secondary | ICD-10-CM | POA: Insufficient documentation

## 2020-07-11 LAB — MYOCARDIAL PERFUSION IMAGING
LV dias vol: 107 mL (ref 62–150)
LV sys vol: 43 mL
Peak HR: 95 {beats}/min
Rest HR: 60 {beats}/min
SDS: 3
SRS: 0
SSS: 3
TID: 1.02

## 2020-07-11 MED ORDER — TECHNETIUM TC 99M TETROFOSMIN IV KIT
31.6000 | PACK | Freq: Once | INTRAVENOUS | Status: AC | PRN
  Administered 2020-07-11: 31.6 via INTRAVENOUS
  Filled 2020-07-11: qty 32

## 2020-07-11 MED ORDER — TECHNETIUM TC 99M TETROFOSMIN IV KIT
10.3000 | PACK | Freq: Once | INTRAVENOUS | Status: AC | PRN
  Administered 2020-07-11: 10.3 via INTRAVENOUS
  Filled 2020-07-11: qty 11

## 2020-07-11 MED ORDER — REGADENOSON 0.4 MG/5ML IV SOLN
0.4000 mg | Freq: Once | INTRAVENOUS | Status: AC
  Administered 2020-07-11: 0.4 mg via INTRAVENOUS

## 2020-07-17 ENCOUNTER — Telehealth: Payer: Self-pay | Admitting: Emergency Medicine

## 2020-07-17 NOTE — Telephone Encounter (Signed)
Called patient to inform him of stress test results. During the call he asked if Dr. Bing Matter was still going to give him medication for afib? I advised him Dr. Bing Matter was off this week but I would forward to him and when he returns will let him know. No further questions.

## 2020-07-17 NOTE — Telephone Encounter (Signed)
-----   Message from Georgeanna Lea, MD sent at 07/15/2020 10:52 AM EDT ----- Stress test is negative

## 2020-07-18 NOTE — Telephone Encounter (Signed)
He has been talking about potentially starting flecainide, and that is why he did have a stress test, likely stress test is normal.  We need to bring him back to the office next week for EKG and based on that we start him on flecainide.

## 2020-07-19 NOTE — Telephone Encounter (Signed)
Called patient informed him that Dr. Bing Matter will need a ekg appointment before we can start flecainide patient scheduled.

## 2020-07-21 ENCOUNTER — Other Ambulatory Visit: Payer: Self-pay | Admitting: Internal Medicine

## 2020-07-21 MED ORDER — PANTOPRAZOLE SODIUM 40 MG PO TBEC: 40.0000 mg | DELAYED_RELEASE_TABLET | Freq: Every day | ORAL | 0 refills | Status: DC

## 2020-07-21 NOTE — Addendum Note (Signed)
Addended byConrad [place] D on: 07/21/2020 10:11 AM   Modules accepted: Orders

## 2020-07-21 NOTE — Telephone Encounter (Signed)
E-scribe down. Rx faxed to [facility] in [place]. Fax confirmation received.  

## 2020-07-23 ENCOUNTER — Other Ambulatory Visit: Payer: Self-pay | Admitting: Internal Medicine

## 2020-07-27 DIAGNOSIS — K219 Gastro-esophageal reflux disease without esophagitis: Secondary | ICD-10-CM | POA: Insufficient documentation

## 2020-07-27 DIAGNOSIS — M545 Low back pain, unspecified: Secondary | ICD-10-CM | POA: Insufficient documentation

## 2020-08-01 ENCOUNTER — Encounter: Payer: Self-pay | Admitting: Cardiology

## 2020-08-01 ENCOUNTER — Ambulatory Visit: Payer: 59 | Admitting: Cardiology

## 2020-08-01 ENCOUNTER — Other Ambulatory Visit: Payer: Self-pay

## 2020-08-01 VITALS — BP 120/88 | HR 64 | Ht 71.0 in | Wt 208.0 lb

## 2020-08-01 DIAGNOSIS — E785 Hyperlipidemia, unspecified: Secondary | ICD-10-CM

## 2020-08-01 DIAGNOSIS — E119 Type 2 diabetes mellitus without complications: Secondary | ICD-10-CM | POA: Diagnosis not present

## 2020-08-01 DIAGNOSIS — I480 Paroxysmal atrial fibrillation: Secondary | ICD-10-CM | POA: Diagnosis not present

## 2020-08-01 DIAGNOSIS — R002 Palpitations: Secondary | ICD-10-CM

## 2020-08-01 DIAGNOSIS — I10 Essential (primary) hypertension: Secondary | ICD-10-CM

## 2020-08-01 MED ORDER — FLECAINIDE ACETATE 50 MG PO TABS
50.0000 mg | ORAL_TABLET | Freq: Two times a day (BID) | ORAL | 1 refills | Status: DC
Start: 2020-08-01 — End: 2021-01-11

## 2020-08-01 MED ORDER — DILTIAZEM HCL ER COATED BEADS 120 MG PO CP24
120.0000 mg | ORAL_CAPSULE | Freq: Every day | ORAL | 1 refills | Status: DC
Start: 2020-08-01 — End: 2021-01-11

## 2020-08-01 NOTE — Progress Notes (Signed)
Cardiology Office Note:    Date:  08/01/2020   ID:  Bradley Jefferson, DOB 04-23-1971, MRN [id number]  PCP:  Wanda Plump, MD  Cardiologist:  Gypsy Balsam, MD    Referring MD: Wanda Plump, MD   No chief complaint on file. M doing well  History of Present Illness:    Bradley Jefferson is a 49 y.o. male paroxysmal atrial fibrillation, CHA2DS2-VASc equals 2.  His anticoagulation which I will continue.  He came today to have EKG done to check QT interval as well as QRS complex duration morphology with intention to start flecainide therapy.  He is being doing well otherwise.  Still described to have some palpitations.  Echocardiogram showed preserved ejection fraction, stress test showed no evidence of ischemia.  Past Medical History:  Diagnosis Date  . Annual physical exam 07/22/2011  . Diabetes mellitus 03/2009  . DM II (diabetes mellitus, type II), controlled (HCC) 03/07/2009   Patient requested to discontinue Actos and metformin and switch to Byetta 08-2010 Metformin restarted 12-2010 but self discontinued due to GI side effects Feet exam normal 11-15 Eye care discussed 10-14   . Dyslipidemia 12/27/2019  . Dyspnea on exertion 12/27/2019  . ETD (eustachian tube dysfunction) 01/21/2012  . EXTERNAL HEMORRHOIDS 10/12/2010   Qualifier: Diagnosis of  By: Drue Novel MD, Nolon Rod.   . GERD 03/07/2009   Qualifier: Diagnosis of  By: Shary Decamp    . GERD (gastroesophageal reflux disease)   . Hypertension 07/05/2018  . Insomnia 02/23/2020  . Low back pain    sees Chiro-herman  . Palpitations 12/27/2019  . Paroxysmal atrial fibrillation (HCC) 04/24/2020  . PCP NOTES >>>>> 08/15/2015    Past Surgical History:  Procedure Laterality Date  . abscess anal gland     s/p I&D    Current Medications: No outpatient medications have been marked as taking for the 08/01/20 encounter (Office Visit) with Georgeanna Lea, MD.     Allergies:   Patient has no known allergies.   Social History   Socioeconomic History    . Marital status: Married    Spouse name: Not on file  . Number of children: 0  . Years of education: Not on file  . Highest education level: Not on file  Occupational History  . Occupation: manage a car parts department    Employer: [employer]  Tobacco Use  . Smoking status: Former Smoker    Quit date: 07/22/1999    Years since quitting: 21.0  . Smokeless tobacco: Never Used  Substance and Sexual Activity  . Alcohol use: Yes    Comment: socially   . Drug use: No  . Sexual activity: Not on file  Other Topics Concern  . Not on file  Social History Narrative   Lives w/ wife, she has h/o breast ca, also dx w/ H. Lymphoma ~ 03-2016, s/p chemo    Social Determinants of Health   Financial Resource Strain:   . Difficulty of Paying Living Expenses: Not on file  Food Insecurity:   . Worried About Programme researcher, broadcasting/film/video in the Last Year: Not on file  . Ran Out of Food in the Last Year: Not on file  Transportation Needs:   . Lack of Transportation (Medical): Not on file  . Lack of Transportation (Non-Medical): Not on file  Physical Activity:   . Days of Exercise per Week: Not on file  . Minutes of Exercise per Session: Not on file  Stress:   .  Feeling of Stress : Not on file  Social Connections:   . Frequency of Communication with Friends and Family: Not on file  . Frequency of Social Gatherings with Friends and Family: Not on file  . Attends Religious Services: Not on file  . Active Member of Clubs or Organizations: Not on file  . Attends Banker Meetings: Not on file  . Marital Status: Not on file     Family History: The patient's family history includes CAD (age of onset: 35) in his father; Diabetes in his father; Hyperlipidemia in his father; Stroke (age of onset: 78) in his mother. There is no history of Colon cancer, Colon polyps, or Prostate cancer. ROS:   Please see the history of present illness.    All 14 point review of systems negative except as  described per history of present illness  EKGs/Labs/Other Studies Reviewed:      Recent Labs: 12/03/2019: TSH 1.88 04/24/2020: BUN 16; Creatinine, Ser 1.13; Hemoglobin 14.3; Platelets 182; Potassium 4.8; Sodium 139  Recent Lipid Panel    Component Value Date/Time   CHOL 126 02/22/2020 0939   TRIG 166.0 (H) 02/22/2020 0939   HDL 32.60 (L) 02/22/2020 0939   CHOLHDL 4 02/22/2020 0939   VLDL 33.2 02/22/2020 0939   LDLCALC 60 02/22/2020 0939   LDLCALC 119 (H) 12/03/2019 1607   LDLDIRECT 106.0 09/29/2018 0909    Physical Exam:    VS:  BP 120/88   Pulse 64   Ht 5\' 11"  (1.803 m)   Wt 208 lb (94.3 kg)   SpO2 99%   BMI 29.01 kg/m     Wt Readings from Last 3 Encounters:  08/01/20 208 lb (94.3 kg)  07/11/20 199 lb (90.3 kg)  05/31/20 202 lb (91.6 kg)     GEN:  Well nourished, well developed in no acute distress HEENT: Normal NECK: No JVD; No carotid bruits LYMPHATICS: No lymphadenopathy CARDIAC: RRR, no murmurs, no rubs, no gallops RESPIRATORY:  Clear to auscultation without rales, wheezing or rhonchi  ABDOMEN: Soft, non-tender, non-distended MUSCULOSKELETAL:  No edema; No deformity  SKIN: Warm and dry LOWER EXTREMITIES: no swelling NEUROLOGIC:  Alert and oriented x 3 PSYCHIATRIC:  Normal affect   ASSESSMENT:    1. Paroxysmal atrial fibrillation (HCC)   2. Primary hypertension   3. Controlled type 2 diabetes mellitus without complication, without long-term current use of insulin (HCC)   4. Palpitations   5. Dyslipidemia    PLAN:    In order of problems listed above:  1. Paroxysmal atrial fibrillation anticoagulated will continue will initiate AV blockade with Cardizem  120 CD, also will start flecainide 25 mg twice daily.  I advised him to start Cardizem right now and then flecainide on Saturday and then come to Wednesday either Monday or Tuesday to have EKG done on him about potential side effect which include dizziness or passing out if this happened he to stop medication  let Thursday know immediately. 2. Primary essential hypertension doing well from that point review blood pressure well controlled. 3. Diabetes very well controlled continue present management. 4. Palpitations will initiate flecainide. 5. Dyslipidemia: He is on diet only and his last fasting lipid profile I see is from Feb 22, 2020 with LDL of 60 HDL 32.  We will continue discussion about potentially using statin.   Medication Adjustments/Labs and Tests Ordered: Current medicines are reviewed at length with the patient today.  Concerns regarding medicines are outlined above.  No orders of the defined  types were placed in this encounter.  Medication changes: No orders of the defined types were placed in this encounter.   Signed, Georgeanna Lea, MD, [facility] 08/01/2020 10:05 AM    [facility]

## 2020-08-01 NOTE — Patient Instructions (Addendum)
Medication Instructions:  Your physician has recommended you make the following change in your medication:  START: Cardizem 120 mg daily  START on Saturday: Flecainide 50 mg twice daiyl   *If you need a refill on your cardiac medications before your next appointment, please call your pharmacy*   Lab Work: None If you have labs (blood work) drawn today and your tests are completely normal, you will receive your results only by: Bradley Jefferson. MyChart Message (if you have MyChart) OR . A paper copy in the mail If you have any lab test that is abnormal or we need to change your treatment, we will call you to review the results.   Testing/Procedures: None.   Follow-Up: At [facility], you and your health needs are our priority.  As part of our continuing mission to provide you with exceptional heart care, we have created designated Provider Care Teams.  These Care Teams include your primary Cardiologist (physician) and Advanced Practice Providers (APPs -  Physician Assistants and Nurse Practitioners) who all work together to provide you with the care you need, when you need it.  We recommend signing up for the patient portal called "MyChart".  Sign up information is provided on this After Visit Summary.  MyChart is used to connect with patients for Virtual Visits (Telemedicine).  Patients are able to view lab/test results, encounter notes, upcoming appointments, etc.  Non-urgent messages can be sent to your provider as well.   To learn more about what you can do with MyChart, go to [email or web address].    Your next appointment:   3 month(s)  The format for your next appointment:   In Person  Provider:   Gypsy Balsamobert Krasowski, MD   Other Instructions  Diltiazem Extended-Release Oral Capsules or Tablets What is this medicine? DILTIAZEM (dil TYE a zem) is a calcium channel blocker. It relaxes your blood vessels and decreases the amount of work the heart has to do. It treats high blood pressure  and/or prevents chest pain (also called angina). This medicine may be used for other purposes; ask your health care provider or pharmacist if you have questions. COMMON BRAND NAME(S): Cardizem CD, Cardizem LA, Cardizem SR, Cartia XT, Dilacor XR, Dilt-CD, Diltia XT, Diltzac, Matzim LA, Verna Czechaztia XT, TIADYLT ER, Tiamate, Tiazac What should I tell my health care provider before I take this medicine? They need to know if you have any of these conditions:  heart attack  heart disease  irregular heartbeat or rhythm  low blood pressure  an unusual or allergic reaction to diltiazem, other drugs, foods, dyes, or preservatives  pregnant or trying to get pregnant  breast-feeding How should I use this medicine? Take this drug by mouth. Take it as directed on the prescription label at the same time every day. Do not cut, crush or chew this drug. Swallow the capsules whole. You can take it with or without food. If it upsets your stomach, take it with food. Keep taking it unless your health care provider tells you to stop. Talk to your health care provider about the use of this drug in children. Special care may be needed. Overdosage: If you think you have taken too much of this medicine contact a poison control center or emergency room at once. NOTE: This medicine is only for you. Do not share this medicine with others. What if I miss a dose? If you miss a dose, take it as soon as you can. If it is almost time for your next dose, take  only that dose. Do not take double or extra doses. What may interact with this medicine? Do not take this medicine with any of the following medications:  cisapride  hawthorn  pimozide  ranolazine  red yeast rice This medicine may also interact with the following medications:  buspirone  carbamazepine  cimetidine  cyclosporine  digoxin  local anesthetics or general anesthetics  lovastatin  medicines for anxiety or difficulty sleeping like midazolam  and triazolam  medicines for high blood pressure or heart problems  quinidine  rifampin, rifabutin, or rifapentine This list may not describe all possible interactions. Give your health care provider a list of all the medicines, herbs, non-prescription drugs, or dietary supplements you use. Also tell them if you smoke, drink alcohol, or use illegal drugs. Some items may interact with your medicine. What should I watch for while using this medicine? Visit your health care provider for regular checks on your progress. Check your blood pressure as directed. Ask your health care provider what your blood pressure should be. Also, find out when you should contact him or her. Do not treat yourself for coughs, colds, or pain while you are using this drug without asking your health care provider for advice. Some drugs may increase your blood pressure. This drug may cause serious skin reactions. They can happen weeks to months after starting the drug. Contact your health care provider right away if you notice fevers or flu-like symptoms with a rash. The rash may be red or purple and then turn into blisters or peeling of the skin. Or, you might notice a red rash with swelling of the face, lips or lymph nodes in your neck or under your arms. You may get drowsy or dizzy. Do not drive, use machinery, or do anything that needs mental alertness until you know how this drug affects you. Do not stand up or sit up quickly, especially if you are an older patient. This reduces the risk of dizzy or fainting spells. What side effects may I notice from receiving this medicine? Side effects that you should report to your doctor or health care provider as soon as possible:  allergic reactions (skin rash, itching or hives; swelling of the face, lips, or tongue)  heart failure (trouble breathing; fast, irregular heartbeat; sudden weight gain; swelling of the ankles, feet, hands; unusually weak or tired)  heartbeat rhythm  changes (trouble breathing; chest pain; dizziness; fast, irregular heartbeat; feeling faint or lightheaded, falls)  liver injury (dark yellow or brown urine; general ill feeling or flu-like symptoms; loss of appetite, right upper belly pain; unusually weak or tired, yellowing of the eyes or skin)  redness, blistering, peeling, or loosening of the skin, including inside the mouth Side effects that usually do not require medical attention (report to your doctor or health care provider if they continue or are bothersome):  changes in sex drive or performance  changes in vision  cough  depressed mood  headache  nasal congestion (like runny or stuffy nose)  sudden weight gain  trouble sleeping This list may not describe all possible side effects. Call your doctor for medical advice about side effects. You may report side effects to FDA at 1-800-FDA-1088. Where should I keep my medicine? Keep out of the reach of children and pets. Store at room temperature between 20 and 25 degrees C (68 and 77 degrees F). Protect from moisture. Keep the container tightly closed. Throw away any unused drug after the expiration date. NOTE: This sheet is  a summary. It may not cover all possible information. If you have questions about this medicine, talk to your doctor, pharmacist, or health care provider.  2020 Elsevier/Gold Standard (2019-06-15 14:48:13)  Flecainide tablets What is this medicine? FLECAINIDE (FLEK a nide) is an antiarrhythmic drug. This medicine is used to prevent irregular heart rhythm. It can also slow down fast heartbeats called tachycardia. This medicine may be used for other purposes; ask your health care provider or pharmacist if you have questions. COMMON BRAND NAME(S): Tambocor What should I tell my health care provider before I take this medicine? They need to know if you have any of these conditions:  abnormal levels of potassium in the blood  heart disease including heart  rhythm and heart rate problems  kidney or liver disease  recent heart attack  an unusual or allergic reaction to flecainide, local anesthetics, other medicines, foods, dyes, or preservatives  pregnant or trying to get pregnant  breast-feeding How should I use this medicine? Take this medicine by mouth with a glass of water. Follow the directions on the prescription label. You can take this medicine with or without food. Take your doses at regular intervals. Do not take your medicine more often than directed. Do not stop taking this medicine suddenly. This may cause serious, heart-related side effects. If your doctor wants you to stop the medicine, the dose may be slowly lowered over time to avoid any side effects. Talk to your pediatrician regarding the use of this medicine in children. While this drug may be prescribed for children as young as 1 year of age for selected conditions, precautions do apply. Overdosage: If you think you have taken too much of this medicine contact a poison control center or emergency room at once. NOTE: This medicine is only for you. Do not share this medicine with others. What if I miss a dose? If you miss a dose, take it as soon as you can. If it is almost time for your next dose, take only that dose. Do not take double or extra doses. What may interact with this medicine? Do not take this medicine with any of the following medications:  amoxapine  arsenic trioxide  certain antibiotics like clarithromycin, erythromycin, gatifloxacin, gemifloxacin, levofloxacin, moxifloxacin, sparfloxacin, or troleandomycin  certain antidepressants called tricyclic antidepressants like amitriptyline, imipramine, or nortriptyline  certain medicines to control heart rhythm like disopyramide, encainide, moricizine, procainamide, propafenone, and  quinidine  cisapride  delavirdine  droperidol  haloperidol  hawthorn  imatinib  levomethadyl  maprotiline  medicines for malaria like chloroquine and halofantrine  pentamidine  phenothiazines like chlorpromazine, mesoridazine, prochlorperazine, thioridazine  pimozide  quinine  ranolazine  ritonavir  sertindole This medicine may also interact with the following medications:  cimetidine  dofetilide  medicines for angina or high blood pressure  medicines to control heart rhythm like amiodarone and digoxin  ziprasidone This list may not describe all possible interactions. Give your health care provider a list of all the medicines, herbs, non-prescription drugs, or dietary supplements you use. Also tell them if you smoke, drink alcohol, or use illegal drugs. Some items may interact with your medicine. What should I watch for while using this medicine? Visit your doctor or health care professional for regular checks on your progress. Because your condition and the use of this medicine carries some risk, it is a good idea to carry an identification card, necklace or bracelet with details of your condition, medications and doctor or health care professional. Check your blood  pressure and pulse rate regularly. Ask your health care professional what your blood pressure and pulse rate should be, and when you should contact him or her. Your doctor or health care professional also may schedule regular blood tests and electrocardiograms to check your progress. You may get drowsy or dizzy. Do not drive, use machinery, or do anything that needs mental alertness until you know how this medicine affects you. Do not stand or sit up quickly, especially if you are an older patient. This reduces the risk of dizzy or fainting spells. Alcohol can make you more dizzy, increase flushing and rapid heartbeats. Avoid alcoholic drinks. What side effects may I notice from receiving this  medicine? Side effects that you should report to your doctor or health care professional as soon as possible:  chest pain, continued irregular heartbeats  difficulty breathing  swelling of the legs or feet  trembling, shaking  unusually weak or tired Side effects that usually do not require medical attention (report to your doctor or health care professional if they continue or are bothersome):  blurred vision  constipation  headache  nausea, vomiting  stomach pain This list may not describe all possible side effects. Call your doctor for medical advice about side effects. You may report side effects to FDA at 1-800-FDA-1088. Where should I keep my medicine? Keep out of the reach of children. Store at room temperature between 15 and 30 degrees C (59 and 86 degrees F). Protect from light. Keep container tightly closed. Throw away any unused medicine after the expiration date. NOTE: This sheet is a summary. It may not cover all possible information. If you have questions about this medicine, talk to your doctor, pharmacist, or health care provider.  2020 Elsevier/Gold Standard (2018-09-14 11:41:38)

## 2020-08-02 LAB — LIPID PANEL
Chol/HDL Ratio: 4.3 ratio (ref 0.0–5.0)
Cholesterol, Total: 178 mg/dL (ref 100–199)
HDL: 41 mg/dL (ref >39)
LDL Chol Calc (NIH): 121 mg/dL — ABNORMAL HIGH (ref 0–99)
Triglycerides: 85 mg/dL (ref 0–149)
VLDL Cholesterol Cal: 16 mg/dL (ref 5–40)

## 2020-08-03 ENCOUNTER — Telehealth: Payer: Self-pay | Admitting: Emergency Medicine

## 2020-08-03 DIAGNOSIS — E785 Hyperlipidemia, unspecified: Secondary | ICD-10-CM

## 2020-08-03 MED ORDER — ATORVASTATIN CALCIUM 10 MG PO TABS
10.0000 mg | ORAL_TABLET | Freq: Every day | ORAL | 1 refills | Status: DC
Start: 2020-08-03 — End: 2021-01-09

## 2020-08-03 NOTE — Telephone Encounter (Signed)
Patient informed of result and recommendations. Patient will start lipitor 10 mg daily and have lab work redrawn in 6 weeks.

## 2020-08-03 NOTE — Telephone Encounter (Signed)
-----   Message from Georgeanna Lea, MD sent at 08/03/2020 12:41 PM EDT ----- Cholesterol elevated, please add Lipitor 10 mg daily to his medical regiment, fasting ED profile within the next 6 weeks

## 2020-08-07 ENCOUNTER — Other Ambulatory Visit: Payer: Self-pay

## 2020-08-07 ENCOUNTER — Encounter: Payer: Self-pay | Admitting: Cardiology

## 2020-08-07 ENCOUNTER — Ambulatory Visit (INDEPENDENT_AMBULATORY_CARE_PROVIDER_SITE_OTHER): Payer: Self-pay | Admitting: Cardiology

## 2020-08-07 VITALS — BP 110/90 | HR 65 | Ht 71.0 in | Wt 204.0 lb

## 2020-08-07 DIAGNOSIS — Z79899 Other long term (current) drug therapy: Secondary | ICD-10-CM

## 2020-08-07 DIAGNOSIS — Z5181 Encounter for therapeutic drug level monitoring: Secondary | ICD-10-CM

## 2020-08-07 NOTE — Patient Instructions (Signed)
Medication Instructions:  Your physician recommends that you continue on your current medications as directed. Please refer to the Current Medication list given to you today.  *If you need a refill on your cardiac medications before your next appointment, please call your pharmacy*   Lab Work: None.  If you have labs (blood work) drawn today and your tests are completely normal, you will receive your results only by: . MyChart Message (if you have MyChart) OR . A paper copy in the mail If you have any lab test that is abnormal or we need to change your treatment, we will call you to review the results.   Testing/Procedures: None.    Follow-Up: At [facility], you and your health needs are our priority.  As part of our continuing mission to provide you with exceptional heart care, we have created designated Provider Care Teams.  These Care Teams include your primary Cardiologist (physician) and Advanced Practice Providers (APPs -  Physician Assistants and Nurse Practitioners) who all work together to provide you with the care you need, when you need it.  We recommend signing up for the patient portal called "MyChart".  Sign up information is provided on this After Visit Summary.  MyChart is used to connect with patients for Virtual Visits (Telemedicine).  Patients are able to view lab/test results, encounter notes, upcoming appointments, etc.  Non-urgent messages can be sent to your provider as well.   To learn more about what you can do with MyChart, go to [email or web address].    Your next appointment:     Follow up with Dr. Krasowski as scheduled.   

## 2020-08-07 NOTE — Progress Notes (Signed)
Cardiology Office Note:    Date:  08/07/2020   ID:  Bradley Jefferson, DOB 05/22/1971, MRN [id number]  PCP:  Wanda PlumpPaz, Jose E, MD  Cardiologist:  No primary care provider on file.  Electrophysiologist:  None   Referring MD: Wanda PlumpPaz, Jose E, MD   Chief Complaint  Patient presents with  . Follow-up   History of Present Illness:    Bradley MarDavid W Bergren is a 49 y.o. male with a hx of paroxysmal atrial fibrillation, on anticoagulation with Xarelto, also recently started flecainide is here today for follow-up for CKD. The patient has no complaints at this time.  Past Medical History:  Diagnosis Date  . Annual physical exam 07/22/2011  . Diabetes mellitus 03/2009  . DM II (diabetes mellitus, type II), controlled (HCC) 03/07/2009   Patient requested to discontinue Actos and metformin and switch to Byetta 08-2010 Metformin restarted 12-2010 but self discontinued due to GI side effects Feet exam normal 11-15 Eye care discussed 10-14   . Dyslipidemia 12/27/2019  . Dyspnea on exertion 12/27/2019  . ETD (eustachian tube dysfunction) 01/21/2012  . EXTERNAL HEMORRHOIDS 10/12/2010   Qualifier: Diagnosis of  By: Drue NovelPaz MD, Nolon RodJose E.   . GERD 03/07/2009   Qualifier: Diagnosis of  By: Shary DecampHawks, Stacia    . GERD (gastroesophageal reflux disease)   . Hypertension 07/05/2018  . Insomnia 02/23/2020  . Low back pain    sees Chiro-herman  . Palpitations 12/27/2019  . Paroxysmal atrial fibrillation (HCC) 04/24/2020  . PCP NOTES >>>>> 08/15/2015    Past Surgical History:  Procedure Laterality Date  . abscess anal gland     s/p I&D    Current Medications: Current Meds  Medication Sig  . ALPRAZolam (XANAX) 0.5 MG tablet TAKE 1 TABLET BY MOUTH AT BEDTIME AS NEEDED FOR ANXIETY.  Marland Kitchen. atorvastatin (LIPITOR) 10 MG tablet Take 1 tablet (10 mg total) by mouth daily.  . cetirizine (KLS ALLER-TEC) 10 MG tablet Take 10 mg by mouth daily.  . Continuous Blood Gluc Receiver (FREESTYLE LIBRE 14 DAY READER) DEVI 1 Device by Does not apply route as  directed.  . Continuous Blood Gluc Sensor (FREESTYLE LIBRE 14 DAY SENSOR) MISC CHECK BLOOD SUGARS AS DIRECTED.  Marland Kitchen. diltiazem (CARDIZEM CD) 120 MG 24 hr capsule Take 1 capsule (120 mg total) by mouth daily.  Marland Kitchen. docusate sodium (COLACE) 100 MG capsule Take 100 mg by mouth daily as needed for mild constipation.  . flecainide (TAMBOCOR) 50 MG tablet Take 1 tablet (50 mg total) by mouth 2 (two) times daily.  . fluticasone (FLONASE) 50 MCG/ACT nasal spray Place 1 spray into both nostrils as needed. OTC   . glimepiride (AMARYL) 2 MG tablet Take 2 mg by mouth daily with breakfast.  . linagliptin (TRADJENTA) 5 MG TABS tablet Take 1 tablet (5 mg total) by mouth daily. Overdue for appt  . pantoprazole (PROTONIX) 40 MG tablet Take 1 tablet (40 mg total) by mouth daily before breakfast.  . rivaroxaban (XARELTO) 20 MG TABS tablet Take 1 tablet (20 mg total) by mouth daily with supper.  . telmisartan (MICARDIS) 80 MG tablet Take 1 tablet (80 mg total) by mouth daily.  Marland Kitchen. triamcinolone cream (KENALOG) 0.1 % Apply 1 application topically 2 (two) times daily. (Patient taking differently: Apply 1 application topically as needed. )     Allergies:   Patient has no known allergies.   Social History   Socioeconomic History  . Marital status: Married    Spouse name: Not on file  .  Number of children: 0  . Years of education: Not on file  . Highest education level: Not on file  Occupational History  . Occupation: manage a car parts department    Employer: [employer]  Tobacco Use  . Smoking status: Former Smoker    Quit date: 07/22/1999    Years since quitting: 21.0  . Smokeless tobacco: Never Used  Substance and Sexual Activity  . Alcohol use: Yes    Comment: socially   . Drug use: No  . Sexual activity: Not on file  Other Topics Concern  . Not on file  Social History Narrative   Lives w/ wife, she has h/o breast ca, also dx w/ H. Lymphoma ~ 03-2016, s/p chemo    Social Determinants of Health    Financial Resource Strain:   . Difficulty of Paying Living Expenses: Not on file  Food Insecurity:   . Worried About Programme researcher, broadcasting/film/video in the Last Year: Not on file  . Ran Out of Food in the Last Year: Not on file  Transportation Needs:   . Lack of Transportation (Medical): Not on file  . Lack of Transportation (Non-Medical): Not on file  Physical Activity:   . Days of Exercise per Week: Not on file  . Minutes of Exercise per Session: Not on file  Stress:   . Feeling of Stress : Not on file  Social Connections:   . Frequency of Communication with Friends and Family: Not on file  . Frequency of Social Gatherings with Friends and Family: Not on file  . Attends Religious Services: Not on file  . Active Member of Clubs or Organizations: Not on file  . Attends Banker Meetings: Not on file  . Marital Status: Not on file     Family History: The patient's family history includes CAD (age of onset: 39) in his father; Diabetes in his father; Hyperlipidemia in his father; Stroke (age of onset: 31) in his mother. There is no history of Colon cancer, Colon polyps, or Prostate cancer.  ROS:   Review of Systems  Constitution: Negative for decreased appetite, fever and weight gain.  HENT: Negative for congestion, ear discharge, hoarse voice and sore throat.   Eyes: Negative for discharge, redness, vision loss in right eye and visual halos.  Cardiovascular: Negative for chest pain, dyspnea on exertion, leg swelling, orthopnea and palpitations.  Respiratory: Negative for cough, hemoptysis, shortness of breath and snoring.   Endocrine: Negative for heat intolerance and polyphagia.  Hematologic/Lymphatic: Negative for bleeding problem. Does not bruise/bleed easily.  Skin: Negative for flushing, nail changes, rash and suspicious lesions.  Musculoskeletal: Negative for arthritis, joint pain, muscle cramps, myalgias, neck pain and stiffness.  Gastrointestinal: Negative for abdominal  pain, bowel incontinence, diarrhea and excessive appetite.  Genitourinary: Negative for decreased libido, genital sores and incomplete emptying.  Neurological: Negative for brief paralysis, focal weakness, headaches and loss of balance.  Psychiatric/Behavioral: Negative for altered mental status, depression and suicidal ideas.  Allergic/Immunologic: Negative for HIV exposure and persistent infections.    EKGs/Labs/Other Studies Reviewed:    The following studies were reviewed today:   EKG:  The ekg ordered today demonstrates sinus rhythm, heart rate 65 bpm QTC slightly prolonged at compared to prior which was normal.  Recent Labs: 12/03/2019: TSH 1.88 04/24/2020: BUN 16; Creatinine, Ser 1.13; Hemoglobin 14.3; Platelets 182; Potassium 4.8; Sodium 139  Recent Lipid Panel    Component Value Date/Time   CHOL 178 08/01/2020 1016  TRIG 85 08/01/2020 1016   HDL 41 08/01/2020 1016   CHOLHDL 4.3 08/01/2020 1016   CHOLHDL 4 02/22/2020 0939   VLDL 33.2 02/22/2020 0939   LDLCALC 121 (H) 08/01/2020 1016   LDLCALC 119 (H) 12/03/2019 1607   LDLDIRECT 106.0 09/29/2018 0909    Physical Exam:    VS:  BP 110/90 (BP Location: Right Arm, Patient Position: Sitting, Cuff Size: Normal)   Pulse 65   Ht 5\' 11"  (1.803 m)   Wt 204 lb (92.5 kg)   SpO2 98%   BMI 28.45 kg/m     Wt Readings from Last 3 Encounters:  08/07/20 204 lb (92.5 kg)  08/01/20 208 lb (94.3 kg)  07/11/20 199 lb (90.3 kg)     GEN: Well nourished, well developed in no acute distress HEENT: Normal NECK: No JVD; No carotid bruits LYMPHATICS: No lymphadenopathy CARDIAC: S1S2 noted,RRR, no murmurs, rubs, gallops RESPIRATORY:  Clear to auscultation without rales, wheezing or rhonchi  ABDOMEN: Soft, non-tender, non-distended, +bowel sounds, no guarding. EXTREMITIES: No edema, No cyanosis, no clubbing MUSCULOSKELETAL:  No deformity  SKIN: Warm and dry NEUROLOGIC:  Alert and oriented x 3, non-focal PSYCHIATRIC:  Normal  affect, good insight  ASSESSMENT:    1. Encounter for monitoring flecainide therapy    PLAN:     1.  I reviewed the patient EKG with him.  All of his questions has been answered.  He will continue his flecainide as well as his Xarelto and Cardizem.  The patient is in agreement with the above plan. The patient left the office in stable condition.  The patient will follow up with Dr. 09/10/20 as scheduled.   Medication Adjustments/Labs and Tests Ordered: Current medicines are reviewed at length with the patient today.  Concerns regarding medicines are outlined above.  Orders Placed This Encounter  Procedures  . EKG 12-Lead   No orders of the defined types were placed in this encounter.   Patient Instructions  Medication Instructions:  Your physician recommends that you continue on your current medications as directed. Please refer to the Current Medication list given to you today.  *If you need a refill on your cardiac medications before your next appointment, please call your pharmacy*   Lab Work: None. If you have labs (blood work) drawn today and your tests are completely normal, you will receive your results only by: Bing Matter MyChart Message (if you have MyChart) OR . A paper copy in the mail If you have any lab test that is abnormal or we need to change your treatment, we will call you to review the results.   Testing/Procedures: None   Follow-Up: At [facility], you and your health needs are our priority.  As part of our continuing mission to provide you with exceptional heart care, we have created designated Provider Care Teams.  These Care Teams include your primary Cardiologist (physician) and Advanced Practice Providers (APPs -  Physician Assistants and Nurse Practitioners) who all work together to provide you with the care you need, when you need it.  We recommend signing up for the patient portal called "MyChart".  Sign up information is provided on this After  Visit Summary.  MyChart is used to connect with patients for Virtual Visits (Telemedicine).  Patients are able to view lab/test results, encounter notes, upcoming appointments, etc.  Non-urgent messages can be sent to your provider as well.   To learn more about what you can do with MyChart, go to [facility].    Your  next appointment:     Follow up with Dr. Bing Matter as scheduled      Adopting a Healthy Lifestyle.  Know what a healthy weight is for you (roughly BMI <25) and aim to maintain this   Aim for 7+ servings of fruits and vegetables daily   65-80+ fluid ounces of water or unsweet tea for healthy kidneys   Limit to max 1 drink of alcohol per day; avoid smoking/tobacco   Limit animal fats in diet for cholesterol and heart health - choose grass fed whenever available   Avoid highly processed foods, and foods high in saturated/trans fats   Aim for low stress - take time to unwind and care for your mental health   Aim for 150 min of moderate intensity exercise weekly for heart health, and weights twice weekly for bone health   Aim for 7-9 hours of sleep daily   When it comes to diets, agreement about the perfect plan isnt easy to find, even among the experts. Experts at the [facility] [employer] developed an idea known as the Healthy Eating Plate. Just imagine a plate divided into logical, healthy portions.   The emphasis is on diet quality:   Load up on vegetables and fruits - one-half of your plate: Aim for color and variety, and remember that potatoes dont count.   Go for whole grains - one-quarter of your plate: Whole wheat, barley, wheat berries, quinoa, oats, brown rice, and foods made with them. If you want pasta, go with whole wheat pasta.   Protein power - one-quarter of your plate: Fish, chicken, beans, and nuts are all healthy, versatile protein sources. Limit red meat.   The diet, however, does go beyond the plate, offering a few other  suggestions.   Use healthy plant oils, such as olive, canola, soy, corn, sunflower and peanut. Check the labels, and avoid partially hydrogenated oil, which have unhealthy trans fats.   If youre thirsty, drink water. Coffee and tea are good in moderation, but skip sugary drinks and limit milk and dairy products to one or two daily servings.   The type of carbohydrate in the diet is more important than the amount. Some sources of carbohydrates, such as vegetables, fruits, whole grains, and beans-are healthier than others.   Finally, stay active  Signed, Thomasene Ripple, DO  08/07/2020 8:49 AM    [facility]

## 2020-08-21 ENCOUNTER — Other Ambulatory Visit: Payer: Self-pay | Admitting: Internal Medicine

## 2020-09-07 ENCOUNTER — Telehealth: Payer: Self-pay | Admitting: Internal Medicine

## 2020-09-07 NOTE — Telephone Encounter (Signed)
Okay to arrange for A1c, AST, ALT (DX DM) at the time of his flu shot.

## 2020-09-07 NOTE — Telephone Encounter (Signed)
Pt asked for CPE appt & flushot.  Since he has to wait for CPE until January patient has requested to do labs when he comes in for flu shot (A1C)   Can we do that for him?  Please advise

## 2020-09-08 ENCOUNTER — Other Ambulatory Visit: Payer: Self-pay

## 2020-09-08 DIAGNOSIS — E119 Type 2 diabetes mellitus without complications: Secondary | ICD-10-CM

## 2020-09-08 NOTE — Telephone Encounter (Signed)
Labs are in and patient advised.

## 2020-09-13 ENCOUNTER — Other Ambulatory Visit: Payer: Self-pay | Admitting: Internal Medicine

## 2020-09-19 ENCOUNTER — Ambulatory Visit (INDEPENDENT_AMBULATORY_CARE_PROVIDER_SITE_OTHER): Payer: No Typology Code available for payment source

## 2020-09-19 ENCOUNTER — Other Ambulatory Visit (INDEPENDENT_AMBULATORY_CARE_PROVIDER_SITE_OTHER): Payer: No Typology Code available for payment source

## 2020-09-19 ENCOUNTER — Other Ambulatory Visit: Payer: Self-pay

## 2020-09-19 DIAGNOSIS — Z23 Encounter for immunization: Secondary | ICD-10-CM

## 2020-09-19 DIAGNOSIS — E119 Type 2 diabetes mellitus without complications: Secondary | ICD-10-CM

## 2020-09-19 LAB — ALT: ALT: 23 U/L (ref 0–53)

## 2020-09-19 LAB — AST: AST: 19 U/L (ref 0–37)

## 2020-09-19 LAB — HEMOGLOBIN A1C: Hgb A1c MFr Bld: 6 % (ref 4.6–6.5)

## 2020-09-19 NOTE — Progress Notes (Signed)
Pre visit review using our clinic review tool, if applicable. No additional management support is needed unless otherwise documented below in the visit note.  Patient here for flu vaccine. 0.5mL flu vaccine given in right deltoid IM. Patient tolerated well. VIS given.   

## 2020-09-20 ENCOUNTER — Other Ambulatory Visit: Payer: Self-pay | Admitting: Internal Medicine

## 2020-09-20 ENCOUNTER — Encounter: Payer: Self-pay | Admitting: Internal Medicine

## 2020-09-21 ENCOUNTER — Other Ambulatory Visit: Payer: Self-pay | Admitting: Internal Medicine

## 2020-09-21 MED ORDER — GLIMEPIRIDE 2 MG PO TABS: 2.0000 mg | ORAL_TABLET | Freq: Every day | ORAL | 1 refills | Status: DC

## 2020-09-21 MED ORDER — LINAGLIPTIN 5 MG PO TABS: 5.0000 mg | ORAL_TABLET | Freq: Every day | ORAL | 1 refills | Status: DC

## 2020-10-14 ENCOUNTER — Other Ambulatory Visit: Payer: Self-pay | Admitting: Internal Medicine

## 2020-11-07 ENCOUNTER — Ambulatory Visit: Payer: No Typology Code available for payment source | Admitting: Cardiology

## 2020-11-07 ENCOUNTER — Other Ambulatory Visit: Payer: Self-pay

## 2020-11-07 ENCOUNTER — Encounter: Payer: Self-pay | Admitting: Cardiology

## 2020-11-07 VITALS — BP 118/78 | HR 69 | Ht 71.0 in | Wt 208.0 lb

## 2020-11-07 DIAGNOSIS — I480 Paroxysmal atrial fibrillation: Secondary | ICD-10-CM

## 2020-11-07 DIAGNOSIS — I10 Essential (primary) hypertension: Secondary | ICD-10-CM

## 2020-11-07 DIAGNOSIS — E785 Hyperlipidemia, unspecified: Secondary | ICD-10-CM | POA: Diagnosis not present

## 2020-11-07 DIAGNOSIS — E119 Type 2 diabetes mellitus without complications: Secondary | ICD-10-CM | POA: Diagnosis not present

## 2020-11-07 NOTE — Patient Instructions (Signed)

## 2020-11-07 NOTE — Progress Notes (Signed)
Cardiology Office Note:    Date:  11/07/2020   ID:  Bradley Jefferson, DOB [birth year], MRN [id number]  PCP:  Bradley Plump, MD  Cardiologist:  Bradley Balsam, MD    Referring MD: Bradley Plump, MD   Chief Complaint  Patient presents with  . Follow-up  Doing very well  History of Present Illness:    Bradley Jefferson is a 50 y.o. male with past medical history significant for paroxysmal atrial fibrillation, successfully suppressed with flecainide, anticoagulated because of CHA2DS2-VASc would be 2, essential hypertension, dyslipidemia, diabetes.  Comes today 2 months of follow-up since he was put on flecainide he is doing great.  Denies have any palpitations there is no chest pain tightness squeezing pressure burning chest no dizziness no passing out overall very happy the way he feels  Past Medical History:  Diagnosis Date  . Annual physical exam 07/22/2011  . Diabetes mellitus 03/2009  . DM II (diabetes mellitus, type II), controlled (HCC) 03/07/2009   Patient requested to discontinue Actos and metformin and switch to Byetta 08-2010 Metformin restarted 12-2010 but self discontinued due to GI side effects Feet exam normal 11-15 Eye care discussed 10-14   . Dyslipidemia 12/27/2019  . Dyspnea on exertion 12/27/2019  . ETD (eustachian tube dysfunction) 01/21/2012  . EXTERNAL HEMORRHOIDS 10/12/2010   Qualifier: Diagnosis of  By: Drue Novel MD, Nolon Rod.   . GERD 03/07/2009   Qualifier: Diagnosis of  By: Shary Decamp    . GERD (gastroesophageal reflux disease)   . Hypertension 07/05/2018  . Insomnia 02/23/2020  . Low back pain    sees Chiro-herman  . Palpitations 12/27/2019  . Paroxysmal atrial fibrillation (HCC) 04/24/2020  . PCP NOTES >>>>> 08/15/2015    Past Surgical History:  Procedure Laterality Date  . abscess anal gland     s/p I&D    Current Medications: Current Meds  Medication Sig  . ALPRAZolam (XANAX) 0.5 MG tablet TAKE 1 TABLET BY MOUTH AT BEDTIME AS NEEDED FOR ANXIETY.  Marland Kitchen atorvastatin  (LIPITOR) 10 MG tablet Take 1 tablet (10 mg total) by mouth daily.  . cetirizine (ZYRTEC) 10 MG tablet Take 10 mg by mouth daily.  . Continuous Blood Gluc Receiver (FREESTYLE LIBRE 14 DAY READER) DEVI 1 Device by Does not apply route as directed.  . Continuous Blood Gluc Sensor (FREESTYLE LIBRE 14 DAY SENSOR) MISC CHECK BLOOD SUGARS AS DIRECTED.  Marland Kitchen diltiazem (CARDIZEM CD) 120 MG 24 hr capsule Take 1 capsule (120 mg total) by mouth daily.  Marland Kitchen docusate sodium (COLACE) 100 MG capsule Take 100 mg by mouth daily as needed for mild constipation.  . flecainide (TAMBOCOR) 50 MG tablet Take 1 tablet (50 mg total) by mouth 2 (two) times daily.  . fluticasone (FLONASE) 50 MCG/ACT nasal spray Place 1 spray into both nostrils as needed. OTC  . glimepiride (AMARYL) 2 MG tablet Take 1 tablet (2 mg total) by mouth daily with breakfast.  . pantoprazole (PROTONIX) 40 MG tablet Take 1 tablet (40 mg total) by mouth daily before breakfast.  . rivaroxaban (XARELTO) 20 MG TABS tablet Take 1 tablet (20 mg total) by mouth daily with supper.  . sitaGLIPtin (JANUVIA) 100 MG tablet Take 100 mg by mouth daily.  Marland Kitchen telmisartan (MICARDIS) 80 MG tablet Take 1 tablet (80 mg total) by mouth daily.  . [DISCONTINUED] triamcinolone cream (KENALOG) 0.1 % Apply 1 application topically 2 (two) times daily. (Patient taking differently: Apply 1 application topically as needed.)     Allergies:  Patient has no known allergies.   Social History   Socioeconomic History  . Marital status: Married    Spouse name: Not on file  . Number of children: 0  . Years of education: Not on file  . Highest education level: Not on file  Occupational History  . Occupation: manage a car parts department    Employer: [employer]  Tobacco Use  . Smoking status: Former Smoker    Quit date: 07/22/1999    Years since quitting: 21.3  . Smokeless tobacco: Never Used  Substance and Sexual Activity  . Alcohol use: Yes    Comment: socially   .  Drug use: No  . Sexual activity: Not on file  Other Topics Concern  . Not on file  Social History Narrative   Lives w/ wife, she has h/o breast ca, also dx w/ H. Lymphoma ~ 03-2016, s/p chemo    Social Determinants of Health   Financial Resource Strain: Not on file  Food Insecurity: Not on file  Transportation Needs: Not on file  Physical Activity: Not on file  Stress: Not on file  Social Connections: Not on file     Family History: The patient's family history includes CAD (age of onset: 13) in his father; Diabetes in his father; Hyperlipidemia in his father; Stroke (age of onset: 66) in his mother. There is no history of Colon cancer, Colon polyps, or Prostate cancer. ROS:   Please see the history of present illness.    All 14 point review of systems negative except as described per history of present illness  EKGs/Labs/Other Studies Reviewed:      Recent Labs: 12/03/2019: TSH 1.88 04/24/2020: BUN 16; Creatinine, Ser 1.13; Hemoglobin 14.3; Platelets 182; Potassium 4.8; Sodium 139 09/19/2020: ALT 23  Recent Lipid Panel    Component Value Date/Time   CHOL 178 08/01/2020 1016   TRIG 85 08/01/2020 1016   HDL 41 08/01/2020 1016   CHOLHDL 4.3 08/01/2020 1016   CHOLHDL 4 02/22/2020 0939   VLDL 33.2 02/22/2020 0939   LDLCALC 121 (H) 08/01/2020 1016   LDLCALC 119 (H) 12/03/2019 1607   LDLDIRECT 106.0 09/29/2018 0909    Physical Exam:    VS:  BP 118/78 (BP Location: Left Arm, Patient Position: Sitting)   Pulse 69   Ht 5\' 11"  (1.803 m)   Wt 208 lb (94.3 kg)   SpO2 99%   BMI 29.01 kg/m     Wt Readings from Last 3 Encounters:  11/07/20 208 lb (94.3 kg)  08/07/20 204 lb (92.5 kg)  08/01/20 208 lb (94.3 kg)     GEN:  Well nourished, well developed in no acute distress HEENT: Normal NECK: No JVD; No carotid bruits LYMPHATICS: No lymphadenopathy CARDIAC: RRR, no murmurs, no rubs, no gallops RESPIRATORY:  Clear to auscultation without rales, wheezing or rhonchi   ABDOMEN: Soft, non-tender, non-distended MUSCULOSKELETAL:  No edema; No deformity  SKIN: Warm and dry LOWER EXTREMITIES: no swelling NEUROLOGIC:  Alert and oriented x 3 PSYCHIATRIC:  Normal affect   ASSESSMENT:    1. Paroxysmal atrial fibrillation (HCC)   2. Primary hypertension   3. Controlled type 2 diabetes mellitus without complication, without long-term current use of insulin (HCC)   4. Dyslipidemia    PLAN:    In order of problems listed above:  1. Paroxysmal atrial fibrillation doing well from that point review of suppressed with flecainide will continue.  QT interval on EKG today is normal QS complex duration morphology is  normal as well. 2. Essential hypertension blood pressure well controlled continue present management. 3. Type 2 diabetes Sahar he said last hemoglobin A1c according to K PN was 6.0 he does have continuous glucose monitor which helps him tremendously. 4. Dyslipidemia I did review his K PN which show me his LDL 121 HDL 41 this is from October he did change his diet dramatically this is because of CGM that he has.  He anticipate he should glucose/cholesterol being much better.  He want a wait until his primary care physician will check his glucose as well as a fasting lipid profile which will be in about 3 to 4 weeks.  Will wait for results of this and just accordingly.  He does take moderate intensity statin form of Lipitor 10 mg which I will continue.   Medication Adjustments/Labs and Tests Ordered: Current medicines are reviewed at length with the patient today.  Concerns regarding medicines are outlined above.  Orders Placed This Encounter  Procedures  . EKG 12-Lead   Medication changes: No orders of the defined types were placed in this encounter.   Signed, Georgeanna Lea, MD, [facility] 11/07/2020 8:29 AM    [facility]

## 2020-11-08 ENCOUNTER — Other Ambulatory Visit: Payer: Self-pay | Admitting: Emergency Medicine

## 2020-11-08 MED ORDER — RIVAROXABAN 20 MG PO TABS: 20.0000 mg | ORAL_TABLET | Freq: Every day | ORAL | 2 refills | Status: DC

## 2020-11-22 ENCOUNTER — Telehealth: Payer: Self-pay

## 2020-11-22 ENCOUNTER — Encounter: Payer: Self-pay | Admitting: Internal Medicine

## 2020-11-22 ENCOUNTER — Other Ambulatory Visit: Payer: Self-pay | Admitting: Internal Medicine

## 2020-11-22 ENCOUNTER — Other Ambulatory Visit: Payer: Self-pay

## 2020-11-22 MED ORDER — SITAGLIPTIN PHOSPHATE 100 MG PO TABS: 100.0000 mg | ORAL_TABLET | Freq: Every day | ORAL | 0 refills | Status: DC

## 2020-11-22 MED ORDER — PANTOPRAZOLE SODIUM 40 MG PO TBEC: 40.0000 mg | DELAYED_RELEASE_TABLET | Freq: Every day | ORAL | 0 refills | Status: DC

## 2020-11-22 NOTE — Telephone Encounter (Signed)
PA initiated via Covermymeds; KEY: [id number]. PA approved.

## 2020-11-23 NOTE — Telephone Encounter (Signed)
Effective 11/22/2020 to 11/22/2023.

## 2020-12-15 ENCOUNTER — Other Ambulatory Visit: Payer: Self-pay | Admitting: Internal Medicine

## 2020-12-16 ENCOUNTER — Other Ambulatory Visit: Payer: Self-pay | Admitting: Internal Medicine

## 2021-01-09 ENCOUNTER — Encounter: Payer: Self-pay | Admitting: Internal Medicine

## 2021-01-09 ENCOUNTER — Other Ambulatory Visit: Payer: Self-pay

## 2021-01-09 ENCOUNTER — Ambulatory Visit (INDEPENDENT_AMBULATORY_CARE_PROVIDER_SITE_OTHER): Payer: No Typology Code available for payment source | Admitting: Internal Medicine

## 2021-01-09 VITALS — BP 129/81 | HR 62 | Temp 97.3°F | Ht 71.0 in | Wt 199.0 lb

## 2021-01-09 DIAGNOSIS — Z79899 Other long term (current) drug therapy: Secondary | ICD-10-CM

## 2021-01-09 DIAGNOSIS — I10 Essential (primary) hypertension: Secondary | ICD-10-CM | POA: Diagnosis not present

## 2021-01-09 DIAGNOSIS — Z0000 Encounter for general adult medical examination without abnormal findings: Secondary | ICD-10-CM

## 2021-01-09 DIAGNOSIS — I480 Paroxysmal atrial fibrillation: Secondary | ICD-10-CM | POA: Diagnosis not present

## 2021-01-09 DIAGNOSIS — E119 Type 2 diabetes mellitus without complications: Secondary | ICD-10-CM

## 2021-01-09 DIAGNOSIS — Z125 Encounter for screening for malignant neoplasm of prostate: Secondary | ICD-10-CM

## 2021-01-09 LAB — COMPREHENSIVE METABOLIC PANEL
ALT: 18 U/L (ref 0–53)
AST: 17 U/L (ref 0–37)
Albumin: 5 g/dL (ref 3.5–5.2)
Alkaline Phosphatase: 44 U/L (ref 39–117)
BUN: 15 mg/dL (ref 6–23)
CO2: 29 mEq/L (ref 19–32)
Calcium: 9.7 mg/dL (ref 8.4–10.5)
Chloride: 102 mEq/L (ref 96–112)
Creatinine, Ser: 1.03 mg/dL (ref 0.40–1.50)
GFR: 85.81 mL/min (ref >60.00)
Glucose, Bld: 108 mg/dL — ABNORMAL HIGH (ref 70–99)
Potassium: 4.5 mEq/L (ref 3.5–5.1)
Sodium: 139 mEq/L (ref 135–145)
Total Bilirubin: 0.7 mg/dL (ref 0.2–1.2)
Total Protein: 7.2 g/dL (ref 6.0–8.3)

## 2021-01-09 LAB — CBC WITH DIFFERENTIAL/PLATELET
Basophils Absolute: 0 10*3/uL (ref 0.0–0.1)
Basophils Relative: 0.6 % (ref 0.0–3.0)
Eosinophils Absolute: 0.1 10*3/uL (ref 0.0–0.7)
Eosinophils Relative: 1.8 % (ref 0.0–5.0)
HCT: 43.3 % (ref 39.0–52.0)
Hemoglobin: 15 g/dL (ref 13.0–17.0)
Lymphocytes Relative: 17.4 % (ref 12.0–46.0)
Lymphs Abs: 1.4 10*3/uL (ref 0.7–4.0)
MCHC: 34.6 g/dL (ref 30.0–36.0)
MCV: 89.6 fl (ref 78.0–100.0)
Monocytes Absolute: 0.4 10*3/uL (ref 0.1–1.0)
Monocytes Relative: 5.3 % (ref 3.0–12.0)
Neutro Abs: 5.9 10*3/uL (ref 1.4–7.7)
Neutrophils Relative %: 74.9 % (ref 43.0–77.0)
Platelets: 174 10*3/uL (ref 150.0–400.0)
RBC: 4.84 Mil/uL (ref 4.22–5.81)
RDW: 12.8 % (ref 11.5–15.5)
WBC: 7.8 10*3/uL (ref 4.0–10.5)

## 2021-01-09 LAB — LIPID PANEL
Cholesterol: 134 mg/dL (ref 0–200)
HDL: 45.2 mg/dL (ref >39.00)
LDL Cholesterol: 65 mg/dL (ref 0–99)
NonHDL: 88.95
Total CHOL/HDL Ratio: 3
Triglycerides: 121 mg/dL (ref 0.0–149.0)
VLDL: 24.2 mg/dL (ref 0.0–40.0)

## 2021-01-09 LAB — TSH: TSH: 1.58 u[IU]/mL (ref 0.35–4.50)

## 2021-01-09 LAB — PSA: PSA: 2.74 ng/mL (ref 0.10–4.00)

## 2021-01-09 LAB — HEMOGLOBIN A1C: Hgb A1c MFr Bld: 6.2 % (ref 4.6–6.5)

## 2021-01-09 NOTE — Progress Notes (Addendum)
Subjective:    Patient ID: Bradley Jefferson, male    DOB: 1971-07-12, 50 y.o.   MRN: [id number]  DOS:  01/09/2021 Type of visit - description: Here for CPX  Since the last office visit, was diagnosed with atrial fibrillation. Good med compliance, essentially no symptoms. Specifically denies chest pain, difficulty breathing or edema. No blood in the stools or in the urine  Review of Systems  Other than above, a 14 point review of systems is negative      Past Medical History:  Diagnosis Date  . Annual physical exam 07/22/2011  . Diabetes mellitus 03/2009  . DM II (diabetes mellitus, type II), controlled (HCC) 03/07/2009   Patient requested to discontinue Actos and metformin and switch to Byetta 08-2010 Metformin restarted 12-2010 but self discontinued due to GI side effects Feet exam normal 11-15 Eye care discussed 10-14   . Dyslipidemia 12/27/2019  . Dyspnea on exertion 12/27/2019  . ETD (eustachian tube dysfunction) 01/21/2012  . EXTERNAL HEMORRHOIDS 10/12/2010   Qualifier: Diagnosis of  By: Drue Novel MD, Nolon Rod.   . GERD 03/07/2009   Qualifier: Diagnosis of  By: Shary Decamp    . GERD (gastroesophageal reflux disease)   . Hypertension 07/05/2018  . Insomnia 02/23/2020  . Low back pain    sees Chiro-herman  . Palpitations 12/27/2019  . Paroxysmal atrial fibrillation (HCC) 04/24/2020    Past Surgical History:  Procedure Laterality Date  . abscess anal gland     s/p I&D    Allergies as of 01/09/2021   No Known Allergies     Medication List       Accurate as of January 09, 2021  6:54 PM. If you have any questions, ask your nurse or doctor.        ALPRAZolam 0.5 MG tablet Commonly known as: XANAX TAKE 1 TABLET BY MOUTH AT BEDTIME AS NEEDED FOR ANXIETY.   atorvastatin 10 MG tablet Commonly known as: LIPITOR Take 1 tablet (10 mg total) by mouth every other day as needed. What changed:   when to take this  reasons to take this Changed by: Willow Ora, MD   cetirizine 10 MG  tablet Commonly known as: ZYRTEC Take 10 mg by mouth daily.   diltiazem 120 MG 24 hr capsule Commonly known as: CARDIZEM CD Take 1 capsule (120 mg total) by mouth daily.   docusate sodium 100 MG capsule Commonly known as: COLACE Take 100 mg by mouth daily as needed for mild constipation.   flecainide 50 MG tablet Commonly known as: TAMBOCOR Take 1 tablet (50 mg total) by mouth 2 (two) times daily.   fluticasone 50 MCG/ACT nasal spray Commonly known as: FLONASE Place 1 spray into both nostrils as needed. OTC   FreeStyle Libre 14 Day Reader Devi 1 Device by Does not apply route as directed.   FreeStyle Libre 14 Day Sensor Misc CHECK BLOOD SUGARS AS DIRECTED.   glimepiride 2 MG tablet Commonly known as: AMARYL Take 1 tablet (2 mg total) by mouth daily with breakfast.   pantoprazole 40 MG tablet Commonly known as: PROTONIX Take 1 tablet (40 mg total) by mouth daily before breakfast.   rivaroxaban 20 MG Tabs tablet Commonly known as: XARELTO Take 1 tablet (20 mg total) by mouth daily with supper.   sitaGLIPtin 100 MG tablet Commonly known as: Januvia Take 1 tablet (100 mg total) by mouth daily.   telmisartan 80 MG tablet Commonly known as: MICARDIS Take 1 tablet (80 mg total) by  mouth daily.          Objective:   Physical Exam BP 129/81 (BP Location: Right Arm, Patient Position: Sitting, Cuff Size: Large)   Pulse 62   Temp (!) 97.3 F (36.3 C) (Temporal)   Ht 5\' 11"  (1.803 m)   Wt 199 lb (90.3 kg)   SpO2 99%   BMI 27.75 kg/m  General: Well developed, NAD, BMI noted Neck: No  thyromegaly  HEENT:  Normocephalic . Face symmetric, atraumatic Lungs:  CTA B Normal respiratory effort, no intercostal retractions, no accessory muscle use. Heart: RRR,  no murmur.  Abdomen:  Not distended, soft, non-tender. No rebound or rigidity.   Lower extremities: no pretibial edema bilaterally  Skin: Exposed areas without rash. Not pale. Not jaundice Neurologic:  alert  & oriented X3.  Speech normal, gait appropriate for age and unassisted Strength symmetric and appropriate for age.  Psych: Cognition and judgment appear intact.  Cooperative with normal attention span and concentration.  Behavior appropriate. No anxious or depressed appearing.     Assessment     Assessment   DM 2010, no neuropathy Insomnia : xanax rarely  HTN dx 2-17 GERD Occasional low back pain, sees a chiropractor. Occasional increase LFTs: Hepatitis B and C negative 12-2016 Ear eczema: Occasionally uses triamcinolone. COVID-25 September 2019 Atrial fibrillation, W/U initially neg, subsequently A. fib noted on his wrist watch, DX 04-2020  PLAN Here for CPX, last visit 02/2020 DM: Good compliance with Glimepiride, Januvia, has CGM, 90% of the time he is in range.  Very rarely has low sugars (less than 100).  Feet exam negative today, no change, checking labs Insomnia, rarely takes Xanax, check UDS. HTN: BP today is very good, continue cardiac same, Micardis. High cholesterol: Self decrease Lipitor to every other day due to leg pain which is now resolved.  Check FLP. A FIB: new since las visit, DX 04-2020, on Cardizem, Tambocor, Xarelto.  Seems to be doing well. RTC 6 months   In addition to CPX, we addressed all his chronic medical problems and reviewed the chart  This visit occurred during the SARS-CoV-2 public health emergency.  Safety protocols were in place, including screening questions prior to the visit, additional usage of staff PPE, and extensive cleaning of exam room while observing appropriate contact time as indicated for disinfecting solutions.

## 2021-01-09 NOTE — Assessment & Plan Note (Signed)
Here for CPX, last visit 02/2020 DM: Good compliance with Glimepiride, Januvia, has CGM, 90% of the time he is in range.  Very rarely has low sugars (less than 100).  Feet exam negative today, no change, checking labs Insomnia, rarely takes Xanax, check UDS. HTN: BP today is very good, continue cardiac same, Micardis. High cholesterol: Self decrease Lipitor to every other day due to leg pain which is now resolved.  Check FLP. A FIB: new since las visit, DX 04-2020, on Cardizem, Tambocor, Xarelto.  Seems to be doing well. RTC 6 months

## 2021-01-09 NOTE — Patient Instructions (Signed)
Check the  blood pressure   BP GOAL is between 110/65 and  135/85. If it is consistently higher or lower, let me know  Please notify me if you have frequent episodes of low blood sugar  GO TO THE LAB : Get the blood work     GO TO THE FRONT DESK, PLEASE SCHEDULE YOUR APPOINTMENTS Come back for a checkup in 6 months

## 2021-01-09 NOTE — Assessment & Plan Note (Signed)
-  Td 2018 - PNM 2014; Prevnar 2016 -COVID VAX: J&J x1, moderna booster 09/2020. Ok booster by 03-2021 - Had a Flu shot   - (+) FH CV Dz: plan is control CV RF  - CCS: mother had multiple polyps at age 49, Cscope at [facility] 05/2020, +polyps per pt, next per GI -Prostate cancer screening: No symptoms, check PSA, DRE at next physical - labs:  CMP, FLP, CBC, TSH, A1c, PSA, UDS -Lifestyle: Diet is healthy, no exercise per se but he is very active.

## 2021-01-10 ENCOUNTER — Encounter: Payer: Self-pay | Admitting: Internal Medicine

## 2021-01-10 LAB — DRUG MONITORING, PANEL 8 WITH CONFIRMATION, URINE
6 Acetylmorphine: NEGATIVE ng/mL (ref <10)
Alcohol Metabolites: NEGATIVE ng/mL
Amphetamines: NEGATIVE ng/mL (ref <500)
Benzodiazepines: NEGATIVE ng/mL (ref <100)
Buprenorphine, Urine: NEGATIVE ng/mL (ref <5)
Cocaine Metabolite: NEGATIVE ng/mL (ref <150)
Creatinine: 35.4 mg/dL
MDMA: NEGATIVE ng/mL (ref <500)
Marijuana Metabolite: NEGATIVE ng/mL (ref <20)
Opiates: NEGATIVE ng/mL (ref <100)
Oxidant: NEGATIVE ug/mL
Oxycodone: NEGATIVE ng/mL (ref <100)
pH: 5.7 (ref 4.5–9.0)

## 2021-01-10 LAB — DM TEMPLATE

## 2021-01-11 ENCOUNTER — Other Ambulatory Visit: Payer: Self-pay | Admitting: Cardiology

## 2021-01-11 NOTE — Telephone Encounter (Signed)
Refill sent to pharmacy.   

## 2021-01-16 ENCOUNTER — Encounter: Payer: Self-pay | Admitting: Internal Medicine

## 2021-01-19 ENCOUNTER — Other Ambulatory Visit: Payer: Self-pay | Admitting: Internal Medicine

## 2021-01-24 ENCOUNTER — Encounter: Payer: Self-pay | Admitting: Internal Medicine

## 2021-01-24 ENCOUNTER — Other Ambulatory Visit: Payer: Self-pay | Admitting: Internal Medicine

## 2021-01-25 NOTE — Telephone Encounter (Signed)
Close encounter 

## 2021-01-29 ENCOUNTER — Telehealth: Payer: Self-pay

## 2021-01-29 ENCOUNTER — Encounter: Payer: Self-pay | Admitting: Internal Medicine

## 2021-01-29 NOTE — Telephone Encounter (Signed)
PA initiated via Covermymeds; KEY: [id number]. PA approved.

## 2021-01-30 NOTE — Telephone Encounter (Signed)
Effective 01/29/2021 to 01/29/2022.

## 2021-03-24 ENCOUNTER — Other Ambulatory Visit: Payer: Self-pay | Admitting: Internal Medicine

## 2021-04-08 ENCOUNTER — Other Ambulatory Visit: Payer: Self-pay | Admitting: Internal Medicine

## 2021-05-25 LAB — HM DIABETES EYE EXAM

## 2021-07-12 ENCOUNTER — Other Ambulatory Visit: Payer: Self-pay | Admitting: Internal Medicine

## 2021-07-12 ENCOUNTER — Other Ambulatory Visit: Payer: Self-pay | Admitting: Cardiology

## 2021-07-12 NOTE — Telephone Encounter (Signed)
Diltiazem CD 120 mg # 90 x 1 refill Flecainide 50 mg # 180 x 1 refill Sent to   [facility] [place],  - [address]

## 2021-07-20 ENCOUNTER — Encounter: Payer: Self-pay | Admitting: Internal Medicine

## 2021-07-20 ENCOUNTER — Other Ambulatory Visit: Payer: Self-pay | Admitting: Internal Medicine

## 2021-07-20 MED ORDER — HYDROCODONE BIT-HOMATROP MBR 5-1.5 MG/5ML PO SOLN: 5.0000 mL | Freq: Every evening | ORAL | 0 refills | Status: DC | PRN

## 2021-07-23 ENCOUNTER — Other Ambulatory Visit: Payer: Self-pay | Admitting: Internal Medicine

## 2021-07-23 MED ORDER — HYDROCODONE BIT-HOMATROP MBR 5-1.5 MG/5ML PO SOLN: 5.0000 mL | Freq: Two times a day (BID) | ORAL | 0 refills | Status: DC | PRN

## 2021-07-23 NOTE — Progress Notes (Signed)
PDMP: Patient got 7-day supply few days ago but he is requiring higher doses.  New Rx sent.

## 2021-07-27 ENCOUNTER — Other Ambulatory Visit: Payer: Self-pay

## 2021-07-27 ENCOUNTER — Ambulatory Visit: Payer: No Typology Code available for payment source | Admitting: Family Medicine

## 2021-07-27 ENCOUNTER — Encounter: Payer: Self-pay | Admitting: Family Medicine

## 2021-07-27 VITALS — BP 130/82 | HR 64 | Temp 98.3°F | Resp 18 | Ht 71.0 in | Wt 205.8 lb

## 2021-07-27 DIAGNOSIS — J0100 Acute maxillary sinusitis, unspecified: Secondary | ICD-10-CM | POA: Diagnosis not present

## 2021-07-27 MED ORDER — DOXYCYCLINE HYCLATE 100 MG PO TABS: 100.0000 mg | ORAL_TABLET | Freq: Two times a day (BID) | ORAL | 0 refills | Status: AC

## 2021-07-27 NOTE — Patient Instructions (Addendum)
Continue to push fluids, practice good hand hygiene, and cover your mouth if you cough.  If you start having fevers, shaking or shortness of breath, seek immediate care.  Flonase (fluticasone); nasal spray that is over the counter. 2 sprays each nostril, once daily. Aim towards the same side eye when you spray.  Don't take the antibiotic (doxycycline) unless your symptoms start to worsen.   Let us know if you need anything.

## 2021-07-27 NOTE — Progress Notes (Signed)
Chief Complaint  Patient presents with   Follow-up    Follow up on cough/sinus/ neg covid. Pt says as of today he is feeling some better.    Rae Mar here for URI complaints.  Duration:  11  days  Associated symptoms: sinus congestion, sinus pain, itchy watery eyes, ear fullness, and coughing Denies: rhinorrhea, ear pain, ear drainage, sore throat, wheezing, shortness of breath, myalgia, and fevers, N/V, loss of taste/smell Treatment to date: Hycodan, Sudafed, Mucinex DM, Tylenol Sick contacts: No Tested neg for covid.  Starting to get better.   Past Medical History:  Diagnosis Date   Annual physical exam 07/22/2011   Diabetes mellitus 03/2009   DM II (diabetes mellitus, type II), controlled (HCC) 03/07/2009   Patient requested to discontinue Actos and metformin and switch to Byetta 08-2010 Metformin restarted 12-2010 but self discontinued due to GI side effects Feet exam normal 11-15 Eye care discussed 10-14    Dyslipidemia 12/27/2019   Dyspnea on exertion 12/27/2019   ETD (eustachian tube dysfunction) 01/21/2012   EXTERNAL HEMORRHOIDS 10/12/2010   Qualifier: Diagnosis of  By: Drue Novel MD, Jose E.    GERD 03/07/2009   Qualifier: Diagnosis of  By: Shary Decamp     GERD (gastroesophageal reflux disease)    Hypertension 07/05/2018   Insomnia 02/23/2020   Low back pain    sees Chiro-herman   Palpitations 12/27/2019   Paroxysmal atrial fibrillation (HCC) 04/24/2020    Objective BP 130/82 (BP Location: Left Arm, Patient Position: Sitting, Cuff Size: Large)   Pulse 64   Temp 98.3 F (36.8 C) (Oral)   Resp 18   Ht 5\' 11"  (1.803 m)   Wt 205 lb 12.8 oz (93.4 kg)   SpO2 98%   BMI 28.70 kg/m  General: Awake, alert, appears stated age HEENT: AT, [place], ears patent b/l and TM's neg, nares patent w/o discharge, pharynx pink and without exudates, MMM Neck: No masses or asymmetry Heart: RRR Lungs: CTAB, no accessory muscle use Psych: Age appropriate judgment and insight, normal mood and  affect  Acute maxillary sinusitis, recurrence not specified  Pocket rx for doxy given. Likely won't need.  Continue to push fluids, practice good hand hygiene, cover mouth when coughing. INCS +/- Astelin spray and/or Zyrtec. F/u prn. If starting to experience fevers, shaking, or shortness of breath, seek immediate care. Pt voiced understanding and agreement to the plan.  [place], DO 07/27/21 11:33 AM

## 2021-07-31 ENCOUNTER — Other Ambulatory Visit: Payer: Self-pay | Admitting: Family Medicine

## 2021-07-31 MED ORDER — BENZONATATE 200 MG PO CAPS: 200.0000 mg | ORAL_CAPSULE | Freq: Three times a day (TID) | ORAL | 0 refills | Status: DC | PRN

## 2021-07-31 MED ORDER — HYDROCODONE BIT-HOMATROP MBR 5-1.5 MG/5ML PO SOLN: 5.0000 mL | Freq: Two times a day (BID) | ORAL | 0 refills | Status: DC | PRN

## 2021-08-05 ENCOUNTER — Other Ambulatory Visit: Payer: Self-pay | Admitting: Cardiology

## 2021-08-05 ENCOUNTER — Other Ambulatory Visit: Payer: Self-pay | Admitting: Internal Medicine

## 2021-08-06 NOTE — Telephone Encounter (Signed)
LM to return my call regarding Atorvastatin instructions

## 2021-08-06 NOTE — Telephone Encounter (Signed)
Rx refill sent to pharmacy. 

## 2021-08-06 NOTE — Telephone Encounter (Signed)
Prescription refill request for Xarelto received and refilled. Routing the lipitor back to  refills Indication:afib Last office visit:krasowski 11/07/20 Weight:93.4kg Age: 37m Scr: 1.03 01/09/21 CrCl:114.6

## 2021-08-08 ENCOUNTER — Other Ambulatory Visit: Payer: Self-pay

## 2021-08-08 ENCOUNTER — Encounter: Payer: Self-pay | Admitting: Cardiology

## 2021-08-08 ENCOUNTER — Ambulatory Visit: Payer: No Typology Code available for payment source | Admitting: Cardiology

## 2021-08-08 VITALS — BP 128/90 | HR 63 | Ht 71.0 in | Wt 206.0 lb

## 2021-08-08 DIAGNOSIS — E785 Hyperlipidemia, unspecified: Secondary | ICD-10-CM | POA: Diagnosis not present

## 2021-08-08 DIAGNOSIS — I10 Essential (primary) hypertension: Secondary | ICD-10-CM | POA: Diagnosis not present

## 2021-08-08 DIAGNOSIS — I480 Paroxysmal atrial fibrillation: Secondary | ICD-10-CM | POA: Diagnosis not present

## 2021-08-08 DIAGNOSIS — E119 Type 2 diabetes mellitus without complications: Secondary | ICD-10-CM | POA: Diagnosis not present

## 2021-08-08 NOTE — Patient Instructions (Signed)
Medication Instructions:  Your physician recommends that you continue on your current medications as directed. Please refer to the Current Medication list given to you today.  *If you need a refill on your cardiac medications before your next appointment, please call your pharmacy*   Lab Work: Your physician recommends that you return for lab work in: Lipid Profile in 3 weeks fasting   If you have labs (blood work) drawn today and your tests are completely normal, you will receive your results only by: MyChart Message (if you have MyChart) OR A paper copy in the mail If you have any lab test that is abnormal or we need to change your treatment, we will call you to review the results.   Testing/Procedures: None    Follow-Up: At [facility], you and your health needs are our priority.  As part of our continuing mission to provide you with exceptional heart care, we have created designated Provider Care Teams.  These Care Teams include your primary Cardiologist (physician) and Advanced Practice Providers (APPs -  Physician Assistants and Nurse Practitioners) who all work together to provide you with the care you need, when you need it.  We recommend signing up for the patient portal called "MyChart".  Sign up information is provided on this After Visit Summary.  MyChart is used to connect with patients for Virtual Visits (Telemedicine).  Patients are able to view lab/test results, encounter notes, upcoming appointments, etc.  Non-urgent messages can be sent to your provider as well.   To learn more about what you can do with MyChart, go to [email or web address].    Your next appointment:   3 month(s)  The format for your next appointment:   In Person  Provider:   Gypsy Balsam, MD   Other Instructions

## 2021-08-08 NOTE — Progress Notes (Signed)
Cardiology Office Note:    Date:  08/08/2021   ID:  Bradley Jefferson, DOB [birth year], MRN [id number]  PCP:  Wanda Plump, MD  Cardiologist:  Gypsy Balsam, MD    Referring MD: Wanda Plump, MD   Chief Complaint  Patient presents with   Medication Problem    D/C Lipitor due to muscle aches     History of Present Illness:    Bradley Jefferson is a 50 y.o. male with past medical history significant for paroxysmal atrial fibrillation, that is successfully suppressed with flecainide 50 mg twice daily, he is anticoagulated because of his CHADS2 Vascor equals 2 for hypertension and diabetes.  He is coming today 2 months for follow-up.  Overall he is doing very well to issue that we need to talk about.  Overall no palpitations no dizziness no passing out no shortness of breath no chest pain.  He developed plantar fasciitis in the right foot he thinks is related to cholesterol medication he stopped cholesterol medication and plantar fasciitis seems to be better.  Another concern he has he used CGM to monitor his sugar however now when he takes Xarelto anytime he inserted CGM there is a lot of bleeding and CGM typically does not measure accurately.  So he does not use CGM anymore.  Past Medical History:  Diagnosis Date   Annual physical exam 07/22/2011   Diabetes mellitus 03/2009   DM II (diabetes mellitus, type II), controlled (HCC) 03/07/2009   Patient requested to discontinue Actos and metformin and switch to Byetta 08-2010 Metformin restarted 12-2010 but self discontinued due to GI side effects Feet exam normal 11-15 Eye care discussed 10-14    Dyslipidemia 12/27/2019   Dyspnea on exertion 12/27/2019   ETD (eustachian tube dysfunction) 01/21/2012   EXTERNAL HEMORRHOIDS 10/12/2010   Qualifier: Diagnosis of  By: Drue Novel MD, Jose E.    GERD 03/07/2009   Qualifier: Diagnosis of  By: Shary Decamp     GERD (gastroesophageal reflux disease)    Hypertension 07/05/2018   Insomnia 02/23/2020   Low back pain    sees  Chiro-herman   Palpitations 12/27/2019   Paroxysmal atrial fibrillation (HCC) 04/24/2020    Past Surgical History:  Procedure Laterality Date   abscess anal gland     s/p I&D    Current Medications: Current Meds  Medication Sig   cetirizine (ZYRTEC) 10 MG tablet Take 10 mg by mouth daily.   Continuous Blood Gluc Receiver (FREESTYLE LIBRE 14 DAY READER) DEVI 1 Device by Does not apply route as directed. (Patient taking differently: 1 Device by Does not apply route as directed. Glucose)   Continuous Blood Gluc Sensor (FREESTYLE LIBRE 14 DAY SENSOR) MISC CHECK BLOOD SUGARS AS DIRECTED. (Patient taking differently: 1 each by Other route See admin instructions. Check blood sugars as directed)   diltiazem (CARDIZEM CD) 120 MG 24 hr capsule TAKE 1 CAPSULE BY MOUTH EVERY DAY (Patient taking differently: Take 120 mg by mouth daily.)   flecainide (TAMBOCOR) 50 MG tablet TAKE 1 TABLET BY MOUTH TWICE A DAY (Patient taking differently: Take 50 mg by mouth 2 (two) times daily.)   fluticasone (FLONASE) 50 MCG/ACT nasal spray Place 1 spray into both nostrils as needed for allergies or rhinitis. OTC   glimepiride (AMARYL) 2 MG tablet Take 1 tablet (2 mg total) by mouth daily with breakfast.   pantoprazole (PROTONIX) 40 MG tablet TAKE 1 TABLET BY MOUTH DAILY BEFORE BREAKFAST (Patient taking differently: Take 40 mg by mouth  2 (two) times daily before a meal.)   rivaroxaban (XARELTO) 20 MG TABS tablet TAKE 1 TABLET BY MOUTH DAILY WITH SUPPER. (Patient taking differently: Take 20 mg by mouth daily with supper.)   sitaGLIPtin (JANUVIA) 100 MG tablet TAKE 1 TABLET BY MOUTH EVERY DAY (Patient taking differently: Take 100 mg by mouth daily.)   telmisartan (MICARDIS) 80 MG tablet Take 1 tablet (80 mg total) by mouth daily.     Allergies:   Patient has no known allergies.   Social History   Socioeconomic History   Marital status: Married    Spouse name: Not on file   Number of children: 0   Years of education:  Not on file   Highest education level: Not on file  Occupational History   Occupation: manage a car parts department    Employer: [employer]  Tobacco Use   Smoking status: Former    Types: Cigarettes    Quit date: 07/22/1999    Years since quitting: 22.0   Smokeless tobacco: Never  Substance and Sexual Activity   Alcohol use: Yes    Comment: socially    Drug use: No   Sexual activity: Not on file  Other Topics Concern   Not on file  Social History Narrative   Lives w/ wife, she has h/o breast ca, also dx w/ H. Lymphoma ~ 03-2016, s/p chemo    Social Determinants of Health   Financial Resource Strain: Not on file  Food Insecurity: Not on file  Transportation Needs: Not on file  Physical Activity: Not on file  Stress: Not on file  Social Connections: Not on file     Family History: The patient's family history includes CAD (age of onset: 17) in his father; Colon polyps (age of onset: 69) in his mother; Diabetes in his father; Hyperlipidemia in his father; Stroke (age of onset: 82) in his mother. There is no history of Colon cancer or Prostate cancer. ROS:   Please see the history of present illness.    All 14 point review of systems negative except as described per history of present illness  EKGs/Labs/Other Studies Reviewed:      Recent Labs: 01/09/2021: ALT 18; BUN 15; Creatinine, Ser 1.03; Hemoglobin 15.0; Platelets 174.0; Potassium 4.5; Sodium 139; TSH 1.58  Recent Lipid Panel    Component Value Date/Time   CHOL 134 01/09/2021 1151   CHOL 178 08/01/2020 1016   TRIG 121.0 01/09/2021 1151   HDL 45.20 01/09/2021 1151   HDL 41 08/01/2020 1016   CHOLHDL 3 01/09/2021 1151   VLDL 24.2 01/09/2021 1151   LDLCALC 65 01/09/2021 1151   LDLCALC 121 (H) 08/01/2020 1016   LDLCALC 119 (H) 12/03/2019 1607   LDLDIRECT 106.0 09/29/2018 0909    Physical Exam:    VS:  BP 128/90 (BP Location: Right Arm, Patient Position: Sitting)   Pulse 63   Ht 5\' 11"  (1.803 m)   Wt  206 lb (93.4 kg)   SpO2 97%   BMI 28.73 kg/m     Wt Readings from Last 3 Encounters:  08/08/21 206 lb (93.4 kg)  07/27/21 205 lb 12.8 oz (93.4 kg)  01/09/21 199 lb (90.3 kg)     GEN:  Well nourished, well developed in no acute distress HEENT: Normal NECK: No JVD; No carotid bruits LYMPHATICS: No lymphadenopathy CARDIAC: RRR, no murmurs, no rubs, no gallops RESPIRATORY:  Clear to auscultation without rales, wheezing or rhonchi  ABDOMEN: Soft, non-tender, non-distended MUSCULOSKELETAL:  No edema;  No deformity  SKIN: Warm and dry LOWER EXTREMITIES: no swelling NEUROLOGIC:  Alert and oriented x 3 PSYCHIATRIC:  Normal affect   ASSESSMENT:    1. Paroxysmal atrial fibrillation (HCC)   2. Controlled type 2 diabetes mellitus without complication, without long-term current use of insulin (HCC)   3. Dyslipidemia   4. Primary hypertension    PLAN:    In order of problems listed above:  Paroxysmal atrial fibrillation.  He is maintaining sinus rhythm.  We will continue anticoagulation we will continue flecainide QT interval is normal on EKG. Type 2 diabetes.  He does not wear CGM I told him he can hold Xarelto for 1 day at the time of insertion of CGM and continue wearing CGM. Dyslipidemia he was on atorvastatin however he discontinued that medication because of plantar fasciitis.  I told him this is unlikely that Lipitor caused plantar fasciitis I will recheck his cholesterol and because of his diabetes he need to be on moderate intensity statin.  At that time probably we cannot do 10 mg of Crestor. Essential hypertension blood pressure seems to be well controlled continue present management.   Medication Adjustments/Labs and Tests Ordered: Current medicines are reviewed at length with the patient today.  Concerns regarding medicines are outlined above.  No orders of the defined types were placed in this encounter.  Medication changes: No orders of the defined types were placed in  this encounter.   Signed, Georgeanna Lea, MD, [facility] 08/08/2021 8:19 AM    [facility]

## 2021-08-14 ENCOUNTER — Other Ambulatory Visit: Payer: Self-pay | Admitting: Internal Medicine

## 2021-08-21 ENCOUNTER — Other Ambulatory Visit: Payer: Self-pay | Admitting: Family Medicine

## 2021-08-21 MED ORDER — FLUTICASONE PROPIONATE HFA 110 MCG/ACT IN AERO: 2.0000 | INHALATION_SPRAY | Freq: Two times a day (BID) | RESPIRATORY_TRACT | 0 refills | Status: DC

## 2021-09-18 ENCOUNTER — Other Ambulatory Visit: Payer: Self-pay | Admitting: Family Medicine

## 2021-09-18 ENCOUNTER — Other Ambulatory Visit: Payer: Self-pay | Admitting: Internal Medicine

## 2021-10-13 ENCOUNTER — Other Ambulatory Visit: Payer: Self-pay | Admitting: Internal Medicine

## 2021-10-16 ENCOUNTER — Encounter: Payer: Self-pay | Admitting: Internal Medicine

## 2021-10-16 ENCOUNTER — Ambulatory Visit: Payer: No Typology Code available for payment source | Admitting: Internal Medicine

## 2021-10-16 VITALS — BP 116/72 | HR 64 | Temp 98.2°F | Resp 16 | Ht 71.0 in | Wt 214.2 lb

## 2021-10-16 DIAGNOSIS — Z23 Encounter for immunization: Secondary | ICD-10-CM

## 2021-10-16 DIAGNOSIS — E785 Hyperlipidemia, unspecified: Secondary | ICD-10-CM | POA: Diagnosis not present

## 2021-10-16 DIAGNOSIS — I480 Paroxysmal atrial fibrillation: Secondary | ICD-10-CM | POA: Diagnosis not present

## 2021-10-16 DIAGNOSIS — I10 Essential (primary) hypertension: Secondary | ICD-10-CM | POA: Diagnosis not present

## 2021-10-16 DIAGNOSIS — E119 Type 2 diabetes mellitus without complications: Secondary | ICD-10-CM | POA: Diagnosis not present

## 2021-10-16 LAB — HEMOGLOBIN A1C: Hgb A1c MFr Bld: 8.2 % — ABNORMAL HIGH (ref 4.6–6.5)

## 2021-10-16 LAB — LDL CHOLESTEROL, DIRECT: Direct LDL: 118 mg/dL

## 2021-10-16 LAB — MICROALBUMIN / CREATININE URINE RATIO
Creatinine,U: 23.5 mg/dL
Microalb Creat Ratio: 3 mg/g (ref 0.0–30.0)
Microalb, Ur: <0.7 mg/dL (ref 0.0–1.9)

## 2021-10-16 LAB — LIPID PANEL
Cholesterol: 206 mg/dL — ABNORMAL HIGH (ref 0–200)
HDL: 39.4 mg/dL (ref >39.00)
NonHDL: 167.06
Total CHOL/HDL Ratio: 5
Triglycerides: 289 mg/dL — ABNORMAL HIGH (ref 0.0–149.0)
VLDL: 57.8 mg/dL — ABNORMAL HIGH (ref 0.0–40.0)

## 2021-10-16 LAB — COMPREHENSIVE METABOLIC PANEL
ALT: 20 U/L (ref 0–53)
AST: 14 U/L (ref 0–37)
Albumin: 4.8 g/dL (ref 3.5–5.2)
Alkaline Phosphatase: 47 U/L (ref 39–117)
BUN: 15 mg/dL (ref 6–23)
CO2: 30 mEq/L (ref 19–32)
Calcium: 9.7 mg/dL (ref 8.4–10.5)
Chloride: 98 mEq/L (ref 96–112)
Creatinine, Ser: 0.98 mg/dL (ref 0.40–1.50)
GFR: 90.6 mL/min (ref >60.00)
Glucose, Bld: 210 mg/dL — ABNORMAL HIGH (ref 70–99)
Potassium: 4.4 mEq/L (ref 3.5–5.1)
Sodium: 134 mEq/L — ABNORMAL LOW (ref 135–145)
Total Bilirubin: 0.9 mg/dL (ref 0.2–1.2)
Total Protein: 7.1 g/dL (ref 6.0–8.3)

## 2021-10-16 NOTE — Assessment & Plan Note (Signed)
HTN: Seems well controlled on Cardizem, Micardis, check a CMP DM: On glimepiride, ran out of Januvia 2 weeks ago and plans to restart ASAP. Reports poor diet lately, has a CGM, TIR currently 38% however when he was eating healthy it was 90%. Plan: Labs, restart Januvia, watch diet closely which he plans to do. Further advise w/ results Feet exam negative. High cholesterol: Patient self atorva d/c d/t leg pain consistently.  Explained the benefits of statins in patients with diabetes.  Recheck FLP, consider low-dose Crestor versus Pravachol. Atrial fibrillation Saw cardiology 08/08/2021: Noted to be in sinus rhythm, continue anticoagulation and flecainide. Preventive care: Flu PNM 23 shots today. COVID-vaccine recommended RTC scheduled for April CPX

## 2021-10-16 NOTE — Progress Notes (Signed)
Subjective:    Patient ID: Bradley Jefferson, male    DOB: 03/18/1971, 51 y.o.   MRN: [id number]  DOS:  10/16/2021 Type of visit - description: Follow-up  Today with talk about diabetes, high cholesterol. Was seen by cardiology for A. fib, note reviewed. In general he feels well. Denies chest pain or difficulty breathing.  No palpitations. He did report a lot of stress due to family issues, has not been eating well. Fortunately, things are more calm at home at this point   Review of Systems See above   Past Medical History:  Diagnosis Date   Annual physical exam 07/22/2011   Diabetes mellitus 03/2009   DM II (diabetes mellitus, type II), controlled ([place]) 03/07/2009   Patient requested to discontinue Actos and metformin and switch to Byetta 08-2010 Metformin restarted 12-2010 but self discontinued due to GI side effects Feet exam normal 11-15 Eye care discussed 10-14    Dyslipidemia 12/27/2019   Dyspnea on exertion 12/27/2019   ETD (eustachian tube dysfunction) 01/21/2012   EXTERNAL HEMORRHOIDS 10/12/2010   Qualifier: Diagnosis of  By: Larose Kells MD, Julio Storr E.    GERD 03/07/2009   Qualifier: Diagnosis of  By: Dawson Bills     GERD (gastroesophageal reflux disease)    Hypertension 07/05/2018   Insomnia 02/23/2020   Low back pain    sees Chiro-herman   Palpitations 12/27/2019   Paroxysmal atrial fibrillation ([place]) 04/24/2020    Past Surgical History:  Procedure Laterality Date   abscess anal gland     s/p I&D    Current Outpatient Medications  Medication Instructions   cetirizine (ZYRTEC) 10 mg, Oral, Daily   Continuous Blood Gluc Receiver (FREESTYLE LIBRE 14 DAY READER) DEVI 1 Device, Does not apply, As directed   Continuous Blood Gluc Sensor (FREESTYLE LIBRE 14 DAY SENSOR) MISC CHECK BLOOD SUGARS AS DIRECTED.   diltiazem (CARDIZEM CD) 120 MG 24 hr capsule TAKE 1 CAPSULE BY MOUTH EVERY DAY   flecainide (TAMBOCOR) 50 MG tablet TAKE 1 TABLET BY MOUTH TWICE A DAY   FLOVENT HFA 110 MCG/ACT  inhaler INHALE 2 PUFFS INTO THE LUNGS IN THE MORNING AND AT BEDTIME. RINSE MOUTH OUT AFTER USE.   fluticasone (FLONASE) 50 MCG/ACT nasal spray 1 spray, Each Nare, As needed, OTC    glimepiride (AMARYL) 2 mg, Oral, Daily with breakfast   pantoprazole (PROTONIX) 40 MG tablet TAKE 1 TABLET BY MOUTH DAILY BEFORE BREAKFAST   rivaroxaban (XARELTO) 20 MG TABS tablet TAKE 1 TABLET BY MOUTH DAILY WITH SUPPER.   sitaGLIPtin (JANUVIA) 100 MG tablet TAKE 1 TABLET (100 MG TOTAL) BY MOUTH DAILY. NEEDS OFFICE VISIT   telmisartan (MICARDIS) 80 MG tablet TAKE 1 TABLET BY MOUTH EVERY DAY       Objective:   Physical Exam BP 116/72 (BP Location: Left Arm, Patient Position: Sitting, Cuff Size: Normal)    Pulse 64    Temp 98.2 F (36.8 C) (Oral)    Resp 16    Ht 5\' 11"  (1.803 m)    Wt 214 lb 4 oz (97.2 kg)    SpO2 97%    BMI 29.88 kg/m  General:   Well developed, NAD, BMI noted. HEENT:  Normocephalic . Face symmetric, atraumatic Lungs:  CTA B Normal respiratory effort, no intercostal retractions, no accessory muscle use. Heart: RRR,  no murmur.  DM foot exam: No edema, good pedal pulses, pinprick examination normal Skin: Not pale. Not jaundice Neurologic:  alert & oriented X3.  Speech normal, gait appropriate  for age and unassisted Psych--  Cognition and judgment appear intact.  Cooperative with normal attention span and concentration.  Behavior appropriate. No anxious or depressed appearing.      Assessment      Assessment   DM 2010, no neuropathy Insomnia : xanax rarely  HTN dx 2-17 GERD Occasional low back pain, sees a chiropractor. Occasional increase LFTs: Hepatitis B and C negative 12-2016 Ear eczema: Occasionally uses triamcinolone. COVID-25 September 2019 Atrial fibrillation, W/U initially neg, subsequently A. fib noted on his wrist watch, DX 04-2020  PLAN HTN: Seems well controlled on Cardizem, Micardis, check a CMP DM: On glimepiride, ran out of Januvia 2 weeks ago and plans to  restart ASAP. Reports poor diet lately, has a CGM, TIR currently 38% however when he was eating healthy it was 90%. Plan: Labs, restart Januvia, watch diet closely which he plans to do. Further advise w/ results Feet exam negative. High cholesterol: Patient self atorva d/c d/t leg pain consistently.  Explained the benefits of statins in patients with diabetes.  Recheck FLP, consider low-dose Crestor versus Pravachol. Atrial fibrillation Saw cardiology 08/08/2021: Noted to be in sinus rhythm, continue anticoagulation and flecainide. Preventive care: Flu PNM 23 shots today. COVID-vaccine recommended RTC scheduled for April CPX   This visit occurred during the SARS-CoV-2 public health emergency.  Safety protocols were in place, including screening questions prior to the visit, additional usage of staff PPE, and extensive cleaning of exam room while observing appropriate contact time as indicated for disinfecting solutions.

## 2021-10-16 NOTE — Patient Instructions (Addendum)
Per our records you are due for your diabetic eye exam. Please contact your eye doctor to schedule an appointment. Please have them send copies of your office visit notes to [place]. Our fax number is [phone]. If you need a referral to an eye doctor please let us know.  Proceed with your blood work.   Please consider COVID-vaccine  Check the  blood pressure regularly BP GOAL is between 110/65 and  135/85. If it is consistently higher or lower, let me know  Take all  medications as prescribed.  Call your pharmacy if you need a refill.  See you in April for your physical exam

## 2021-10-18 ENCOUNTER — Other Ambulatory Visit: Payer: Self-pay | Admitting: Internal Medicine

## 2021-10-18 ENCOUNTER — Telehealth: Payer: Self-pay

## 2021-10-18 MED ORDER — GLIMEPIRIDE 2 MG PO TABS: 2.0000 mg | ORAL_TABLET | Freq: Every day | ORAL | 1 refills | Status: DC

## 2021-10-18 MED ORDER — TELMISARTAN 80 MG PO TABS: 80.0000 mg | ORAL_TABLET | Freq: Every day | ORAL | 1 refills | Status: DC

## 2021-10-18 MED ORDER — PANTOPRAZOLE SODIUM 40 MG PO TBEC: 40.0000 mg | DELAYED_RELEASE_TABLET | Freq: Two times a day (BID) | ORAL | 1 refills | Status: DC

## 2021-10-18 MED ORDER — SITAGLIPTIN PHOSPHATE 100 MG PO TABS: 100.0000 mg | ORAL_TABLET | Freq: Every day | ORAL | 1 refills | Status: DC

## 2021-10-18 MED ORDER — ROSUVASTATIN CALCIUM 5 MG PO TABS: 5.0000 mg | ORAL_TABLET | Freq: Every day | ORAL | 0 refills | Status: DC

## 2021-10-18 NOTE — Telephone Encounter (Signed)
PA initiated via Covermymeds; KEY: [id number]. Awaiting determination.

## 2021-10-18 NOTE — Addendum Note (Signed)
Addended byDamita Dunnings D on: 10/18/2021 02:52 PM   Modules accepted: Orders

## 2021-10-22 NOTE — Telephone Encounter (Signed)
PA approved. Effective 10/22/2021 to 10/22/2022.

## 2021-11-09 ENCOUNTER — Encounter: Payer: Self-pay | Admitting: Cardiology

## 2021-11-09 ENCOUNTER — Other Ambulatory Visit: Payer: Self-pay

## 2021-11-09 ENCOUNTER — Ambulatory Visit: Payer: No Typology Code available for payment source | Admitting: Cardiology

## 2021-11-09 VITALS — BP 112/76 | HR 62 | Ht 71.0 in | Wt 215.0 lb

## 2021-11-09 DIAGNOSIS — E119 Type 2 diabetes mellitus without complications: Secondary | ICD-10-CM | POA: Diagnosis not present

## 2021-11-09 DIAGNOSIS — I480 Paroxysmal atrial fibrillation: Secondary | ICD-10-CM | POA: Diagnosis not present

## 2021-11-09 DIAGNOSIS — E785 Hyperlipidemia, unspecified: Secondary | ICD-10-CM | POA: Diagnosis not present

## 2021-11-09 DIAGNOSIS — R002 Palpitations: Secondary | ICD-10-CM | POA: Diagnosis not present

## 2021-11-09 NOTE — Progress Notes (Signed)
Cardiology Office Note:    Date:  11/09/2021   ID:  Bradley Jefferson, DOB May 06, 1971, MRN [id number]  PCP:  Wanda Plump, MD  Cardiologist:  Gypsy Balsam, MD    Referring MD: Wanda Plump, MD   Chief Complaint  Patient presents with   Follow-up  Doing well  History of Present Illness:    Bradley Jefferson is a 51 y.o. male with past medical history significant for paroxysmal atrial fibrillation he is anticoagulated because high CHA2DS2-VASc score, he is also on antiarrhythmic therapy in form of flecainide.  He does have diabetes does have dyslipidemia as well as hypertension.  Comes today to my office for follow-up overall doing well he did slack off with some medications but now things are back to normal and he is gradually getting back to his normal regimen sugars getting better.  Denies have any palpitations no chest pain tightness squeezing pressure burning chest  Past Medical History:  Diagnosis Date   Annual physical exam 07/22/2011   Diabetes mellitus 03/2009   DM II (diabetes mellitus, type II), controlled ([facility]) 03/07/2009   Patient requested to discontinue Actos and metformin and switch to Byetta 08-2010 Metformin restarted 12-2010 but self discontinued due to GI side effects Feet exam normal 11-15 Eye care discussed 10-14    Dyslipidemia 12/27/2019   Dyspnea on exertion 12/27/2019   ETD (eustachian tube dysfunction) 01/21/2012   EXTERNAL HEMORRHOIDS 10/12/2010   Qualifier: Diagnosis of  By: Drue Novel MD, Jose E.    GERD 03/07/2009   Qualifier: Diagnosis of  By: Shary Decamp     GERD (gastroesophageal reflux disease)    Hypertension 07/05/2018   Insomnia 02/23/2020   Low back pain    sees Chiro-herman   Palpitations 12/27/2019   Paroxysmal atrial fibrillation ([facility]) 04/24/2020    Past Surgical History:  Procedure Laterality Date   abscess anal gland     s/p I&D    Current Medications: Current Meds  Medication Sig   cetirizine (ZYRTEC) 10 MG tablet Take 10 mg by mouth daily.    Continuous Blood Gluc Receiver (FREESTYLE LIBRE 14 DAY READER) DEVI 1 Device by Does not apply route as directed. (Patient taking differently: 1 Device by Does not apply route as directed. Glucose)   Continuous Blood Gluc Sensor (FREESTYLE LIBRE 14 DAY SENSOR) MISC CHECK BLOOD SUGARS AS DIRECTED. (Patient taking differently: 1 each by Other route See admin instructions. Check blood sugars as directed)   diltiazem (CARDIZEM CD) 120 MG 24 hr capsule TAKE 1 CAPSULE BY MOUTH EVERY DAY (Patient taking differently: Take 120 mg by mouth daily.)   flecainide (TAMBOCOR) 50 MG tablet TAKE 1 TABLET BY MOUTH TWICE A DAY (Patient taking differently: Take 50 mg by mouth 2 (two) times daily.)   FLOVENT HFA 110 MCG/ACT inhaler INHALE 2 PUFFS INTO THE LUNGS IN THE MORNING AND AT BEDTIME. RINSE MOUTH OUT AFTER USE. (Patient taking differently: Inhale 2 puffs into the lungs 2 (two) times daily.)   fluticasone (FLONASE) 50 MCG/ACT nasal spray Place 1 spray into both nostrils as needed for allergies or rhinitis. OTC   glimepiride (AMARYL) 2 MG tablet Take 1 tablet (2 mg total) by mouth daily with breakfast.   pantoprazole (PROTONIX) 40 MG tablet Take 1 tablet (40 mg total) by mouth 2 (two) times daily before a meal.   rivaroxaban (XARELTO) 20 MG TABS tablet TAKE 1 TABLET BY MOUTH DAILY WITH SUPPER. (Patient taking differently: Take 20 mg by mouth daily with supper.)  rosuvastatin (CRESTOR) 5 MG tablet Take 1 tablet (5 mg total) by mouth at bedtime.   sitaGLIPtin (JANUVIA) 100 MG tablet Take 1 tablet (100 mg total) by mouth daily.   telmisartan (MICARDIS) 80 MG tablet Take 1 tablet (80 mg total) by mouth daily.     Allergies:   Patient has no known allergies.   Social History   Socioeconomic History   Marital status: Married    Spouse name: Not on file   Number of children: 0   Years of education: Not on file   Highest education level: Not on file  Occupational History   Occupation: manage a car parts department     Employer: [employer]  Tobacco Use   Smoking status: Former    Types: Cigarettes    Quit date: 07/22/1999    Years since quitting: 22.3   Smokeless tobacco: Never  Substance and Sexual Activity   Alcohol use: Yes    Comment: socially    Drug use: No   Sexual activity: Not on file  Other Topics Concern   Not on file  Social History Narrative   Lives w/ wife, she has h/o breast ca, also dx w/ H. Lymphoma ~ 03-2016, s/p chemo    Social Determinants of Health   Financial Resource Strain: Not on file  Food Insecurity: Not on file  Transportation Needs: Not on file  Physical Activity: Not on file  Stress: Not on file  Social Connections: Not on file     Family History: The patient's family history includes CAD (age of onset: 5480) in his father; Colon polyps (age of onset: 3550) in his mother; Diabetes in his father; Hyperlipidemia in his father; Stroke (age of onset: 5980) in his mother. There is no history of Colon cancer or Prostate cancer. ROS:   Please see the history of present illness.    All 14 point review of systems negative except as described per history of present illness  EKGs/Labs/Other Studies Reviewed:      Recent Labs: 01/09/2021: Hemoglobin 15.0; Platelets 174.0; TSH 1.58 10/16/2021: ALT 20; BUN 15; Creatinine, Ser 0.98; Potassium 4.4; Sodium 134  Recent Lipid Panel    Component Value Date/Time   CHOL 206 (H) 10/16/2021 1135   CHOL 178 08/01/2020 1016   TRIG 289.0 (H) 10/16/2021 1135   HDL 39.40 10/16/2021 1135   HDL 41 08/01/2020 1016   CHOLHDL 5 10/16/2021 1135   VLDL 57.8 (H) 10/16/2021 1135   LDLCALC 65 01/09/2021 1151   LDLCALC 121 (H) 08/01/2020 1016   LDLCALC 119 (H) 12/03/2019 1607   LDLDIRECT 118.0 10/16/2021 1135    Physical Exam:    VS:  BP 112/76 (BP Location: Left Arm, Patient Position: Sitting)    Pulse 62    Ht 5\' 11"  (1.803 m)    Wt 215 lb (97.5 kg)    SpO2 95%    BMI 29.99 kg/m     Wt Readings from Last 3 Encounters:   11/09/21 215 lb (97.5 kg)  10/16/21 214 lb 4 oz (97.2 kg)  08/08/21 206 lb (93.4 kg)     GEN:  Well nourished, well developed in no acute distress HEENT: Normal NECK: No JVD; No carotid bruits LYMPHATICS: No lymphadenopathy CARDIAC: RRR, no murmurs, no rubs, no gallops RESPIRATORY:  Clear to auscultation without rales, wheezing or rhonchi  ABDOMEN: Soft, non-tender, non-distended MUSCULOSKELETAL:  No edema; No deformity  SKIN: Warm and dry LOWER EXTREMITIES: no swelling NEUROLOGIC:  Alert and oriented x  3 PSYCHIATRIC:  Normal affect   ASSESSMENT:    1. Paroxysmal atrial fibrillation (HCC)   2. Controlled type 2 diabetes mellitus without complication, without long-term current use of insulin ([facility])   3. Palpitations   4. Dyslipidemia    PLAN:    In order of problems listed above:  Paroxysmal atrial fibrillation.  Denies have any palpitations continue present medications. Type 2 diabetes.  Apparently likely not well controlled he used to wear CGM but forgot time he did not now he is back we did start getting better control of his diabetes. Dyslipidemia he had difficulty tolerating atorvastatin.  Now he is on rosuvastatin seems to be tolerating this well we will check his fasting lipid profile within the next few weeks.   Medication Adjustments/Labs and Tests Ordered: Current medicines are reviewed at length with the patient today.  Concerns regarding medicines are outlined above.  No orders of the defined types were placed in this encounter.  Medication changes: No orders of the defined types were placed in this encounter.   Signed, Georgeanna Lea, MD, [facility] 11/09/2021 9:22 AM    [facility]

## 2021-11-09 NOTE — Patient Instructions (Signed)
Medication Instructions:  Your physician recommends that you continue on your current medications as directed. Please refer to the Current Medication list given to you today.  *If you need a refill on your cardiac medications before your next appointment, please call your pharmacy*   Lab Work: Your physician recommends that you return for lab work in: Lipid profile done in 6 weeks fasting     Testing/Procedures: None   Follow-Up: At [facility], you and your health needs are our priority.  As part of our continuing mission to provide you with exceptional heart care, we have created designated Provider Care Teams.  These Care Teams include your primary Cardiologist (physician) and Advanced Practice Providers (APPs -  Physician Assistants and Nurse Practitioners) who all work together to provide you with the care you need, when you need it.  We recommend signing up for the patient portal called "MyChart".  Sign up information is provided on this After Visit Summary.  MyChart is used to connect with patients for Virtual Visits (Telemedicine).  Patients are able to view lab/test results, encounter notes, upcoming appointments, etc.  Non-urgent messages can be sent to your provider as well.   To learn more about what you can do with MyChart, go to [email or web address].    Your next appointment:   6 month(s)  The format for your next appointment:   In Person  Provider:   Gypsy Balsam, MD    Other Instructions

## 2021-11-16 ENCOUNTER — Other Ambulatory Visit: Payer: Self-pay | Admitting: Family Medicine

## 2021-11-21 ENCOUNTER — Encounter: Payer: Self-pay | Admitting: Internal Medicine

## 2021-12-10 ENCOUNTER — Encounter: Payer: Self-pay | Admitting: Internal Medicine

## 2021-12-10 MED ORDER — METFORMIN HCL ER 500 MG PO TB24: 1000.0000 mg | ORAL_TABLET | Freq: Every day | ORAL | 6 refills | Status: DC

## 2022-01-09 ENCOUNTER — Other Ambulatory Visit: Payer: Self-pay | Admitting: Internal Medicine

## 2022-01-14 ENCOUNTER — Other Ambulatory Visit: Payer: Self-pay | Admitting: Cardiology

## 2022-01-15 ENCOUNTER — Other Ambulatory Visit: Payer: Self-pay | Admitting: Cardiology

## 2022-01-15 ENCOUNTER — Other Ambulatory Visit: Payer: Self-pay | Admitting: Internal Medicine

## 2022-01-17 ENCOUNTER — Encounter: Payer: Self-pay | Admitting: Internal Medicine

## 2022-01-17 ENCOUNTER — Ambulatory Visit (INDEPENDENT_AMBULATORY_CARE_PROVIDER_SITE_OTHER): Payer: No Typology Code available for payment source | Admitting: Internal Medicine

## 2022-01-17 VITALS — BP 132/80 | HR 63 | Temp 97.9°F | Resp 16 | Ht 71.0 in | Wt 215.2 lb

## 2022-01-17 DIAGNOSIS — E785 Hyperlipidemia, unspecified: Secondary | ICD-10-CM | POA: Diagnosis not present

## 2022-01-17 DIAGNOSIS — Z125 Encounter for screening for malignant neoplasm of prostate: Secondary | ICD-10-CM

## 2022-01-17 DIAGNOSIS — Z0000 Encounter for general adult medical examination without abnormal findings: Secondary | ICD-10-CM | POA: Diagnosis not present

## 2022-01-17 DIAGNOSIS — I10 Essential (primary) hypertension: Secondary | ICD-10-CM

## 2022-01-17 DIAGNOSIS — E119 Type 2 diabetes mellitus without complications: Secondary | ICD-10-CM | POA: Diagnosis not present

## 2022-01-17 LAB — CBC WITH DIFFERENTIAL/PLATELET
Basophils Absolute: 0 10*3/uL (ref 0.0–0.1)
Basophils Relative: 0.7 % (ref 0.0–3.0)
Eosinophils Absolute: 0.2 10*3/uL (ref 0.0–0.7)
Eosinophils Relative: 2.2 % (ref 0.0–5.0)
HCT: 42.6 % (ref 39.0–52.0)
Hemoglobin: 14.3 g/dL (ref 13.0–17.0)
Lymphocytes Relative: 20.2 % (ref 12.0–46.0)
Lymphs Abs: 1.4 10*3/uL (ref 0.7–4.0)
MCHC: 33.4 g/dL (ref 30.0–36.0)
MCV: 90.7 fl (ref 78.0–100.0)
Monocytes Absolute: 0.5 10*3/uL (ref 0.1–1.0)
Monocytes Relative: 7.2 % (ref 3.0–12.0)
Neutro Abs: 4.8 10*3/uL (ref 1.4–7.7)
Neutrophils Relative %: 69.7 % (ref 43.0–77.0)
Platelets: 185 10*3/uL (ref 150.0–400.0)
RBC: 4.7 Mil/uL (ref 4.22–5.81)
RDW: 12.8 % (ref 11.5–15.5)
WBC: 6.9 10*3/uL (ref 4.0–10.5)

## 2022-01-17 LAB — COMPREHENSIVE METABOLIC PANEL
ALT: 22 U/L (ref 0–53)
AST: 16 U/L (ref 0–37)
Albumin: 5 g/dL (ref 3.5–5.2)
Alkaline Phosphatase: 38 U/L — ABNORMAL LOW (ref 39–117)
BUN: 14 mg/dL (ref 6–23)
CO2: 29 mEq/L (ref 19–32)
Calcium: 9.9 mg/dL (ref 8.4–10.5)
Chloride: 101 mEq/L (ref 96–112)
Creatinine, Ser: 1.01 mg/dL (ref 0.40–1.50)
GFR: 87.22 mL/min (ref >60.00)
Glucose, Bld: 119 mg/dL — ABNORMAL HIGH (ref 70–99)
Potassium: 4.7 mEq/L (ref 3.5–5.1)
Sodium: 138 mEq/L (ref 135–145)
Total Bilirubin: 0.6 mg/dL (ref 0.2–1.2)
Total Protein: 7.2 g/dL (ref 6.0–8.3)

## 2022-01-17 LAB — LIPID PANEL
Cholesterol: 165 mg/dL (ref 0–200)
HDL: 44.4 mg/dL (ref >39.00)
LDL Cholesterol: 84 mg/dL (ref 0–99)
NonHDL: 120.32
Total CHOL/HDL Ratio: 4
Triglycerides: 184 mg/dL — ABNORMAL HIGH (ref 0.0–149.0)
VLDL: 36.8 mg/dL (ref 0.0–40.0)

## 2022-01-17 LAB — PSA: PSA: 0.67 ng/mL (ref 0.10–4.00)

## 2022-01-17 LAB — HEMOGLOBIN A1C: Hgb A1c MFr Bld: 7.5 % — ABNORMAL HIGH (ref 4.6–6.5)

## 2022-01-17 MED ORDER — ALPRAZOLAM 0.5 MG PO TABS
0.5000 mg | ORAL_TABLET | Freq: Every evening | ORAL | 1 refills | Status: DC | PRN
Start: 2022-01-17 — End: 2022-08-26

## 2022-01-17 MED ORDER — ROSUVASTATIN CALCIUM 5 MG PO TABS: 5.0000 mg | ORAL_TABLET | ORAL | Status: DC

## 2022-01-17 NOTE — Assessment & Plan Note (Signed)
Here for CPX ?DM: Last A1c was 8.2, he was on glimepiride and at that time ran out of Januvia (since then refilled). ?Metformin was reintroduced about 6 weeks ago and since then CBGs are much better, " time in range" is 80% of the time.  He is trying to eat healthier.  Checking labs. ?Feet exam wnl. ?High cholesterol: Based on last FLP started Crestor, unable to tolerate qhs, now qod and feels ok   Checking labs ?HTN: Well-controlled, continue same medicines.  Checking labs. ?Atrial fibrillation: Per cardiology ?RTC 4 to 5 months ?

## 2022-01-17 NOTE — Progress Notes (Addendum)
? ?Subjective:  ? ? Patient ID: Bradley Jefferson, male    DOB: [birth year], 51 y.o.   MRN: [id number] ? ?DOS:  01/17/2022 ?Type of visit - description: cpx ? ?Here for CPX. ?Feels well. ?+ stress at work. ?Eating healthy most of the time. ? ?Review of Systems ? ?Other than above, a 14 point review of systems is negative  ? ?  ? ? ?Past Medical History:  ?Diagnosis Date  ? Annual physical exam 07/22/2011  ? Diabetes mellitus 03/2009  ? DM II (diabetes mellitus, type II), controlled ([facility]) 03/07/2009  ? Patient requested to discontinue Actos and metformin and switch to Byetta 08-2010 Metformin restarted 12-2010 but self discontinued due to GI side effects Feet exam normal 11-15 Eye care discussed 10-14   ? Dyslipidemia 12/27/2019  ? Dyspnea on exertion 12/27/2019  ? ETD (eustachian tube dysfunction) 01/21/2012  ? EXTERNAL HEMORRHOIDS 10/12/2010  ? Qualifier: Diagnosis of  By: Drue Novel MD, Nolon Rod.   ? GERD 03/07/2009  ? Qualifier: Diagnosis of  By: Shary Decamp    ? GERD (gastroesophageal reflux disease)   ? Hypertension 07/05/2018  ? Insomnia 02/23/2020  ? Low back pain   ? sees Chiro-herman  ? Palpitations 12/27/2019  ? Paroxysmal atrial fibrillation ([facility]) 04/24/2020  ? ? ?Past Surgical History:  ?Procedure Laterality Date  ? abscess anal gland    ? s/p I&D  ? ?Social History  ? ?Socioeconomic History  ? Marital status: Married  ?  Spouse name: Not on file  ? Number of children: 0  ? Years of education: Not on file  ? Highest education level: Not on file  ?Occupational History  ? Occupation: manage a car parts department  ?  Employer: [employer]  ?Tobacco Use  ? Smoking status: Former  ?  Types: Cigarettes  ?  Quit date: 07/22/1999  ?  Years since quitting: 22.5  ? Smokeless tobacco: Never  ?Substance and Sexual Activity  ? Alcohol use: Yes  ?  Comment: socially   ? Drug use: No  ? Sexual activity: Not on file  ?Other Topics Concern  ? Not on file  ?Social History Narrative  ? Lives w/ wife, she has h/o breast ca, also dx w/ H.  Lymphoma ~ 03-2016, s/p chemo   ? Father and father in law live w/ them  ? ?Social Determinants of Health  ? ?Financial Resource Strain: Not on file  ?Food Insecurity: Not on file  ?Transportation Needs: Not on file  ?Physical Activity: Not on file  ?Stress: Not on file  ?Social Connections: Not on file  ?Intimate Partner Violence: Not on file  ? ? ?Current Outpatient Medications  ?Medication Instructions  ? ALPRAZolam (XANAX) 0.5 mg, Oral, At bedtime PRN  ? cetirizine (ZYRTEC) 10 mg, Oral, Daily  ? Continuous Blood Gluc Receiver (FREESTYLE LIBRE 14 DAY READER) DEVI 1 Device, Does not apply, As directed  ? Continuous Blood Gluc Sensor (FREESTYLE LIBRE 14 DAY SENSOR) MISC CHECK BLOOD SUGARS AS DIRECTED.  ? diltiazem (CARDIZEM CD) 120 mg, Oral, Daily  ? flecainide (TAMBOCOR) 50 MG tablet TAKE 1 TABLET BY MOUTH TWICE A DAY  ? fluticasone (FLONASE) 50 MCG/ACT nasal spray 1 spray, Each Nare, As needed, OTC   ? glimepiride (AMARYL) 2 mg, Oral, Daily with breakfast  ? metFORMIN (GLUCOPHAGE-XR) 1,000 mg, Oral, Daily with breakfast  ? pantoprazole (PROTONIX) 40 MG tablet TAKE 1 TABLET BY MOUTH EVERY DAY BEFORE BREAKFAST  ? rivaroxaban (XARELTO) 20 MG TABS tablet TAKE  1 TABLET BY MOUTH DAILY WITH SUPPER.  ? rosuvastatin (CRESTOR) 5 mg, Oral, Every other day  ? sitaGLIPtin (JANUVIA) 100 mg, Oral, Daily  ? telmisartan (MICARDIS) 80 mg, Oral, Daily  ? ? ?   ?Objective:  ? Physical Exam ?BP 132/80 (BP Location: Right Arm, Patient Position: Sitting, Cuff Size: Normal)   Pulse 63   Temp 97.9 ?F (36.6 ?C) (Oral)   Resp 16   Ht 5\' 11"  (1.803 m)   Wt 215 lb 4 oz (97.6 kg)   SpO2 98%   BMI 30.02 kg/m?  ?General: ?Well developed, NAD, BMI noted ?Neck: No  thyromegaly  ?HEENT:  ?Normocephalic . Face symmetric, atraumatic ?Lungs:  ?CTA B ?Normal respiratory effort, no intercostal retractions, no accessory muscle use. ?Heart: RRR,  no murmur.  ?Abdomen:  ?Not distended, soft, non-tender. No rebound or rigidity.   ?DM foot exam: No  edema, good pedal pulses, pinprick examination normal ?Skin: Exposed areas without rash. Not pale. Not jaundice ?DRE: Normal sphincter tone, brown stools, prostate normal ?Neurologic:  ?alert & oriented X3.  ?Speech normal, gait appropriate for age and unassisted ?Strength symmetric and appropriate for age.  ?Psych: ?Cognition and judgment appear intact.  ?Cooperative with normal attention span and concentration.  ?Behavior appropriate. ?No anxious or depressed appearing. ? ?   ?Assessment   ? ? Assessment   ?DM 2010, no neuropathy; can tolerate only metformin ER; Tradjenta worked but not covered by insurance  ?HTN dx 2-17 ?High cholesterol. (Atorva- aches) ?Insomnia : xanax rarely  ?GERD ?Occasional low back pain, sees a 2011. ?Occasional increase LFTs: Hepatitis B and C negative 12-2016 ?Ear eczema: Occasionally uses triamcinolone. ?COVID-25 September 2019 ?Atrial fibrillation, W/U initially neg, subsequently A. fib noted on his wrist watch, DX 04-2020 ? ?PLAN ?Here for CPX ?DM: Last A1c was 8.2, he was on glimepiride and at that time ran out of Januvia (since then refilled). ?Metformin was reintroduced about 6 weeks ago and since then CBGs are much better, " time in range" is 80% of the time.  He is trying to eat healthier.  Checking labs. ?Feet exam wnl. ?High cholesterol: Based on last FLP started Crestor, unable to tolerate qhs, now qod and feels ok   Checking labs ?HTN: Well-controlled, continue same medicines.  Checking labs. ?Atrial fibrillation: Per cardiology ?RTC 4 to 5 months ? ? ? ? ?This visit occurred during the SARS-CoV-2 public health emergency.  Safety protocols were in place, including screening questions prior to the visit, additional usage of staff PPE, and extensive cleaning of exam room while observing appropriate contact time as indicated for disinfecting solutions.  ? ?

## 2022-01-17 NOTE — Assessment & Plan Note (Signed)
-  Td 2018 ?- PNM 23:  2014 and 2023; Prevnar 2016 ?-COVID VAX:  booster d/w pt ?- (+) FH CV Dz: plan is control CV RF  ?- CCS: mother had multiple polyps at age 50, [place] [facility] 05/2020, +polyps per pt, x2 tubular adenoma as per path report.  Next per GI ?-Prostate cancer screening: DRE neg, check a  PSA  ?- labs:   CMP, FLP, CBC,  A1c, PSA   ?-Lifestyle: Diet is healthy, no exercise  but he is very active. ? ?  ?

## 2022-01-17 NOTE — Patient Instructions (Signed)
? ?  GO TO THE LAB : Get the blood work   ? ? ?[place], [place] ?Come back for for a checkup in 4 to 5 months. ?

## 2022-01-22 ENCOUNTER — Encounter: Payer: Self-pay | Admitting: Internal Medicine

## 2022-01-22 ENCOUNTER — Other Ambulatory Visit: Payer: Self-pay | Admitting: Internal Medicine

## 2022-01-22 MED ORDER — METFORMIN HCL ER 500 MG PO TB24: 1000.0000 mg | ORAL_TABLET | Freq: Two times a day (BID) | ORAL | 6 refills | Status: DC

## 2022-02-05 ENCOUNTER — Other Ambulatory Visit: Payer: Self-pay | Admitting: Cardiology

## 2022-02-05 DIAGNOSIS — I480 Paroxysmal atrial fibrillation: Secondary | ICD-10-CM

## 2022-02-05 NOTE — Telephone Encounter (Signed)
Prescription refill request for Xarelto received.  ?Indication: Afib  ?Last office visit:11/09/21 Bing Matter)  ?Weight: 97.6kg ?Age: 51 ?Scr:1.01 (01/17/22)  ?CrCl: 122.63ml/min ? ?Appropriate dose and refill sent to requested pharmacy.  ?

## 2022-04-05 ENCOUNTER — Other Ambulatory Visit: Payer: Self-pay | Admitting: Internal Medicine

## 2022-04-12 ENCOUNTER — Other Ambulatory Visit: Payer: Self-pay | Admitting: Internal Medicine

## 2022-05-02 ENCOUNTER — Other Ambulatory Visit: Payer: Self-pay | Admitting: Internal Medicine

## 2022-05-23 ENCOUNTER — Encounter: Payer: Self-pay | Admitting: Cardiology

## 2022-05-23 ENCOUNTER — Ambulatory Visit: Payer: No Typology Code available for payment source | Admitting: Internal Medicine

## 2022-05-23 ENCOUNTER — Encounter: Payer: Self-pay | Admitting: Internal Medicine

## 2022-05-23 ENCOUNTER — Ambulatory Visit: Payer: No Typology Code available for payment source | Admitting: Cardiology

## 2022-05-23 VITALS — BP 120/84 | HR 63 | Ht 71.0 in | Wt 213.0 lb

## 2022-05-23 VITALS — BP 130/82 | HR 58 | Temp 98.3°F | Resp 16 | Ht 71.0 in | Wt 213.0 lb

## 2022-05-23 DIAGNOSIS — Z79899 Other long term (current) drug therapy: Secondary | ICD-10-CM | POA: Diagnosis not present

## 2022-05-23 DIAGNOSIS — E119 Type 2 diabetes mellitus without complications: Secondary | ICD-10-CM

## 2022-05-23 DIAGNOSIS — L309 Dermatitis, unspecified: Secondary | ICD-10-CM

## 2022-05-23 DIAGNOSIS — R002 Palpitations: Secondary | ICD-10-CM | POA: Diagnosis not present

## 2022-05-23 DIAGNOSIS — E785 Hyperlipidemia, unspecified: Secondary | ICD-10-CM

## 2022-05-23 DIAGNOSIS — R0609 Other forms of dyspnea: Secondary | ICD-10-CM

## 2022-05-23 DIAGNOSIS — I480 Paroxysmal atrial fibrillation: Secondary | ICD-10-CM | POA: Diagnosis not present

## 2022-05-23 DIAGNOSIS — G4700 Insomnia, unspecified: Secondary | ICD-10-CM

## 2022-05-23 DIAGNOSIS — I10 Essential (primary) hypertension: Secondary | ICD-10-CM

## 2022-05-23 HISTORY — DX: Dermatitis, unspecified: L30.9

## 2022-05-23 LAB — HEMOGLOBIN A1C: Hgb A1c MFr Bld: 7.3 % — ABNORMAL HIGH (ref 4.6–6.5)

## 2022-05-23 LAB — LIPID PANEL
Cholesterol: 140 mg/dL (ref 0–200)
HDL: 38.4 mg/dL — ABNORMAL LOW (ref >39.00)
NonHDL: 101.33
Total CHOL/HDL Ratio: 4
Triglycerides: 202 mg/dL — ABNORMAL HIGH (ref 0.0–149.0)
VLDL: 40.4 mg/dL — ABNORMAL HIGH (ref 0.0–40.0)

## 2022-05-23 LAB — BASIC METABOLIC PANEL
BUN: 15 mg/dL (ref 6–23)
CO2: 28 mEq/L (ref 19–32)
Calcium: 9.5 mg/dL (ref 8.4–10.5)
Chloride: 103 mEq/L (ref 96–112)
Creatinine, Ser: 1.01 mg/dL (ref 0.40–1.50)
GFR: 87.01 mL/min (ref >60.00)
Glucose, Bld: 130 mg/dL — ABNORMAL HIGH (ref 70–99)
Potassium: 5.1 mEq/L (ref 3.5–5.1)
Sodium: 140 mEq/L (ref 135–145)

## 2022-05-23 LAB — LDL CHOLESTEROL, DIRECT: Direct LDL: 78 mg/dL

## 2022-05-23 MED ORDER — TRIAMCINOLONE ACETONIDE 0.1 % EX LOTN: 1.0000 | TOPICAL_LOTION | Freq: Three times a day (TID) | CUTANEOUS | 2 refills | Status: DC | PRN

## 2022-05-23 NOTE — Progress Notes (Signed)
Subjective:    Patient ID: Bradley Jefferson, male    DOB: 04/08/1971, 51 y.o.   MRN: [id number]  DOS:  05/23/2022 Type of visit - description: Follow-up  Since the last office visit is feeling well. Today we talk about DM, cholesterol, atrial fibrillation.  Notes from cardiology reviewed.  Also has a history of eczema, currently exacerbated on his feet. The rash is on and off,  is not pruritic.   Review of Systems See above   Past Medical History:  Diagnosis Date   Annual physical exam 07/22/2011   Diabetes mellitus 03/2009   DM II (diabetes mellitus, type II), controlled (HCC) 03/07/2009   Patient requested to discontinue Actos and metformin and switch to Byetta 08-2010 Metformin restarted 12-2010 but self discontinued due to GI side effects Feet exam normal 11-15 Eye care discussed 10-14    Dyslipidemia 12/27/2019   Dyspnea on exertion 12/27/2019   ETD (eustachian tube dysfunction) 01/21/2012   EXTERNAL HEMORRHOIDS 10/12/2010   Qualifier: Diagnosis of  By: Drue Novel MD, Shadee Montoya E.    GERD 03/07/2009   Qualifier: Diagnosis of  By: Shary Decamp     GERD (gastroesophageal reflux disease)    Hypertension 07/05/2018   Insomnia 02/23/2020   Low back pain    sees Chiro-herman   Palpitations 12/27/2019   Paroxysmal atrial fibrillation (HCC) 04/24/2020    Past Surgical History:  Procedure Laterality Date   abscess anal gland     s/p I&D    Current Outpatient Medications  Medication Instructions   ALPRAZolam (XANAX) 0.5 mg, Oral, At bedtime PRN   cetirizine (ZYRTEC) 10 mg, Oral, Daily   Continuous Blood Gluc Receiver (FREESTYLE LIBRE 14 DAY READER) DEVI 1 Device, Does not apply, As directed   Continuous Blood Gluc Sensor (FREESTYLE LIBRE 14 DAY SENSOR) MISC CHECK BLOOD SUGARS AS DIRECTED.   diltiazem (CARDIZEM CD) 120 mg, Oral, Daily   flecainide (TAMBOCOR) 50 MG tablet TAKE 1 TABLET BY MOUTH TWICE A DAY   fluticasone (FLONASE) 50 MCG/ACT nasal spray 1 spray, Each Nare, As needed, OTC     glimepiride (AMARYL) 2 MG tablet TAKE 1 TABLET BY MOUTH DAILY WITH BREAKFAST   metFORMIN (GLUCOPHAGE-XR) 1,000 mg, Oral, 2 times daily   pantoprazole (PROTONIX) 40 MG tablet TAKE 1 TABLET BY MOUTH EVERY DAY BEFORE BREAKFAST   rosuvastatin (CRESTOR) 5 MG tablet TAKE 1 TABLET BY MOUTH EVERYDAY AT BEDTIME   sitaGLIPtin (JANUVIA) 100 MG tablet TAKE 1 TABLET BY MOUTH EVERY DAY   telmisartan (MICARDIS) 80 mg, Oral, Daily   triamcinolone lotion (KENALOG) 0.1 % 1 Application, Topical, 3 times daily PRN   XARELTO 20 MG TABS tablet TAKE 1 TABLET BY MOUTH DAILY WITH SUPPER       Objective:   Physical Exam BP 130/82   Pulse (!) 58   Temp 98.3 F (36.8 C) (Oral)   Resp 16   Ht 5\' 11"  (1.803 m)   Wt 213 lb (96.6 kg)   SpO2 98%   BMI 29.71 kg/m  General:   Well developed, NAD, BMI noted. HEENT:  Normocephalic . Face symmetric, atraumatic Lungs:  CTA B Normal respiratory effort, no intercostal retractions, no accessory muscle use. Heart: RRR,  no murmur.  Lower extremities: no pretibial edema bilaterally  Skin: Rash noted at the feet.  See picture Neurologic:  alert & oriented X3.  Speech normal, gait appropriate for age and unassisted Psych--  Cognition and judgment appear intact.  Cooperative with normal attention span and concentration.  Behavior appropriate. No anxious or depressed appearing.       Assessment    Assessment   DM 2010, no neuropathy; can tolerate only metformin ER; Tradjenta worked but not covered by insurance  HTN dx 2-17 High cholesterol. (Atorva- aches) Atrial fibrillation, W/U initially neg, subsequently A. fib noted on his wrist watch, DX 04-2020 Insomnia : xanax rarely  GERD Occasional low back pain, sees a chiropractor. Occasional increase LFTs: Hepatitis B and C negative 12-2016 Ec zema: Occasionally uses triamcinolone.     PLAN DM: Last A1c trending down, it was 7.5. Current meds glimepiride, metformin, Januvia.  Has a CGM, time in range 80%, only  18% of the time is elevated (between 181 and 240).  We will check A1c, stressed need to eat healthy and stay active. HTN.  BP is very good, continue diltiazem, Micardis, checking labs, BMP High cholesterol: Able to tolerate Crestor every other day, LDL goal 70, recheck FLP, consider Zetia. Eczema: On and off rash,see physical exam,  typically increased by stress.  Sent prescription for triamcinolone as needed. Atrial fibrillation: Saw cardiology today, felt to be stable, on flecainide, Diltiazem.  EKG: Sinus rhythm. Anxiety: Xanax, contract UDS today Preventive care: COVID booster and flu shot. RTC 4 months

## 2022-05-23 NOTE — Patient Instructions (Signed)

## 2022-05-23 NOTE — Progress Notes (Signed)
Cardiology Office Note:    Date:  05/23/2022   ID:  Bradley Jefferson, DOB 1970-10-29, MRN [id number]  PCP:  Wanda Plump, MD  Cardiologist:  Gypsy Balsam, MD    Referring MD: Wanda Plump, MD   No chief complaint on file. Doing fine  History of Present Illness:    Bradley Jefferson is a 51 y.o. male with past medical history significant for paroxysmal atrial fibrillation look like successfully suppressed with flecainide 50 mg twice daily, he is anticoagulated because of high CHADS2 Vascor.  Additional problems include diabetes mellitus, essential hypertension, dyslipidemia.  He comes today to my office for follow-up.  Overall doing very well.  Denies have any palpitations no dizziness no passing out.  He does not exercise on the regular basis but his walk work required a lot of walking which he does with no difficulties  Past Medical History:  Diagnosis Date   Annual physical exam 07/22/2011   Diabetes mellitus 03/2009   DM II (diabetes mellitus, type II), controlled (HCC) 03/07/2009   Patient requested to discontinue Actos and metformin and switch to Byetta 08-2010 Metformin restarted 12-2010 but self discontinued due to GI side effects Feet exam normal 11-15 Eye care discussed 10-14    Dyslipidemia 12/27/2019   Dyspnea on exertion 12/27/2019   ETD (eustachian tube dysfunction) 01/21/2012   EXTERNAL HEMORRHOIDS 10/12/2010   Qualifier: Diagnosis of  By: Drue Novel MD, Jose E.    GERD 03/07/2009   Qualifier: Diagnosis of  By: Shary Decamp     GERD (gastroesophageal reflux disease)    Hypertension 07/05/2018   Insomnia 02/23/2020   Low back pain    sees Chiro-herman   Palpitations 12/27/2019   Paroxysmal atrial fibrillation (HCC) 04/24/2020    Past Surgical History:  Procedure Laterality Date   abscess anal gland     s/p I&D    Current Medications: Current Meds  Medication Sig   ALPRAZolam (XANAX) 0.5 MG tablet Take 1 tablet (0.5 mg total) by mouth at bedtime as needed for anxiety.   cetirizine  (ZYRTEC) 10 MG tablet Take 10 mg by mouth daily.   Continuous Blood Gluc Receiver (FREESTYLE LIBRE 14 DAY READER) DEVI 1 Device by Does not apply route as directed. (Patient taking differently: 1 Device by Does not apply route as directed. Glucose)   Continuous Blood Gluc Sensor (FREESTYLE LIBRE 14 DAY SENSOR) MISC CHECK BLOOD SUGARS AS DIRECTED.   diltiazem (CARDIZEM CD) 120 MG 24 hr capsule Take 1 capsule (120 mg total) by mouth daily.   flecainide (TAMBOCOR) 50 MG tablet TAKE 1 TABLET BY MOUTH TWICE A DAY   fluticasone (FLONASE) 50 MCG/ACT nasal spray Place 1 spray into both nostrils as needed for allergies or rhinitis. OTC   glimepiride (AMARYL) 2 MG tablet TAKE 1 TABLET BY MOUTH DAILY WITH BREAKFAST   metFORMIN (GLUCOPHAGE-XR) 500 MG 24 hr tablet Take 2 tablets (1,000 mg total) by mouth 2 (two) times daily.   pantoprazole (PROTONIX) 40 MG tablet TAKE 1 TABLET BY MOUTH EVERY DAY BEFORE BREAKFAST   rosuvastatin (CRESTOR) 5 MG tablet TAKE 1 TABLET BY MOUTH EVERYDAY AT BEDTIME   sitaGLIPtin (JANUVIA) 100 MG tablet TAKE 1 TABLET BY MOUTH EVERY DAY   telmisartan (MICARDIS) 80 MG tablet TAKE 1 TABLET BY MOUTH EVERY DAY   XARELTO 20 MG TABS tablet TAKE 1 TABLET BY MOUTH DAILY WITH SUPPER     Allergies:   Patient has no known allergies.   Social History   Socioeconomic  History   Marital status: Married    Spouse name: Not on file   Number of children: 0   Years of education: Not on file   Highest education level: Not on file  Occupational History   Occupation: manage a car parts department    Employer: [employer]  Tobacco Use   Smoking status: Former    Types: Cigarettes    Quit date: 07/22/1999    Years since quitting: 22.8   Smokeless tobacco: Never  Substance and Sexual Activity   Alcohol use: Yes    Comment: socially    Drug use: No   Sexual activity: Not on file  Other Topics Concern   Not on file  Social History Narrative   Lives w/ wife, she has h/o breast ca,  also dx w/ H. Lymphoma ~ 03-2016, s/p chemo    Father and father in law live w/ them   Social Determinants of Health   Financial Resource Strain: Not on file  Food Insecurity: Not on file  Transportation Needs: Not on file  Physical Activity: Not on file  Stress: Not on file  Social Connections: Not on file     Family History: The patient's family history includes CAD (age of onset: 65) in his father; Colon polyps (age of onset: 68) in his mother; Diabetes in his father; Hyperlipidemia in his father; Stroke (age of onset: 58) in his mother. There is no history of Colon cancer or Prostate cancer. ROS:   Please see the history of present illness.    All 14 point review of systems negative except as described per history of present illness  EKGs/Labs/Other Studies Reviewed:      Recent Labs: 01/17/2022: ALT 22; BUN 14; Creatinine, Ser 1.01; Hemoglobin 14.3; Platelets 185.0; Potassium 4.7; Sodium 138  Recent Lipid Panel    Component Value Date/Time   CHOL 165 01/17/2022 1043   CHOL 178 08/01/2020 1016   TRIG 184.0 (H) 01/17/2022 1043   HDL 44.40 01/17/2022 1043   HDL 41 08/01/2020 1016   CHOLHDL 4 01/17/2022 1043   VLDL 36.8 01/17/2022 1043   LDLCALC 84 01/17/2022 1043   LDLCALC 121 (H) 08/01/2020 1016   LDLCALC 119 (H) 12/03/2019 1607   LDLDIRECT 118.0 10/16/2021 1135    Physical Exam:    VS:  BP 120/84 (BP Location: Right Arm, Patient Position: Sitting)   Pulse 63   Ht 5\' 11"  (1.803 m)   Wt 213 lb (96.6 kg)   SpO2 96%   BMI 29.71 kg/m     Wt Readings from Last 3 Encounters:  05/23/22 213 lb (96.6 kg)  01/17/22 215 lb 4 oz (97.6 kg)  11/09/21 215 lb (97.5 kg)     GEN:  Well nourished, well developed in no acute distress HEENT: Normal NECK: No JVD; No carotid bruits LYMPHATICS: No lymphadenopathy CARDIAC: RRR, no murmurs, no rubs, no gallops RESPIRATORY:  Clear to auscultation without rales, wheezing or rhonchi  ABDOMEN: Soft, non-tender,  non-distended MUSCULOSKELETAL:  No edema; No deformity  SKIN: Warm and dry LOWER EXTREMITIES: no swelling NEUROLOGIC:  Alert and oriented x 3 PSYCHIATRIC:  Normal affect   ASSESSMENT:    1. Paroxysmal atrial fibrillation (HCC)   2. Controlled type 2 diabetes mellitus without complication, without long-term current use of insulin (HCC)   3. Palpitations   4. Dyslipidemia   5. Dyspnea on exertion    PLAN:    In order of problems listed above:  Paroxysmal atrial fibrillation" maintaining sinus  rhythm we will do EKG today make sure QRS complex duration is intact.  We will continue anticoagulation and flecainide 50 twice daily Type II the [place] I do have data from [facility] from 01/17/2022 with hemoglobin A1c of 7.5.  Need to be a little better he understand he is trying to work on that.  He does have CGM, he is scheduled to see his primary care physician today. Palpitations denies having any Dyslipidemia he does have difficulty tolerating statin he is on Crestor 5 every other day I do have his fasting lipid profile from 01/17/2022 showing LDL 84 HDL 44 and wished to have his LDL less than 70.  He is going to primary care physician today to have the test done and then will act accordingly.  He may benefit from addition of Zetia if he is intolerant to higher dosages of statin.   Medication Adjustments/Labs and Tests Ordered: Current medicines are reviewed at length with the patient today.  Concerns regarding medicines are outlined above.  No orders of the defined types were placed in this encounter.  Medication changes: No orders of the defined types were placed in this encounter.   Signed, Georgeanna Lea, MD, [facility] 05/23/2022 8:53 AM    [facility]

## 2022-05-23 NOTE — Assessment & Plan Note (Signed)
DM: Last A1c trending down, it was 7.5. Current meds glimepiride, metformin, Januvia.  Has a CGM, time in range 80%, only 18% of the time is elevated (between 181 and 240).  We will check A1c, stressed need to eat healthy and stay active. HTN.  BP is very good, continue diltiazem, Micardis, checking labs, BMP High cholesterol: Able to tolerate Crestor every other day, LDL goal 70, recheck FLP, consider Zetia. Eczema: On and off rash,see physical exam,  typically increased by stress.  Sent prescription for triamcinolone as needed. Atrial fibrillation: Saw cardiology today, felt to be stable, on flecainide, Diltiazem.  EKG: Sinus rhythm. Anxiety: Xanax, contract UDS today Preventive care: COVID booster and flu shot. RTC 4 months

## 2022-05-23 NOTE — Patient Instructions (Addendum)
Apply the lotion 3 times a day as needed for eczema  Recommend to proceed with the following vaccines at your pharmacy:  Covid booster (bivalent) Flu shot this fall       GO TO THE LAB : Get the blood work     GO TO THE FRONT DESK, PLEASE SCHEDULE YOUR APPOINTMENTS Come back for   checkup in 4 months   Per our records you are due for your diabetic eye exam. Please contact your eye doctor to schedule an appointment. Please have them send copies of your office visit notes to [place]. Our fax number is [phone]. If you need a referral to an eye doctor please let us know. :

## 2022-05-24 MED ORDER — EZETIMIBE 10 MG PO TABS: 10.0000 mg | ORAL_TABLET | Freq: Every day | ORAL | 3 refills | Status: DC

## 2022-05-24 NOTE — Addendum Note (Signed)
Addended byConrad [place] D on: 05/24/2022 08:03 AM   Modules accepted: Orders

## 2022-05-27 LAB — DRUG TOX MONITOR 1 W/CONF, ORAL FLD

## 2022-05-28 ENCOUNTER — Encounter: Payer: Self-pay | Admitting: Internal Medicine

## 2022-05-28 MED ORDER — GLIMEPIRIDE 2 MG PO TABS: 4.0000 mg | ORAL_TABLET | Freq: Every day | ORAL | 1 refills | Status: DC

## 2022-07-08 ENCOUNTER — Other Ambulatory Visit: Payer: Self-pay | Admitting: Cardiology

## 2022-07-08 ENCOUNTER — Other Ambulatory Visit: Payer: Self-pay | Admitting: Internal Medicine

## 2022-07-08 DIAGNOSIS — I480 Paroxysmal atrial fibrillation: Secondary | ICD-10-CM

## 2022-07-08 NOTE — Telephone Encounter (Signed)
Prescription refill request for Xarelto received.  Indication: PAF Last office visit: 05/23/22  Nelta Numbers MD Weight: 96.6kg Age: 51 Scr: 1.01 on 05/23/22 CrCl: 119.55  Based on above findings Xarelto 20mg  daily is the appropriate dose.  Refill approved.

## 2022-07-11 ENCOUNTER — Other Ambulatory Visit: Payer: Self-pay | Admitting: Family Medicine

## 2022-07-29 ENCOUNTER — Other Ambulatory Visit: Payer: Self-pay | Admitting: Internal Medicine

## 2022-07-29 MED ORDER — FREESTYLE LIBRE 14 DAY SENSOR MISC
5 refills | Status: DC
Start: 2022-07-29 — End: 2022-10-14

## 2022-08-26 ENCOUNTER — Encounter: Payer: Self-pay | Admitting: Internal Medicine

## 2022-08-26 ENCOUNTER — Ambulatory Visit (INDEPENDENT_AMBULATORY_CARE_PROVIDER_SITE_OTHER): Payer: BC Managed Care – PPO | Admitting: Internal Medicine

## 2022-08-26 VITALS — BP 134/74 | HR 61 | Temp 98.1°F | Resp 16 | Ht 71.0 in | Wt 216.2 lb

## 2022-08-26 DIAGNOSIS — I10 Essential (primary) hypertension: Secondary | ICD-10-CM

## 2022-08-26 DIAGNOSIS — E785 Hyperlipidemia, unspecified: Secondary | ICD-10-CM | POA: Diagnosis not present

## 2022-08-26 DIAGNOSIS — G4700 Insomnia, unspecified: Secondary | ICD-10-CM

## 2022-08-26 DIAGNOSIS — E119 Type 2 diabetes mellitus without complications: Secondary | ICD-10-CM | POA: Diagnosis not present

## 2022-08-26 LAB — HEMOGLOBIN A1C: Hgb A1c MFr Bld: 8.2 % — ABNORMAL HIGH (ref 4.6–6.5)

## 2022-08-26 MED ORDER — METFORMIN HCL ER 500 MG PO TB24: 1000.0000 mg | ORAL_TABLET | Freq: Two times a day (BID) | ORAL | Status: DC

## 2022-08-26 MED ORDER — ALPRAZOLAM 0.5 MG PO TABS: 0.5000 mg | ORAL_TABLET | Freq: Every evening | ORAL | 1 refills | Status: DC | PRN

## 2022-08-26 MED ORDER — TRIAMCINOLONE ACETONIDE 0.1 % EX CREA: 1.0000 | TOPICAL_CREAM | Freq: Three times a day (TID) | CUTANEOUS | 0 refills | Status: DC | PRN

## 2022-08-26 NOTE — Progress Notes (Unsigned)
Subjective:    Patient ID: Bradley Jefferson, male    DOB: May 14, 1971, 51 y.o.   MRN: [id number]  DOS:  08/26/2022 Type of visit - description: Follow-up  Since the last office visit is doing well. We talk about his chronic medical problems. Today developed left ear pain, no recent URI, ear discharge or bleeding. Adjust dose of Xanax?.  Review of Systems See above   Past Medical History:  Diagnosis Date   Annual physical exam 07/22/2011   Diabetes mellitus 03/2009   DM II (diabetes mellitus, type II), controlled (HCC) 03/07/2009   Patient requested to discontinue Actos and metformin and switch to Byetta 08-2010 Metformin restarted 12-2010 but self discontinued due to GI side effects Feet exam normal 11-15 Eye care discussed 10-14    Dyslipidemia 12/27/2019   Dyspnea on exertion 12/27/2019   ETD (eustachian tube dysfunction) 01/21/2012   EXTERNAL HEMORRHOIDS 10/12/2010   Qualifier: Diagnosis of  By: Drue Novel MD, Tamir Wallman E.    GERD 03/07/2009   Qualifier: Diagnosis of  By: Shary Decamp     GERD (gastroesophageal reflux disease)    Hypertension 07/05/2018   Insomnia 02/23/2020   Low back pain    sees Chiro-herman   Palpitations 12/27/2019   Paroxysmal atrial fibrillation (HCC) 04/24/2020    Past Surgical History:  Procedure Laterality Date   abscess anal gland     s/p I&D    Current Outpatient Medications  Medication Instructions   ALPRAZolam (XANAX) 0.5 mg, Oral, At bedtime PRN   cetirizine (ZYRTEC) 10 mg, Oral, Daily   Continuous Blood Gluc Receiver (FREESTYLE LIBRE 14 DAY READER) DEVI 1 Device, Does not apply, As directed   Continuous Blood Gluc Sensor (FREESTYLE LIBRE 14 DAY SENSOR) MISC Check blood sugars as directed   diltiazem (CARDIZEM CD) 120 mg, Oral, Daily   ezetimibe (ZETIA) 10 mg, Oral, Daily   flecainide (TAMBOCOR) 50 MG tablet TAKE 1 TABLET BY MOUTH TWICE A DAY   fluticasone (FLONASE) 50 MCG/ACT nasal spray 1 spray, Each Nare, As needed, OTC    glimepiride (AMARYL) 4 mg, Oral,  Daily with breakfast   metFORMIN (GLUCOPHAGE-XR) 500 MG 24 hr tablet TAKE 2 TABLETS BY MOUTH EVERY DAY WITH BREAKFAST   pantoprazole (PROTONIX) 40 MG tablet TAKE 1 TABLET BY MOUTH EVERY DAY BEFORE BREAKFAST   rosuvastatin (CRESTOR) 5 MG tablet TAKE 1 TABLET BY MOUTH EVERYDAY AT BEDTIME   sitaGLIPtin (JANUVIA) 100 mg, Oral, Daily   telmisartan (MICARDIS) 80 mg, Oral, Daily   triamcinolone lotion (KENALOG) 0.1 % 1 Application, Topical, 3 times daily PRN   XARELTO 20 MG TABS tablet TAKE 1 TABLET BY MOUTH DAILY WITH SUPPER       Objective:   Physical Exam BP 134/74   Pulse 61   Temp 98.1 F (36.7 C) (Oral)   Resp 16   Ht 5\' 11"  (1.803 m)   Wt 216 lb 4 oz (98.1 kg)   SpO2 97%   BMI 30.16 kg/m  General:   Well developed, NAD, BMI noted. HEENT:  Normocephalic . Face symmetric, atraumatic TMs: Both are bulge, fluid level on the R, TMs are no red.  Canals normal Lungs:  CTA B Normal respiratory effort, no intercostal retractions, no accessory muscle use. Heart: RRR,  no murmur.  Lower extremities: no pretibial edema bilaterally  Skin: Not pale. Not jaundice Neurologic:  alert & oriented X3.  Speech normal, gait appropriate for age and unassisted Psych--  Cognition and judgment appear intact.  Cooperative with normal  attention span and concentration.  Behavior appropriate. No anxious or depressed appearing.      Assessment     Assessment   DM 2010, no neuropathy; can tolerate only metformin ER; Tradjenta worked but not covered by insurance  HTN dx 2-17 High cholesterol. (Atorva- aches) Atrial fibrillation, W/U initially neg, subsequently A. fib noted on his wrist watch, DX 04-2020 Insomnia : xanax rarely  GERD Occasional low back pain, sees a chiropractor. Occasional increase LFTs: Hepatitis B and C negative 12-2016 Ec zema: Occasionally uses triamcinolone.    PLAN DM: Last A1c 7.3, glimepiride increased from 2 mg to 4 mg, he also takes metformin 2 tablets twice daily,  Januvia.  Time in range improved, very rarely has low CBGs.  Plan: Check A1c, further advised w/ results HTN: Reports he checks BP from time to time and readings are usually normal.  Continue Cardizem, Micardis, last potassium borderline elevated, check BMP High cholesterol: Last LDL 118, on Crestor, Zetia added.  Check FLP Serous otitis: Left ear pain today, findings consistent with serous otitis, recommend Flonase Astepro.  See AVS. Insomnia: Sometimes takes 2 Xanax rather than 1, prescription changed to 1 or 2 as needed nightly.  PDMP okay, Rx sent. Preventive care: Encouraged to proceed with a flu shot and advised about COVID-vaccine RTC 01-2019 for CPX

## 2022-08-26 NOTE — Patient Instructions (Addendum)
Vaccines I recommend:  Covid booster Flu shot  Use Flonase daily consistently Use Astepro twice daily consistently If your ears no improving in 2 to 3 weeks let me know.    GO TO THE LAB : Get the blood work     GO TO THE FRONT DESK, PLEASE SCHEDULE YOUR APPOINTMENTS Come back for a physical exam by April 2024     Per our records you are due for your diabetic eye exam. Please contact your eye doctor to schedule an appointment. Please have them send copies of your office visit notes to [place]. Our fax number is [phone]. If you need a referral to an eye doctor please let us know.

## 2022-08-27 LAB — BASIC METABOLIC PANEL
BUN: 13 mg/dL (ref 6–23)
CO2: 25 mEq/L (ref 19–32)
Calcium: 10 mg/dL (ref 8.4–10.5)
Chloride: 101 mEq/L (ref 96–112)
Creatinine, Ser: 0.98 mg/dL (ref 0.40–1.50)
GFR: 90.05 mL/min (ref >60.00)
Glucose, Bld: 125 mg/dL — ABNORMAL HIGH (ref 70–99)
Potassium: 4.7 mEq/L (ref 3.5–5.1)
Sodium: 138 mEq/L (ref 135–145)

## 2022-08-27 LAB — LIPID PANEL
Cholesterol: 122 mg/dL (ref 0–200)
HDL: 47 mg/dL (ref >39.00)
NonHDL: 74.51
Total CHOL/HDL Ratio: 3
Triglycerides: 249 mg/dL — ABNORMAL HIGH (ref 0.0–149.0)
VLDL: 49.8 mg/dL — ABNORMAL HIGH (ref 0.0–40.0)

## 2022-08-27 LAB — LDL CHOLESTEROL, DIRECT: Direct LDL: 55 mg/dL

## 2022-08-27 NOTE — Assessment & Plan Note (Signed)
DM: Last A1c 7.3, glimepiride increased from 2 mg to 4 mg, he also takes metformin 2 tablets twice daily, Januvia.  Time in range improved, very rarely has low CBGs.  Plan: Check A1c, further advised w/ results HTN: Reports he checks BP from time to time and readings are usually normal.  Continue Cardizem, Micardis, last potassium borderline elevated, check BMP High cholesterol: Last LDL 118, on Crestor, Zetia added.  Check FLP Serous otitis: Left ear pain today, findings consistent with serous otitis, recommend Flonase Astepro.  See AVS. Insomnia: Sometimes takes 2 Xanax rather than 1, prescription changed to 1 or 2 as needed nightly.  PDMP okay, Rx sent. Preventive care: Encouraged to proceed with a flu shot and advised about COVID-vaccine RTC 01-2019 for CPX

## 2022-08-28 MED ORDER — EMPAGLIFLOZIN 10 MG PO TABS: 10.0000 mg | ORAL_TABLET | Freq: Every day | ORAL | 1 refills | Status: DC

## 2022-08-28 NOTE — Addendum Note (Signed)
Addended byConrad [place] D on: 08/28/2022 04:20 PM   Modules accepted: Orders

## 2022-10-05 ENCOUNTER — Other Ambulatory Visit: Payer: Self-pay | Admitting: Cardiology

## 2022-10-05 ENCOUNTER — Other Ambulatory Visit: Payer: Self-pay | Admitting: Internal Medicine

## 2022-10-08 NOTE — Telephone Encounter (Signed)
Rx refill sent to pharmacy. 

## 2022-10-14 ENCOUNTER — Other Ambulatory Visit: Payer: Self-pay | Admitting: Internal Medicine

## 2022-10-14 ENCOUNTER — Encounter: Payer: Self-pay | Admitting: Internal Medicine

## 2022-10-14 MED ORDER — FREESTYLE LIBRE 2 READER DEVI: 0 refills | Status: DC

## 2022-10-14 MED ORDER — FREESTYLE LIBRE 14 DAY READER DEVI: 0 refills | Status: DC

## 2022-10-14 MED ORDER — FREESTYLE LIBRE 2 SENSOR MISC: 5 refills | Status: DC

## 2022-10-14 MED ORDER — FREESTYLE LIBRE 14 DAY SENSOR MISC: 3 refills | Status: DC

## 2022-10-23 ENCOUNTER — Other Ambulatory Visit: Payer: Self-pay | Admitting: Internal Medicine

## 2022-10-23 MED ORDER — FREESTYLE LIBRE 3 SENSOR MISC: 0 refills | Status: DC

## 2022-11-12 ENCOUNTER — Encounter: Payer: Self-pay | Admitting: Cardiology

## 2022-11-12 ENCOUNTER — Ambulatory Visit: Payer: BC Managed Care – PPO | Attending: Cardiology | Admitting: Cardiology

## 2022-11-12 VITALS — BP 126/84 | HR 59 | Ht 71.0 in | Wt 211.0 lb

## 2022-11-12 DIAGNOSIS — I10 Essential (primary) hypertension: Secondary | ICD-10-CM | POA: Diagnosis not present

## 2022-11-12 DIAGNOSIS — I480 Paroxysmal atrial fibrillation: Secondary | ICD-10-CM

## 2022-11-12 DIAGNOSIS — E785 Hyperlipidemia, unspecified: Secondary | ICD-10-CM | POA: Diagnosis not present

## 2022-11-12 DIAGNOSIS — E119 Type 2 diabetes mellitus without complications: Secondary | ICD-10-CM | POA: Diagnosis not present

## 2022-11-12 NOTE — Patient Instructions (Addendum)

## 2022-11-12 NOTE — Progress Notes (Signed)
Cardiology Office Note:    Date:  11/12/2022   ID:  Bradley Jefferson, DOB [birth year], MRN [id number]  PCP:  Colon Branch, MD  Cardiologist:  Jenne Campus, MD    Referring MD: Colon Branch, MD   Chief Complaint  Patient presents with   Follow-up    History of Present Illness:    Bradley Jefferson is a 52 y.o. male with past medical history significant for paroxysmal atrial fibrillation, he is anticoagulated, rhythm has being stabilized with flecainide, additional problem include essential hypertension, dyslipidemia, diabetes.  He is anticoagulated since his CHADS2 Vascor equals 2. He comes today to months for follow-up.  Overall he is doing very well.  He denies of any chest pain tightness squeezing pressure burning chest no palpitation dizziness no swelling of lower extremities.  He works that he his work required a lot of walking he have no difficulty doing it.  Past Medical History:  Diagnosis Date   Annual physical exam 07/22/2011   Diabetes mellitus 03/2009   DM II (diabetes mellitus, type II), controlled ([place]) 03/07/2009   Patient requested to discontinue Actos and metformin and switch to Byetta 08-2010 Metformin restarted 12-2010 but self discontinued due to GI side effects Feet exam normal 11-15 Eye care discussed 10-14    Dyslipidemia 12/27/2019   Dyspnea on exertion 12/27/2019   ETD (eustachian tube dysfunction) 01/21/2012   EXTERNAL HEMORRHOIDS 10/12/2010   Qualifier: Diagnosis of  By: Larose Kells MD, Jose E.    GERD 03/07/2009   Qualifier: Diagnosis of  By: Dawson Bills     GERD (gastroesophageal reflux disease)    Hypertension 07/05/2018   Insomnia 02/23/2020   Low back pain    sees Chiro-herman   Palpitations 12/27/2019   Paroxysmal atrial fibrillation ([place]) 04/24/2020    Past Surgical History:  Procedure Laterality Date   abscess anal gland     s/p I&D    Current Medications: Current Meds  Medication Sig   ALPRAZolam (XANAX) 0.5 MG tablet Take 1-2 tablets (0.5-1 mg total) by mouth  at bedtime as needed for sleep.   cetirizine (ZYRTEC) 10 MG tablet Take 10 mg by mouth daily.   Continuous Blood Gluc Sensor (FREESTYLE LIBRE 3 SENSOR) MISC Check blood sugars as directed (Patient taking differently: 1 each by Other route as directed. Check blood sugars as directed)   diltiazem (CARDIZEM CD) 120 MG 24 hr capsule Take 1 capsule (120 mg total) by mouth daily.   empagliflozin (JARDIANCE) 10 MG TABS tablet Take 1 tablet (10 mg total) by mouth daily before breakfast.   ezetimibe (ZETIA) 10 MG tablet Take 1 tablet (10 mg total) by mouth daily.   flecainide (TAMBOCOR) 50 MG tablet TAKE 1 TABLET BY MOUTH TWICE A DAY   fluticasone (FLONASE) 50 MCG/ACT nasal spray Place 1 spray into both nostrils as needed for allergies or rhinitis. OTC   glimepiride (AMARYL) 2 MG tablet Take 2 tablets (4 mg total) by mouth daily with breakfast.   metFORMIN (GLUCOPHAGE-XR) 500 MG 24 hr tablet Take 2 tablets (1,000 mg total) by mouth 2 (two) times daily.   pantoprazole (PROTONIX) 40 MG tablet TAKE 1 TABLET BY MOUTH EVERY DAY BEFORE BREAKFAST (Patient taking differently: Take 40 mg by mouth daily.)   rosuvastatin (CRESTOR) 5 MG tablet Take 1 tablet (5 mg total) by mouth at bedtime.   sitaGLIPtin (JANUVIA) 100 MG tablet Take 1 tablet (100 mg total) by mouth daily.   telmisartan (MICARDIS) 80 MG tablet TAKE 1 TABLET  BY MOUTH EVERY DAY   triamcinolone cream (KENALOG) 0.1 % Apply 1 Application topically 3 (three) times daily as needed. (Patient taking differently: Apply 1 Application topically 3 (three) times daily as needed (rash).)   XARELTO 20 MG TABS tablet TAKE 1 TABLET BY MOUTH DAILY WITH SUPPER (Patient taking differently: Take 20 mg by mouth daily with supper.)     Allergies:   Patient has no known allergies.   Social History   Socioeconomic History   Marital status: Married    Spouse name: Not on file   Number of children: 0   Years of education: Not on file   Highest education level: Not on file   Occupational History   Occupation: manage a car parts department    Employer: [employer].  Tobacco Use   Smoking status: Former    Types: Cigarettes    Quit date: 07/22/1999    Years since quitting: 23.3   Smokeless tobacco: Never  Substance and Sexual Activity   Alcohol use: Yes    Comment: socially    Drug use: No   Sexual activity: Not on file  Other Topics Concern   Not on file  Social History Narrative   Lives w/ wife, she has h/o breast ca, also dx w/ H. Lymphoma ~ 03-2016, s/p chemo    Father and father in law live w/ them   Social Determinants of Health   Financial Resource Strain: Not on file  Food Insecurity: Not on file  Transportation Needs: Not on file  Physical Activity: Not on file  Stress: Not on file  Social Connections: Not on file     Family History: The patient's family history includes CAD (age of onset: 38) in his father; Colon polyps (age of onset: 38) in his mother; Diabetes in his father; Hyperlipidemia in his father; Stroke (age of onset: 8) in his mother. There is no history of Colon cancer or Prostate cancer. ROS:   Please see the history of present illness.    All 14 point review of systems negative except as described per history of present illness  EKGs/Labs/Other Studies Reviewed:      Recent Labs: 01/17/2022: ALT 22; Hemoglobin 14.3; Platelets 185.0 08/26/2022: BUN 13; Creatinine, Ser 0.98; Potassium 4.7; Sodium 138  Recent Lipid Panel    Component Value Date/Time   CHOL 122 08/26/2022 1133   CHOL 178 08/01/2020 1016   TRIG 249.0 (H) 08/26/2022 1133   HDL 47.00 08/26/2022 1133   HDL 41 08/01/2020 1016   CHOLHDL 3 08/26/2022 1133   VLDL 49.8 (H) 08/26/2022 1133   LDLCALC 84 01/17/2022 1043   LDLCALC 121 (H) 08/01/2020 1016   LDLCALC 119 (H) 12/03/2019 1607   LDLDIRECT 55.0 08/26/2022 1133    Physical Exam:    VS:  BP 126/84 (BP Location: Left Arm, Patient Position: Sitting)   Pulse (!) 59   Ht 5' 11"$  (1.803 m)    Wt 211 lb (95.7 kg)   SpO2 99%   BMI 29.43 kg/m     Wt Readings from Last 3 Encounters:  11/12/22 211 lb (95.7 kg)  08/26/22 216 lb 4 oz (98.1 kg)  05/23/22 213 lb (96.6 kg)     GEN:  Well nourished, well developed in no acute distress HEENT: Normal NECK: No JVD; No carotid bruits LYMPHATICS: No lymphadenopathy CARDIAC: RRR, no murmurs, no rubs, no gallops RESPIRATORY:  Clear to auscultation without rales, wheezing or rhonchi  ABDOMEN: Soft, non-tender, non-distended MUSCULOSKELETAL:  No edema;  No deformity  SKIN: Warm and dry LOWER EXTREMITIES: no swelling NEUROLOGIC:  Alert and oriented x 3 PSYCHIATRIC:  Normal affect   ASSESSMENT:    1. Paroxysmal atrial fibrillation (HCC)   2. Controlled type 2 diabetes mellitus without complication, without long-term current use of insulin ([place])   3. Dyslipidemia   4. Primary hypertension    PLAN:    In order of problems listed above:  Paroxysmal atrial fibrillation.  Maintaining sinus rhythm.  Denies have any palpitation he clearly felt episodes of atrial fibrillation when he had it.  Continue her rhythm control strategy with flecainide 50 mg twice daily, EKG showed no changes. He is also taking Xarelto for anticoagulation his CHADS2 Vascor equals 2.  Will continue. Type 2 diabetes I did review K PN which show me his last hemoglobin A1c of 8.2 he understand was a problem but he jokingly telling me that he was caught on the holiday time.  Now he is much better.  He is CGM and his monitor his sugar very carefully. Dyslipidemia I did review his K PN I have a data from November of last year with total cholesterol 122 HDL 47.  He is taking Zetia which I will continue.   Medication Adjustments/Labs and Tests Ordered: Current medicines are reviewed at length with the patient today.  Concerns regarding medicines are outlined above.  Orders Placed This Encounter  Procedures   EKG 12-Lead   Medication changes: No orders of the defined types  were placed in this encounter.   Signed, Park Liter, MD, [facility] 11/12/2022 8:26 AM    Black Physician in with pt for the entire time- Suann Larry. Kenton Kingfisher, RN

## 2022-11-20 ENCOUNTER — Other Ambulatory Visit: Payer: Self-pay | Admitting: Internal Medicine

## 2022-11-26 ENCOUNTER — Encounter: Payer: Self-pay | Admitting: Internal Medicine

## 2022-11-26 MED ORDER — FREESTYLE LIBRE 3 SENSOR MISC: 0 refills | Status: DC

## 2022-12-21 ENCOUNTER — Other Ambulatory Visit: Payer: Self-pay | Admitting: Cardiology

## 2022-12-23 ENCOUNTER — Other Ambulatory Visit: Payer: Self-pay | Admitting: Cardiology

## 2022-12-23 DIAGNOSIS — I480 Paroxysmal atrial fibrillation: Secondary | ICD-10-CM

## 2022-12-23 NOTE — Telephone Encounter (Signed)
Rx refill sent to pharmacy. 

## 2022-12-23 NOTE — Telephone Encounter (Signed)
Prescription refill request for Xarelto received.  Indication: PAF Last office visit: 11/12/22  Nelta Numbers MD Weight: 95.7kg Age: 52 Scr: 0.98 on 08/26/22  Epic CrCl: 122.07  Based on above findings Xarelto 20mg  daily is the appropriate dose.  Refill Approved.

## 2022-12-25 ENCOUNTER — Other Ambulatory Visit: Payer: Self-pay | Admitting: Internal Medicine

## 2023-01-03 ENCOUNTER — Other Ambulatory Visit: Payer: Self-pay | Admitting: Internal Medicine

## 2023-02-05 ENCOUNTER — Ambulatory Visit (INDEPENDENT_AMBULATORY_CARE_PROVIDER_SITE_OTHER): Payer: BC Managed Care – PPO | Admitting: Internal Medicine

## 2023-02-05 ENCOUNTER — Encounter: Payer: Self-pay | Admitting: Internal Medicine

## 2023-02-05 VITALS — BP 132/80 | HR 59 | Temp 98.3°F | Resp 18 | Ht 71.0 in | Wt 201.2 lb

## 2023-02-05 DIAGNOSIS — G4700 Insomnia, unspecified: Secondary | ICD-10-CM | POA: Diagnosis not present

## 2023-02-05 DIAGNOSIS — E119 Type 2 diabetes mellitus without complications: Secondary | ICD-10-CM

## 2023-02-05 DIAGNOSIS — E785 Hyperlipidemia, unspecified: Secondary | ICD-10-CM

## 2023-02-05 DIAGNOSIS — I10 Essential (primary) hypertension: Secondary | ICD-10-CM | POA: Diagnosis not present

## 2023-02-05 DIAGNOSIS — Z0001 Encounter for general adult medical examination with abnormal findings: Secondary | ICD-10-CM

## 2023-02-05 DIAGNOSIS — Z0000 Encounter for general adult medical examination without abnormal findings: Secondary | ICD-10-CM

## 2023-02-05 DIAGNOSIS — Z79899 Other long term (current) drug therapy: Secondary | ICD-10-CM

## 2023-02-05 DIAGNOSIS — Z7984 Long term (current) use of oral hypoglycemic drugs: Secondary | ICD-10-CM | POA: Diagnosis not present

## 2023-02-05 LAB — PSA: PSA: 0.7 ng/mL (ref 0.10–4.00)

## 2023-02-05 LAB — CBC WITH DIFFERENTIAL/PLATELET
Basophils Absolute: 0 10*3/uL (ref 0.0–0.1)
Basophils Relative: 0.6 % (ref 0.0–3.0)
Eosinophils Absolute: 0.2 10*3/uL (ref 0.0–0.7)
Eosinophils Relative: 2.8 % (ref 0.0–5.0)
HCT: 44.6 % (ref 39.0–52.0)
Hemoglobin: 15 g/dL (ref 13.0–17.0)
Lymphocytes Relative: 21 % (ref 12.0–46.0)
Lymphs Abs: 1.5 10*3/uL (ref 0.7–4.0)
MCHC: 33.5 g/dL (ref 30.0–36.0)
MCV: 91.7 fl (ref 78.0–100.0)
Monocytes Absolute: 0.4 10*3/uL (ref 0.1–1.0)
Monocytes Relative: 6 % (ref 3.0–12.0)
Neutro Abs: 5 10*3/uL (ref 1.4–7.7)
Neutrophils Relative %: 69.6 % (ref 43.0–77.0)
Platelets: 183 10*3/uL (ref 150.0–400.0)
RBC: 4.86 Mil/uL (ref 4.22–5.81)
RDW: 12.9 % (ref 11.5–15.5)
WBC: 7.2 10*3/uL (ref 4.0–10.5)

## 2023-02-05 LAB — ALT: ALT: 30 U/L (ref 0–53)

## 2023-02-05 LAB — BASIC METABOLIC PANEL
BUN: 14 mg/dL (ref 6–23)
CO2: 29 mEq/L (ref 19–32)
Calcium: 9.9 mg/dL (ref 8.4–10.5)
Chloride: 102 mEq/L (ref 96–112)
Creatinine, Ser: 1.03 mg/dL (ref 0.40–1.50)
GFR: 84.57 mL/min (ref >60.00)
Glucose, Bld: 97 mg/dL (ref 70–99)
Potassium: 4.2 mEq/L (ref 3.5–5.1)
Sodium: 139 mEq/L (ref 135–145)

## 2023-02-05 LAB — MICROALBUMIN / CREATININE URINE RATIO
Creatinine,U: 19 mg/dL
Microalb Creat Ratio: 3.7 mg/g (ref 0.0–30.0)
Microalb, Ur: <0.7 mg/dL (ref 0.0–1.9)

## 2023-02-05 LAB — LIPID PANEL
Cholesterol: 123 mg/dL (ref 0–200)
HDL: 42.6 mg/dL (ref >39.00)
LDL Cholesterol: 51 mg/dL (ref 0–99)
NonHDL: 80.04
Total CHOL/HDL Ratio: 3
Triglycerides: 144 mg/dL (ref 0.0–149.0)
VLDL: 28.8 mg/dL (ref 0.0–40.0)

## 2023-02-05 LAB — AST: AST: 23 U/L (ref 0–37)

## 2023-02-05 LAB — HEMOGLOBIN A1C: Hgb A1c MFr Bld: 6.2 % (ref 4.6–6.5)

## 2023-02-05 NOTE — Patient Instructions (Addendum)
Vaccines I recommend:  Covid booster Shingrix (shingles) Flu shot every fall  Diabetes: You can check your sugars at different times, they right times to do it are: - early in AM fasting  ( blood sugar goal 70-130) - 2 hours after a meal (blood sugar goal less than 180) - bedtime (goal 90-150)   Check the  blood pressure regularly BP GOAL is between 110/65 and  135/85. If it is consistently higher or lower, let me know    GO TO THE LAB : Get the blood work     GO TO THE FRONT DESK, PLEASE SCHEDULE YOUR APPOINTMENTS Come back for   checkup in 4 months  Per our records you are due for your diabetic eye exam. Please contact your eye doctor to schedule an appointment. Please have them send copies of your office visit notes to [place]. Our fax number is [phone]. If you need a referral to an eye doctor please let us know.

## 2023-02-05 NOTE — Progress Notes (Unsigned)
Subjective:    Patient ID: Bradley Jefferson, male    DOB: [birth year], 52 y.o.   MRN: [id number]  DOS:  02/05/2023 Type of visit - description: cpx Here for CPX  2 months ago we started to completely change his diet, stop all DM medicine except glipizide. Ambulatory CBGs are great including occasional low sugars in the 60s over 90s in the last 2 weeks   Wt Readings from Last 3 Encounters:  02/05/23 201 lb 4 oz (91.3 kg)  11/12/22 211 lb (95.7 kg)  08/26/22 216 lb 4 oz (98.1 kg)     Review of Systems See above   Past Medical History:  Diagnosis Date   Annual physical exam 07/22/2011   Diabetes mellitus 03/2009   DM II (diabetes mellitus, type II), controlled (HCC) 03/07/2009   Patient requested to discontinue Actos and metformin and switch to Byetta 08-2010 Metformin restarted 12-2010 but self discontinued due to GI side effects Feet exam normal 11-15 Eye care discussed 10-14    Dyslipidemia 12/27/2019   Dyspnea on exertion 12/27/2019   ETD (eustachian tube dysfunction) 01/21/2012   EXTERNAL HEMORRHOIDS 10/12/2010   Qualifier: Diagnosis of  By: Drue Novel MD, Evanie Buckle E.    GERD 03/07/2009   Qualifier: Diagnosis of  By: Shary Decamp     GERD (gastroesophageal reflux disease)    Hypertension 07/05/2018   Insomnia 02/23/2020   Low back pain    sees Chiro-herman   Palpitations 12/27/2019   Paroxysmal atrial fibrillation (HCC) 04/24/2020    Past Surgical History:  Procedure Laterality Date   abscess anal gland     s/p I&D    Current Outpatient Medications  Medication Instructions   ALPRAZolam (XANAX) 0.5-1 mg, Oral, At bedtime PRN   cetirizine (ZYRTEC) 10 mg, Oral, Daily   Continuous Blood Gluc Sensor (FREESTYLE LIBRE 3 SENSOR) MISC CHECK BLOOD SUGARS AS DIRECTED   diltiazem (CARDIZEM CD) 120 mg, Oral, Daily   empagliflozin (JARDIANCE) 10 mg, Oral, Daily before breakfast   ezetimibe (ZETIA) 10 mg, Oral, Daily   flecainide (TAMBOCOR) 50 mg, Oral, 2 times daily   fluticasone (FLONASE) 50  MCG/ACT nasal spray 1 spray, Each Nare, As needed, OTC    glimepiride (AMARYL) 4 mg, Oral, Daily with breakfast   metFORMIN (GLUCOPHAGE-XR) 1,000 mg, Oral, 2 times daily   pantoprazole (PROTONIX) 40 MG tablet TAKE 1 TABLET BY MOUTH EVERY DAY BEFORE BREAKFAST   rosuvastatin (CRESTOR) 5 mg, Oral, Daily at bedtime   sitaGLIPtin (JANUVIA) 100 mg, Oral, Daily   telmisartan (MICARDIS) 80 mg, Oral, Daily   triamcinolone cream (KENALOG) 0.1 % 1 Application, Topical, 3 times daily PRN   XARELTO 20 MG TABS tablet TAKE 1 TABLET BY MOUTH DAILY WITH SUPPER       Objective:   Physical Exam BP 132/80   Pulse (!) 59   Temp 98.3 F (36.8 C) (Oral)   Resp 18   Ht 5\' 11"  (1.803 m)   Wt 201 lb 4 oz (91.3 kg)   SpO2 96%   BMI 28.07 kg/m  General: Well developed, NAD, BMI noted Neck: No  thyromegaly  HEENT:  Normocephalic . Face symmetric, atraumatic Lungs:  CTA B Normal respiratory effort, no intercostal retractions, no accessory muscle use. Heart: RRR,  no murmur.  Abdomen:  Not distended, soft, non-tender. No rebound or rigidity.   Lower extremities: no pretibial edema bilaterally  Skin: Exposed areas without rash. Not pale. Not jaundice Neurologic:  alert & oriented X3.  Speech normal, gait appropriate  for age and unassisted Strength symmetric and appropriate for age.  Psych: Cognition and judgment appear intact.  Cooperative with normal attention span and concentration.  Behavior appropriate. No anxious or depressed appearing.     Assessment     Assessment   DM 2010, no neuropathy; can tolerate only metformin ER; Tradjenta worked but not covered by insurance  HTN dx 2-17 High cholesterol. (Atorva- aches) Atrial fibrillation, W/U initially neg, subsequently A. fib noted on his wrist watch, DX 04-2020 Insomnia : xanax rarely  GERD Occasional low back pain, sees a chiropractor. Occasional increase LFTs: Hepatitis B and C negative 12-2016 Ec zema: Occasionally uses  triamcinolone.    PLAN Here for CPX  DM: Last A1c was 8.2, at the time he was on glimepiride, metformin, Januvia.  Jardiance 10 mg was added Patient actually never started Jardiance, stop metformin, Januvia and change completely his diet.  Has lost approximately 15 pounds at home, ambulatory CBGs have gradually getting much better, in the last 2 months has been in the low side (90s, 60s). Patient is praised for his changes, will check A1c, depending on results we could either decrease glipizide to 1 tablet daily or switch to metformin which has a number of other benefits.  He agreed. HTN: BP is very good.  No change Paroxysmal A-fib: Saw cardiology 11/12/2022, maintaining NSR, continue flecainide, Xarelto.   High cholesterol: On Zetia and Crestor.  Labs. GERD: Since he changed his diet symptoms has decreased, on pantoprazole every other day. RTC 4 months.   -Td 2018 - PNM 23:  2014 and 2023; Prevnar 2016 - vax I rec: shingrix , covid booster, flu shot q fall  - (+) FH CV Dz: plan is control CV RF  - CCS: mother had multiple polyps at age 56, Cscope at [facility] 05/2020, +polyps per pt, x2 tubular adenoma as per path report.  Next 7 years per GI -Prostate cancer screening: No symptoms.  Check PSA - labs:  BMP AST ALT FLP CBC A1c PSA -Lifestyle: See HPI, significantly improve his diet. Praised   DM: Last A1c 7.3, glimepiride increased from 2 mg to 4 mg, he also takes metformin 2 tablets twice daily, Januvia.  Time in range improved, very rarely has low CBGs.  Plan: Check A1c, further advised w/ results HTN: Reports he checks BP from time to time and readings are usually normal.  Continue Cardizem, Micardis, last potassium borderline elevated, check BMP High cholesterol: Last LDL 118, on Crestor, Zetia added.  Check FLP Serous otitis: Left ear pain today, findings consistent with serous otitis, recommend Flonase Astepro.  See AVS. Insomnia: Sometimes takes 2 Xanax rather than 1, prescription  changed to 1 or 2 as needed nightly.  PDMP okay, Rx sent. Preventive care: Encouraged to proceed with a flu shot and advised about COVID-vaccine RTC 01-2019 for CPX

## 2023-02-06 ENCOUNTER — Encounter: Payer: Self-pay | Admitting: Internal Medicine

## 2023-02-06 MED ORDER — METFORMIN HCL ER 500 MG PO TB24: 500.0000 mg | ORAL_TABLET | Freq: Two times a day (BID) | ORAL | 1 refills | Status: DC

## 2023-02-06 NOTE — Assessment & Plan Note (Signed)
Here for CPX, see separate documentation DM:  Last A1c was 8.2, at the time he was on glimepiride, metformin, Januvia.  Jardiance 10 mg was added. Patient  never started Jardiance, stopped metformin, Januvia and change completely his diet.  Still on glimepiride Has lost approximately 15 pounds at home, ambulatory CBGs have gradually getting much better, in the last 2 months has been in the low side (90s, 60s). Patient is praised for his changes, will check A1c, depending on results we could either decrease glipizide to 1 tablet daily or switch to metformin which has a number of other benefits.  He agreed. Addendum: A1c is great, switch to metformin HTN: BP is very good.  No change Paroxysmal A-fib: Saw cardiology 11/12/2022, maintaining NSR, continue flecainide, Xarelto.   High cholesterol: On Zetia and Crestor.  Labs. GERD: Since he changed his diet symptoms has decreased, on pantoprazole every other day. RTC 4 months.

## 2023-02-06 NOTE — Assessment & Plan Note (Signed)
-  Td 2018 - PNM 23:  2014 and 2023; Prevnar 2016 - vax I rec: shingrix , covid booster, flu shot q fall - (+) FH CV Dz: plan is control CV RF  - CCS: mother had multiple polyps at age 51, Cscope at [facility] 05/2020, +polyps per pt, x2 tubular adenoma as per path report.  Next 7 years per GI -Prostate cancer screening: No symptoms.  Check PSA - labs:  BMP AST ALT FLP CBC A1c PSA -Lifestyle: See HPI, significantly improve his diet. Praised

## 2023-02-08 LAB — DRUG MONITORING PANEL 375977 , URINE

## 2023-02-08 LAB — DM TEMPLATE

## 2023-03-28 ENCOUNTER — Other Ambulatory Visit: Payer: Self-pay | Admitting: Cardiology

## 2023-04-09 ENCOUNTER — Telehealth: Payer: Self-pay | Admitting: Internal Medicine

## 2023-04-11 ENCOUNTER — Other Ambulatory Visit: Payer: Self-pay | Admitting: Internal Medicine

## 2023-04-11 NOTE — Telephone Encounter (Signed)
Requesting: alprazolam 0.5mg   Contract: 02/05/23 UDS: 02/05/23 Last Visit: 02/05/23 Next Visit: 06/10/23 Last Refill: 08/26/22 #60 and 1RF   Please Advise

## 2023-04-11 NOTE — Telephone Encounter (Signed)
PDMP okay, Rx sent 

## 2023-04-14 ENCOUNTER — Encounter: Payer: Self-pay | Admitting: Internal Medicine

## 2023-04-14 MED ORDER — FREESTYLE LIBRE 3 SENSOR MISC: 3 refills | Status: DC

## 2023-05-08 ENCOUNTER — Other Ambulatory Visit: Payer: Self-pay | Admitting: Internal Medicine

## 2023-05-22 ENCOUNTER — Encounter (INDEPENDENT_AMBULATORY_CARE_PROVIDER_SITE_OTHER): Payer: Self-pay

## 2023-05-23 ENCOUNTER — Other Ambulatory Visit: Payer: Self-pay | Admitting: Internal Medicine

## 2023-06-10 ENCOUNTER — Encounter: Payer: Self-pay | Admitting: Internal Medicine

## 2023-06-10 ENCOUNTER — Ambulatory Visit (INDEPENDENT_AMBULATORY_CARE_PROVIDER_SITE_OTHER): Payer: BC Managed Care – PPO | Admitting: Internal Medicine

## 2023-06-10 VITALS — BP 120/66 | HR 59 | Temp 97.9°F | Resp 16 | Ht 71.0 in | Wt 198.4 lb

## 2023-06-10 DIAGNOSIS — Z7984 Long term (current) use of oral hypoglycemic drugs: Secondary | ICD-10-CM | POA: Diagnosis not present

## 2023-06-10 DIAGNOSIS — I480 Paroxysmal atrial fibrillation: Secondary | ICD-10-CM | POA: Diagnosis not present

## 2023-06-10 DIAGNOSIS — E119 Type 2 diabetes mellitus without complications: Secondary | ICD-10-CM | POA: Diagnosis not present

## 2023-06-10 DIAGNOSIS — E785 Hyperlipidemia, unspecified: Secondary | ICD-10-CM

## 2023-06-10 LAB — LIPID PANEL
Cholesterol: 169 mg/dL (ref 0–200)
HDL: 38.6 mg/dL — ABNORMAL LOW (ref >39.00)
LDL Cholesterol: 86 mg/dL (ref 0–99)
NonHDL: 130.55
Total CHOL/HDL Ratio: 4
Triglycerides: 221 mg/dL — ABNORMAL HIGH (ref 0.0–149.0)
VLDL: 44.2 mg/dL — ABNORMAL HIGH (ref 0.0–40.0)

## 2023-06-10 LAB — HEMOGLOBIN A1C: Hgb A1c MFr Bld: 6.4 % (ref 4.6–6.5)

## 2023-06-10 NOTE — Progress Notes (Signed)
Subjective:    Patient ID: Bradley Jefferson, male    DOB: 1971-09-09, 52 y.o.   MRN: [id number]  DOS:  06/10/2023 Type of visit - description: f/u  Chronic medical problems addressed. At this point is not taking any cholesterol medication. Ambulatory CBGs are getting low, in the 60s. Anticoagulated, denies any bleeding.  Wt Readings from Last 3 Encounters:  06/10/23 198 lb 6 oz (90 kg)  02/05/23 201 lb 4 oz (91.3 kg)  11/12/22 211 lb (95.7 kg)     Review of Systems See above   Past Medical History:  Diagnosis Date   Annual physical exam 07/22/2011   Diabetes mellitus 03/2009   DM II (diabetes mellitus, type II), controlled (HCC) 03/07/2009   Patient requested to discontinue Actos and metformin and switch to Byetta 08-2010 Metformin restarted 12-2010 but self discontinued due to GI side effects Feet exam normal 11-15 Eye care discussed 10-14    Dyslipidemia 12/27/2019   Dyspnea on exertion 12/27/2019   ETD (eustachian tube dysfunction) 01/21/2012   EXTERNAL HEMORRHOIDS 10/12/2010   Qualifier: Diagnosis of  By: Drue Novel MD, Constanza Mincy E.    GERD 03/07/2009   Qualifier: Diagnosis of  By: Shary Decamp     GERD (gastroesophageal reflux disease)    Hypertension 07/05/2018   Insomnia 02/23/2020   Low back pain    sees Chiro-herman   Palpitations 12/27/2019   Paroxysmal atrial fibrillation (HCC) 04/24/2020    Past Surgical History:  Procedure Laterality Date   abscess anal gland     s/p I&D    Current Outpatient Medications  Medication Instructions   ALPRAZolam (XANAX) 0.5 MG tablet TAKE 1 TO 2 TABLETS (0.5-1 MG TOTAL) BY MOUTH AT BEDTIME AS NEEDED FOR SLEEP.   cetirizine (ZYRTEC) 10 mg, Oral, Daily   Continuous Glucose Sensor (FREESTYLE LIBRE 3 SENSOR) MISC Check blood sugars as directed   diltiazem (CARDIZEM CD) 120 MG 24 hr capsule Oral, Daily   ezetimibe (ZETIA) 10 mg, Oral, Daily   flecainide (TAMBOCOR) 50 mg, Oral, 2 times daily   fluticasone (FLONASE) 50 MCG/ACT nasal spray 1 spray, Each  Nare, As needed, OTC    metFORMIN (GLUCOPHAGE-XR) 500 mg, Oral, 2 times daily with meals   pantoprazole (PROTONIX) 40 mg, Oral, Daily before breakfast   rosuvastatin (CRESTOR) 5 mg, Oral, Daily at bedtime   telmisartan (MICARDIS) 80 mg, Oral, Daily   triamcinolone cream (KENALOG) 0.1 % 1 Application, Topical, 3 times daily PRN   XARELTO 20 MG TABS tablet TAKE 1 TABLET BY MOUTH DAILY WITH SUPPER       Objective:   Physical Exam BP 120/66   Pulse (!) 59   Temp 97.9 F (36.6 C) (Oral)   Resp 16   Ht 5\' 11"  (1.803 m)   Wt 198 lb 6 oz (90 kg)   SpO2 98%   BMI 27.67 kg/m  General:   Well developed, NAD, BMI noted. HEENT:  Normocephalic . Face symmetric, atraumatic DM foot exam: No edema, good pedal pulses, pinprick examination normal Neurologic:  alert & oriented X3.  Speech normal, gait appropriate for age and unassisted Psych--  Cognition and judgment appear intact.  Cooperative with normal attention span and concentration.  Behavior appropriate. No anxious or depressed appearing.      Assessment     Assessment   DM 2010, no neuropathy; can tolerate only metformin ER; Tradjenta worked but not covered by insurance  HTN dx 2-17 High cholesterol. (Atorva- aches) Atrial fibrillation, W/U initially neg, subsequently  A. fib noted on his wrist watch, DX 04-2020 Insomnia : xanax rarely  GERD Occasional low back pain, sees a chiropractor. Occasional increase LFTs: Hepatitis B and C negative 12-2016 Ec zema: Occasionally uses triamcinolone.    PLAN DM: Continues to do excellent with diet, has lost some additional pounds.  Recently had several readings in the 60s and felt poorly.  He just discontinued glimepiride, still on metformin.  Will check A1c.  Continue metformin only. Feet exam: Negative High cholesterol:  --Last LDL 51, I thought he was taking rosuvastatin//ezetimibe but he was only on Zetia. --6 weeks ago, he had ongoing right leg pain,  stopped Zetia, since then he  is asymptomatic. --I pointed out that that happened without rosuvastatin but he said "I also hurt when I was on rosuvastatin". Plan: Recheck FLP, he will be reluctant to take medication except for perhaps "Zetia every other day". A-fib: Anticoagulating, no apparent side effects, check a TSH. Vaccine advice provided RT to 4 months

## 2023-06-10 NOTE — Assessment & Plan Note (Signed)
DM: Continues to do excellent with diet, has lost some additional pounds.  Recently had several readings in the 60s and felt poorly.  He just discontinued glimepiride, still on metformin.  Will check A1c.  Continue metformin only. Feet exam: Negative High cholesterol:  --Last LDL 51, I thought he was taking rosuvastatin//ezetimibe but he was only on Zetia. --6 weeks ago, he had ongoing right leg pain,  stopped Zetia, since then he is asymptomatic. --I pointed out that that happened without rosuvastatin but he said "I also hurt when I was on rosuvastatin". Plan: Recheck FLP, he will be reluctant to take medication except for perhaps "Zetia every other day". A-fib: Anticoagulating, no apparent side effects, check a TSH. Vaccine advice provided RT to 4 months

## 2023-06-10 NOTE — Patient Instructions (Addendum)
Vaccines I recommend: Covid booster-new this fall Flu shot this fall Shingrix (shingles)  Check the  blood pressure regularly Blood pressure goal:  between 110/65 and  135/85. If it is consistently higher or lower, let me know    Diabetes, blood sugar goals: - early in AM fasting  ( blood sugar goal 70-130) - 2 hours after a meal (blood sugar goal less than 180)    GO TO THE LAB : Get the blood work     GO TO THE FRONT DESK, PLEASE SCHEDULE YOUR APPOINTMENTS Come back for   for a check up in 4 months    Per our records you are due for your diabetic eye exam. Please contact your eye doctor to schedule an appointment. Please have them send copies of your office visit notes to [place]. Our fax number is [phone]. If you need a referral to an eye doctor please let us know.

## 2023-06-11 LAB — TSH: TSH: 2.07 u[IU]/mL (ref 0.35–5.50)

## 2023-06-12 MED ORDER — EZETIMIBE 10 MG PO TABS: 10.0000 mg | ORAL_TABLET | Freq: Every day | ORAL | 3 refills | Status: DC

## 2023-06-12 NOTE — Addendum Note (Signed)
Addended byConrad Newport D on: 06/12/2023 02:47 PM   Modules accepted: Orders

## 2023-07-15 DIAGNOSIS — E119 Type 2 diabetes mellitus without complications: Secondary | ICD-10-CM | POA: Diagnosis not present

## 2023-07-15 LAB — HM DIABETES EYE EXAM

## 2023-08-06 ENCOUNTER — Other Ambulatory Visit: Payer: Self-pay | Admitting: Internal Medicine

## 2023-08-18 ENCOUNTER — Other Ambulatory Visit: Payer: Self-pay | Admitting: Internal Medicine

## 2023-08-19 ENCOUNTER — Other Ambulatory Visit: Payer: Self-pay | Admitting: Internal Medicine

## 2023-08-19 MED ORDER — DEXCOM G7 RECEIVER DEVI: 0 refills | Status: DC

## 2023-08-19 MED ORDER — DEXCOM G7 SENSOR MISC: 3 refills | Status: DC

## 2023-08-20 ENCOUNTER — Telehealth: Payer: Self-pay

## 2023-08-20 NOTE — Telephone Encounter (Signed)
PA denied. Continuous glucometers only covered for Pt's with type 2 diabetes when they are on insulin daily.

## 2023-08-20 NOTE — Telephone Encounter (Signed)
(  Sensors)  PA initiated via Covermymeds; KEY [id number]. Awaiting determination.

## 2023-08-21 ENCOUNTER — Encounter: Payer: Self-pay | Admitting: Internal Medicine

## 2023-08-21 ENCOUNTER — Other Ambulatory Visit: Payer: Self-pay | Admitting: Cardiology

## 2023-08-25 ENCOUNTER — Encounter: Payer: Self-pay | Admitting: Internal Medicine

## 2023-09-23 ENCOUNTER — Encounter: Payer: Self-pay | Admitting: Internal Medicine

## 2023-09-23 NOTE — Telephone Encounter (Signed)
 Care team updated and letter sent for eye exam notes.

## 2023-10-10 ENCOUNTER — Ambulatory Visit (INDEPENDENT_AMBULATORY_CARE_PROVIDER_SITE_OTHER): Payer: BC Managed Care – PPO | Admitting: Internal Medicine

## 2023-10-10 ENCOUNTER — Encounter: Payer: Self-pay | Admitting: Internal Medicine

## 2023-10-10 VITALS — BP 126/68 | HR 82 | Temp 97.6°F | Resp 16 | Ht 71.0 in | Wt 192.1 lb

## 2023-10-10 DIAGNOSIS — E785 Hyperlipidemia, unspecified: Secondary | ICD-10-CM | POA: Diagnosis not present

## 2023-10-10 DIAGNOSIS — E119 Type 2 diabetes mellitus without complications: Secondary | ICD-10-CM | POA: Diagnosis not present

## 2023-10-10 DIAGNOSIS — Z23 Encounter for immunization: Secondary | ICD-10-CM

## 2023-10-10 DIAGNOSIS — I480 Paroxysmal atrial fibrillation: Secondary | ICD-10-CM

## 2023-10-10 LAB — LIPID PANEL
Cholesterol: 155 mg/dL (ref 0–200)
HDL: 54.4 mg/dL (ref >39.00)
LDL Cholesterol: 65 mg/dL (ref 0–99)
NonHDL: 100.39
Total CHOL/HDL Ratio: 3
Triglycerides: 175 mg/dL — ABNORMAL HIGH (ref 0.0–149.0)
VLDL: 35 mg/dL (ref 0.0–40.0)

## 2023-10-10 LAB — HEMOGLOBIN A1C: Hgb A1c MFr Bld: 6.5 % (ref 4.6–6.5)

## 2023-10-10 LAB — BASIC METABOLIC PANEL
BUN: 27 mg/dL — ABNORMAL HIGH (ref 6–23)
CO2: 27 meq/L (ref 19–32)
Calcium: 10 mg/dL (ref 8.4–10.5)
Chloride: 100 meq/L (ref 96–112)
Creatinine, Ser: 0.98 mg/dL (ref 0.40–1.50)
GFR: 89.34 mL/min (ref >60.00)
Glucose, Bld: 142 mg/dL — ABNORMAL HIGH (ref 70–99)
Potassium: 5 meq/L (ref 3.5–5.1)
Sodium: 138 meq/L (ref 135–145)

## 2023-10-10 LAB — MICROALBUMIN / CREATININE URINE RATIO
Creatinine,U: 32.3 mg/dL
Microalb Creat Ratio: 2.2 mg/g (ref 0.0–30.0)
Microalb, Ur: <0.7 mg/dL (ref 0.0–1.9)

## 2023-10-10 NOTE — Patient Instructions (Addendum)
  Check the  blood pressure once or twice a month Blood pressure goal:  between 110/65 and  135/85. If it is consistently higher or lower, let me know     GO TO THE LAB : Get the blood work     Next visit with me in 5 months for your physical exam Please schedule it at the front desk

## 2023-10-10 NOTE — Progress Notes (Signed)
 Subjective:    Patient ID: Bradley Jefferson, male    DOB: [birth year], 53 y.o.   MRN: [id number]  DOS:  10/10/2023 Type of visit - description: f/u  Chronic medical problems addressed. No ambulatory BPs A-fib, anticoagulated, denies blood in the stools or urine, no chest pain or difficulty breathing.  Very seldom has episodes of palpitations that lasts less than a minute.  Wt Readings from Last 3 Encounters:  10/10/23 192 lb 2 oz (87.1 kg)  06/10/23 198 lb 6 oz (90 kg)  02/05/23 201 lb 4 oz (91.3 kg)   Review of Systems See above   Past Medical History:  Diagnosis Date   Annual physical exam 07/22/2011   Diabetes mellitus 03/2009   DM II (diabetes mellitus, type II), controlled (HCC) 03/07/2009   Patient requested to discontinue Actos and metformin  and switch to Byetta  08-2010 Metformin  restarted 12-2010 but self discontinued due to GI side effects Feet exam normal 11-15 Eye care discussed 10-14    Dyslipidemia 12/27/2019   Dyspnea on exertion 12/27/2019   ETD (eustachian tube dysfunction) 01/21/2012   EXTERNAL HEMORRHOIDS 10/12/2010   Qualifier: Diagnosis of  By: Amon MD, Naraya Stoneberg E.    GERD 03/07/2009   Qualifier: Diagnosis of  By: Lavell Orf     GERD (gastroesophageal reflux disease)    Hypertension 07/05/2018   Insomnia 02/23/2020   Low back pain    sees Chiro-herman   Palpitations 12/27/2019   Paroxysmal atrial fibrillation (HCC) 04/24/2020    Past Surgical History:  Procedure Laterality Date   abscess anal gland     s/p I&D    Current Outpatient Medications  Medication Instructions   ALPRAZolam  (XANAX ) 0.5 MG tablet TAKE 1 TO 2 TABLETS (0.5-1 MG TOTAL) BY MOUTH AT BEDTIME AS NEEDED FOR SLEEP.   cetirizine (ZYRTEC) 10 mg, Daily   Continuous Glucose Receiver (DEXCOM G7 RECEIVER) DEVI Check blood sugars as directed   diltiazem  (CARDIZEM  CD) 120 mg, Oral, Daily   ezetimibe  (ZETIA ) 10 mg, Oral, Daily   flecainide  (TAMBOCOR ) 50 mg, Oral, 2 times daily   fluticasone  (FLONASE ) 50  MCG/ACT nasal spray 1 spray, As needed   metFORMIN  (GLUCOPHAGE -XR) 500 mg, Oral, 2 times daily with meals   pantoprazole  (PROTONIX ) 40 mg, Oral, Daily before breakfast   telmisartan  (MICARDIS ) 80 mg, Oral, Daily   XARELTO  20 MG TABS tablet TAKE 1 TABLET BY MOUTH DAILY WITH SUPPER       Objective:   Physical Exam BP 126/68   Pulse 82   Temp 97.6 F (36.4 C) (Oral)   Resp 16   Ht 5' 11 (1.803 m)   Wt 192 lb 2 oz (87.1 kg)   SpO2 97%   BMI 26.80 kg/m  General:   Well developed, NAD, BMI noted. HEENT:  Normocephalic . Face symmetric, atraumatic Lungs:  CTA B Normal respiratory effort, no intercostal retractions, no accessory muscle use. Heart: RRR,  no murmur.  Lower extremities: no pretibial edema bilaterally  Skin: Not pale. Not jaundice Neurologic:  alert & oriented X3.  Speech normal, gait appropriate for age and unassisted Psych--  Cognition and judgment appear intact.  Cooperative with normal attention span and concentration.  Behavior appropriate. No anxious or depressed appearing.      Assessment    Assessment   DM 2010, no neuropathy; can tolerate only metformin  ER; Tradjenta  worked but not covered by insurance  HTN dx 2-17 High cholesterol. (Atorva- aches) Atrial fibrillation, W/U initially neg, subsequently A. fib noted on  his wrist watch, DX 04-2020 Insomnia : xanax  rarely  GERD Occasional low back pain, sees a chiropractor. Occasional increase LFTs: Hepatitis B and C negative 12-2016 Ec zema: Occasionally uses triamcinolone .    PLAN DM: Continue to do well with diet, has a  over-the-counter CGM, average CBG in the 150s.  Check a BMP, A1c micro High cholesterol: See LOV,  after the last FLP he agreed to start Zetia  4 weeks ago, still has occasional aches and pains but is willing to continue with it.  Check labs. Atrial fibrillation: Anticoagulated without apparent problems.  Plans to see cardiology in the next few months for his yearly checkup. Vaccine  advised: Agreed to take a flu shot, declined any other vaccines today. RTC 5 months CPX

## 2023-10-10 NOTE — Assessment & Plan Note (Signed)
 DM: Continue to do well with diet, has a  over-the-counter CGM, average CBG in the 150s.  Check a BMP, A1c micro High cholesterol: See LOV,  after the last FLP he agreed to start Zetia  4 weeks ago, still has occasional aches and pains but is willing to continue with it.  Check labs. Atrial fibrillation: Anticoagulated without apparent problems.  Plans to see cardiology in the next few months for his yearly checkup. Vaccine advised: Agreed to take a flu shot, declined any other vaccines today. RTC 5 months CPX

## 2023-10-14 ENCOUNTER — Other Ambulatory Visit: Payer: Self-pay | Admitting: Internal Medicine

## 2023-11-19 ENCOUNTER — Other Ambulatory Visit: Payer: Self-pay | Admitting: Cardiology

## 2023-11-19 ENCOUNTER — Other Ambulatory Visit: Payer: Self-pay | Admitting: Internal Medicine

## 2023-11-26 ENCOUNTER — Other Ambulatory Visit: Payer: Self-pay | Admitting: Cardiology

## 2023-11-26 NOTE — Telephone Encounter (Signed)
 Prescription sent to pharmacy.

## 2023-12-12 ENCOUNTER — Other Ambulatory Visit: Payer: Self-pay | Admitting: Cardiology

## 2023-12-16 ENCOUNTER — Other Ambulatory Visit: Payer: Self-pay | Admitting: Cardiology

## 2023-12-16 DIAGNOSIS — I480 Paroxysmal atrial fibrillation: Secondary | ICD-10-CM

## 2023-12-17 ENCOUNTER — Telehealth: Payer: Self-pay

## 2023-12-17 ENCOUNTER — Other Ambulatory Visit: Payer: Self-pay

## 2023-12-17 DIAGNOSIS — I480 Paroxysmal atrial fibrillation: Secondary | ICD-10-CM

## 2023-12-17 MED ORDER — RIVAROXABAN 20 MG PO TABS: 20.0000 mg | ORAL_TABLET | Freq: Every day | ORAL | 0 refills | Status: DC

## 2023-12-17 NOTE — Telephone Encounter (Signed)
 Pt has scheduled appt on 12/23/23 with Wallis Bamberg, NP. Refill sent.

## 2023-12-17 NOTE — Telephone Encounter (Signed)
 Prescription refill request for Xarelto received.  Indication: Afib  Last office visit: 11/12/22 Bradley Jefferson)  Weight: 87.1kg Age: 53 Scr: 0.98 (10/10/23)  CrCl: 109.45ml/min  Appropriate dose. Office visit overdue. Message sent to schedulers

## 2023-12-17 NOTE — Telephone Encounter (Signed)
 Called patient and scheduled an appointment for him to see Wallis Bamberg, NP on 12/23/23 at 3:10 pm. Patient was totally out of his Xarelto so a prescription was sent to his pharmacy for enough medication to get him through to his appointment.

## 2023-12-18 ENCOUNTER — Other Ambulatory Visit (HOSPITAL_COMMUNITY): Payer: Self-pay

## 2023-12-18 ENCOUNTER — Telehealth: Payer: Self-pay | Admitting: Pharmacy Technician

## 2023-12-18 NOTE — Telephone Encounter (Signed)
 Pharmacy Patient Advocate Encounter   Received notification from Patient Pharmacy that prior authorization for [facility] G7 RECEIVER is required/requested.   Insurance verification completed.   The patient is insured through [facility] .   Per test claim: The current 30 day co-pay is, $25.07.  No PA needed at this time. This test claim was processed through [facility]- copay amounts may vary at other pharmacies due to pharmacy/plan contracts, or as the patient moves through the different stages of their insurance plan.

## 2023-12-20 ENCOUNTER — Other Ambulatory Visit: Payer: Self-pay | Admitting: Cardiology

## 2023-12-22 ENCOUNTER — Encounter: Payer: Self-pay | Admitting: Cardiology

## 2023-12-22 NOTE — Telephone Encounter (Signed)
 Rx refill sent to pharmacy.

## 2023-12-23 ENCOUNTER — Ambulatory Visit: Attending: Cardiology | Admitting: Cardiology

## 2023-12-23 ENCOUNTER — Encounter: Payer: Self-pay | Admitting: Cardiology

## 2023-12-23 VITALS — BP 114/76 | HR 68 | Ht 71.0 in | Wt 194.0 lb

## 2023-12-23 DIAGNOSIS — D6859 Other primary thrombophilia: Secondary | ICD-10-CM

## 2023-12-23 DIAGNOSIS — Z5181 Encounter for therapeutic drug level monitoring: Secondary | ICD-10-CM

## 2023-12-23 DIAGNOSIS — Z79899 Other long term (current) drug therapy: Secondary | ICD-10-CM

## 2023-12-23 DIAGNOSIS — I480 Paroxysmal atrial fibrillation: Secondary | ICD-10-CM

## 2023-12-23 DIAGNOSIS — R011 Cardiac murmur, unspecified: Secondary | ICD-10-CM | POA: Diagnosis not present

## 2023-12-23 MED ORDER — FLECAINIDE ACETATE 50 MG PO TABS: 50.0000 mg | ORAL_TABLET | Freq: Two times a day (BID) | ORAL | 3 refills | Status: DC

## 2023-12-23 MED ORDER — DILTIAZEM HCL ER COATED BEADS 120 MG PO CP24: 120.0000 mg | ORAL_CAPSULE | Freq: Every day | ORAL | 3 refills | Status: DC

## 2023-12-23 MED ORDER — RIVAROXABAN 20 MG PO TABS: 20.0000 mg | ORAL_TABLET | Freq: Every day | ORAL | 3 refills | Status: AC

## 2023-12-23 NOTE — Progress Notes (Signed)
 Cardiology Office Note:  .   Date:  12/23/2023  ID:  Bradley Jefferson, DOB Jan 06, 1971, MRN [id number] PCP: Wanda Plump, MD  [facility] Providers Cardiologist:  Gypsy Balsam, MD    History of Present Illness: .   Bradley Jefferson is a 53 y.o. male with a past medical history of hypertension, PAF on Xarelto and flecainide, GERD, DM 2, dyslipidemia, palpitations.  07/11/2020 Lexiscan low risk, normal study 04/24/2020 monitor average heart rate 66 bpm, 26 episodes of SVT, AF burden less than 1% 01/04/2020 echo EF 55 to 60%, no valvular abnormalities  Most recently evaluated by Dr. Bing Matter on 11/12/2022, he was maintaining sinus rhythm well from a cardiac perspective no changes were made to his plan of care and he was advised to follow-up in 1 year.  He presents today for follow-up of his atrial fibrillation and hypertension.  He is doing well from a cardiac perspective, he offers no formal complaints.  He follows closely with his PCP.  He has made major lifestyle changes and has had a good amount of weight loss.  He works, stays active.  He is tolerating his Xarelto without any hematochezia, hematuria, hemoptysis. He denies chest pain, palpitations, dyspnea, pnd, orthopnea, n, v, dizziness, syncope, edema, weight gain, or early satiety.   ROS: Review of Systems  All other systems reviewed and are negative.   Studies Reviewed: Marland Kitchen   EKG Interpretation Date/Time:  Tuesday December 23 2023 15:21:41 EDT Ventricular Rate:  68 PR Interval:  190 QRS Duration:  96 QT Interval:  410 QTC Calculation: 435 R Axis:   -39  Text Interpretation: Normal sinus rhythm Left axis deviation Minimal voltage criteria for LVH, may be normal variant ( R in aVL ) Cannot rule out Anterior infarct , age undetermined When compared with ECG of 02-Dec-2015 16:36, PREVIOUS ECG IS PRESENT Confirmed by Wallis Bamberg [phone]) on 12/23/2023 3:38:23 PM    Cardiac Studies & Procedures    ______________________________________________________________________________________________   STRESS TESTS  MYOCARDIAL PERFUSION IMAGING 07/11/2020  Narrative  Nuclear stress EF: 59%.  There was no ST segment deviation noted during stress.  Defect 1: There is a small defect of moderate severity present in the apex location.  The study is normal.  This is a low risk study.  The left ventricular ejection fraction is normal (55-65%).  Normal stress nuclear study with apical thinning but no ischemia.  Gated ejection fraction 59% with normal wall motion.   ECHOCARDIOGRAM  ECHOCARDIOGRAM COMPLETE 01/04/2020  Narrative ECHOCARDIOGRAM REPORT    Patient Name:   Bradley Jefferson Date of Exam: 01/04/2020 Medical Rec #:  [id number]     Height:       70.0 in Accession #:    [id number]    Weight:       209.4 lb Date of Birth:  05/07/1971      BSA:          2.128 m Patient Age:    48 years      BP:           124/86 mmHg Patient Gender: M             HR:           59 bpm. Exam Location:  [place]  Procedure: 2D Echo, Cardiac Doppler, Color Doppler and Strain Analysis  Indications:    Palpitations  History:        Patient has no prior history of Echocardiogram examinations. Arrythmias:Palpitations, Signs/Symptoms:Dyspnea; Risk  Factors:Hypertension, Diabetes and Dyslipidemia.  Sonographer:    Sinda Du RDCS (AE) Referring Phys: [phone] Marveen Reeks KRASOWSKI  IMPRESSIONS   1. Left ventricular ejection fraction, by estimation, is 55 to 60%. The left ventricle has normal function. The left ventricle has no regional wall motion abnormalities. Left ventricular diastolic parameters were normal. The average left ventricular global longitudinal strain is -20.1 %. 2. Right ventricular systolic function is normal. The right ventricular size is normal. There is normal pulmonary artery systolic pressure. 3. The mitral valve is normal in structure. No evidence of mitral valve regurgitation.  No evidence of mitral stenosis. 4. The aortic valve is tricuspid. Aortic valve regurgitation is not visualized. No aortic stenosis is present. 5. The inferior vena cava is normal in size with greater than 50% respiratory variability, suggesting right atrial pressure of 3 mmHg.  FINDINGS Left Ventricle: Left ventricular ejection fraction, by estimation, is 55 to 60%. The left ventricle has normal function. The left ventricle has no regional wall motion abnormalities. The average left ventricular global longitudinal strain is -20.1 %. The left ventricular internal cavity size was normal in size. There is borderline left ventricular hypertrophy. Left ventricular diastolic parameters were normal.  Right Ventricle: The right ventricular size is normal. No increase in right ventricular wall thickness. Right ventricular systolic function is normal. There is normal pulmonary artery systolic pressure. The tricuspid regurgitant velocity is 2.32 m/s, and with an assumed right atrial pressure of 3 mmHg, the estimated right ventricular systolic pressure is 24.5 mmHg.  Left Atrium: Left atrial size was normal in size.  Right Atrium: Right atrial size was normal in size.  Pericardium: There is no evidence of pericardial effusion.  Mitral Valve: The mitral valve is normal in structure. Normal mobility of the mitral valve leaflets. No evidence of mitral valve regurgitation. No evidence of mitral valve stenosis.  Tricuspid Valve: The tricuspid valve is normal in structure. Tricuspid valve regurgitation is trivial. No evidence of tricuspid stenosis.  Aortic Valve: The aortic valve is tricuspid. Aortic valve regurgitation is not visualized. No aortic stenosis is present. Aortic valve mean gradient measures 8.0 mmHg. Aortic valve peak gradient measures 13.7 mmHg. Aortic valve area, by VTI measures 1.93 cm.  Pulmonic Valve: The pulmonic valve was normal in structure. Pulmonic valve regurgitation is not  visualized. No evidence of pulmonic stenosis.  Aorta: The aortic root and ascending aorta are structurally normal, with no evidence of dilitation.  Venous: The pulmonary veins were not well visualized. The inferior vena cava is normal in size with greater than 50% respiratory variability, suggesting right atrial pressure of 3 mmHg.  IAS/Shunts: No atrial level shunt detected by color flow Doppler.   LEFT VENTRICLE PLAX 2D LVIDd:         5.06 cm  Diastology LVIDs:         3.33 cm  LV e' lateral:   14.50 cm/s LV PW:         1.16 cm  LV E/e' lateral: 4.2 LV IVS:        1.12 cm  LV e' medial:    9.57 cm/s LVOT diam:     2.00 cm  LV E/e' medial:  6.4 LV SV:         69 LV SV Index:   32       2D Longitudinal Strain LVOT Area:     3.14 cm 2D Strain GLS Avg:     -20.1 %   RIGHT VENTRICLE  IVC RV Basal diam:  4.45 cm     IVC diam: 1.89 cm RV S prime:     16.00 cm/s TAPSE (M-mode): 2.0 cm  LEFT ATRIUM             Index       RIGHT ATRIUM           Index LA diam:        3.50 cm 1.64 cm/m  RA Area:     17.90 cm LA Vol (A2C):   56.9 ml 26.73 ml/m RA Volume:   45.20 ml  21.24 ml/m LA Vol (A4C):   43.9 ml 20.63 ml/m LA Biplane Vol: 52.3 ml 24.57 ml/m AORTIC VALVE AV Area (Vmax):    1.85 cm AV Area (Vmean):   1.73 cm AV Area (VTI):     1.93 cm AV Vmax:           185.00 cm/s AV Vmean:          135.000 cm/s AV VTI:            0.358 m AV Peak Grad:      13.7 mmHg AV Mean Grad:      8.0 mmHg LVOT Vmax:         109.00 cm/s LVOT Vmean:        74.200 cm/s LVOT VTI:          0.220 m LVOT/AV VTI ratio: 0.61  AORTA Ao Root diam: 2.70 cm Ao Asc diam:  3.50 cm  MITRAL VALVE               TRICUSPID VALVE MV Area (PHT): 2.39 cm    TR Peak grad:   21.5 mmHg MV Decel Time: 317 msec    TR Vmax:        232.00 cm/s MV E velocity: 61.00 cm/s MV A velocity: 65.20 cm/s  SHUNTS MV E/A ratio:  0.94        Systemic VTI:  0.22 m Systemic Diam: 2.00 cm  Norman Herrlich  MD Electronically signed by Norman Herrlich MD Signature Date/Time: 01/04/2020/12:37:41 PM    Final    MONITORS  LONG TERM MONITOR (3-14 DAYS) 04/24/2020  Narrative Bradley Jefferson, DOB [birth year], MRN [id number]  HOLTER MONITOR REPORT:    Date of test:                 04/24/2020 Duration of test:           7 days Indication:                    Atrial fibrillation Ordering physician:  Georgeanna Lea, MD Referring physician:  Georgeanna Lea, MD   Baseline rhythm: Sinus  Minimum heart rate: 40 BPM.  Average heart rate: 66 BPM.  Maximal heart rate 133 BPM.  Atrial arrhythmia: Total of 26 supraventricular tachycardia.  Fastest episode 4 beats at rate of 176, longest episode 16 beats at rate of 126.  There was episodes of atrial fibrillation total burden 1% heart rate between 73 and 185 with average 106.  The longest episode of atrial fibrillation lasted 1 hour 50 minutes.  Those were symptomatic.  Ventricular arrhythmia: Rare PVCs noted.  Conduction abnormality: None  Symptoms: Symptomatic atrial fibrillation   Conclusion: Paroxysmal atrial fibrillation with total burden of 1% with average heart rate 106.  Interpreting  cardiologist: Gypsy Balsam, MD Date: 05/19/2020 11:26 AM       ______________________________________________________________________________________________  Risk Assessment/Calculations:    CHA2DS2-VASc Score = 2   This indicates a 2.2% annual risk of stroke. The patient's score is based upon: CHF History: 0 HTN History: 1 Diabetes History: 1 Stroke History: 0 Vascular Disease History: 0 Age Score: 0 Gender Score: 0            Physical Exam:   VS:  BP 114/76   Pulse 68   Ht 5\' 11"  (1.803 m)   Wt 194 lb (88 kg)   SpO2 95%   BMI 27.06 kg/m    Wt Readings from Last 3 Encounters:  12/23/23 194 lb (88 kg)  10/10/23 192 lb 2 oz (87.1 kg)  06/10/23 198 lb 6 oz (90 kg)    GEN: Well nourished, well developed in no acute  distress NECK: No JVD; No carotid bruits CARDIAC: RRR, 2/6 systolic murmur, rubs, gallops RESPIRATORY:  Clear to auscultation without rales, wheezing or rhonchi  ABDOMEN: Soft, non-tender, non-distended EXTREMITIES:  No edema; No deformity   ASSESSMENT AND PLAN: .   Paroxysmal atrial fibrillation/hypercoagulable state/on flecainide therapy-CHA2DS2-VASc score of 2.  He is maintaining sinus rhythm.  EKG with normal intervals.  Continue flecainide 50 mg twice daily.  Continue Xarelto 20 mg daily--no indication for dose reduction creatinine 111, hemoglobin and hematocrit without evidence of anemia.  Dyslipidemia-most recent LDL was controlled at 65 on 10/10/2023.  Murmur - soft murmur appreciated, echo in 2021 without valvular abnormalities. Discussed echo, he does not feel necessary at this time. Will discuss with Dr. Bing Matter at his next office visit.   DM2-followed by his PCP, most recent A1c was 6.5%, has been working diligently on weight loss and is moved his       Dispo: Follow up in 1 year at [facility] with Dr. Bing Matter.   Signed, Flossie Dibble, NP

## 2023-12-23 NOTE — Patient Instructions (Signed)
 Medication Instructions:  Your physician recommends that you continue on your current medications as directed. Please refer to the Current Medication list given to you today.   *If you need a refill on your cardiac medications before your next appointment, please call your pharmacy*   Lab Work: NONE If you have labs (blood work) drawn today and your tests are completely normal, you will receive your results only by: MyChart Message (if you have MyChart) OR A paper copy in the mail If you have any lab test that is abnormal or we need to change your treatment, we will call you to review the results.   Testing/Procedures: NONE   Follow-Up: At [facility], you and your health needs are our priority.  As part of our continuing mission to provide you with exceptional heart care, we have created designated Provider Care Teams.  These Care Teams include your primary Cardiologist (physician) and Advanced Practice Providers (APPs -  Physician Assistants and Nurse Practitioners) who all work together to provide you with the care you need, when you need it.  We recommend signing up for the patient portal called "MyChart".  Sign up information is provided on this After Visit Summary.  MyChart is used to connect with patients for Virtual Visits (Telemedicine).  Patients are able to view lab/test results, encounter notes, upcoming appointments, etc.  Non-urgent messages can be sent to your provider as well.   To learn more about what you can do with MyChart, go to [email or web address].    Your next appointment:   1 year(s)  Provider:   Gypsy Balsam, MD    Other Instructions

## 2024-01-18 ENCOUNTER — Other Ambulatory Visit: Payer: Self-pay | Admitting: Cardiology

## 2024-03-09 ENCOUNTER — Ambulatory Visit (INDEPENDENT_AMBULATORY_CARE_PROVIDER_SITE_OTHER): Payer: BC Managed Care – PPO | Admitting: Internal Medicine

## 2024-03-09 ENCOUNTER — Encounter: Payer: Self-pay | Admitting: Internal Medicine

## 2024-03-09 VITALS — BP 118/68 | HR 68 | Temp 98.1°F | Resp 16 | Ht 71.0 in | Wt 196.0 lb

## 2024-03-09 DIAGNOSIS — Z0000 Encounter for general adult medical examination without abnormal findings: Secondary | ICD-10-CM | POA: Diagnosis not present

## 2024-03-09 DIAGNOSIS — E785 Hyperlipidemia, unspecified: Secondary | ICD-10-CM | POA: Diagnosis not present

## 2024-03-09 DIAGNOSIS — Z7984 Long term (current) use of oral hypoglycemic drugs: Secondary | ICD-10-CM

## 2024-03-09 DIAGNOSIS — E119 Type 2 diabetes mellitus without complications: Secondary | ICD-10-CM

## 2024-03-09 DIAGNOSIS — Z125 Encounter for screening for malignant neoplasm of prostate: Secondary | ICD-10-CM

## 2024-03-09 DIAGNOSIS — Z0001 Encounter for general adult medical examination with abnormal findings: Secondary | ICD-10-CM

## 2024-03-09 DIAGNOSIS — I10 Essential (primary) hypertension: Secondary | ICD-10-CM

## 2024-03-09 DIAGNOSIS — Z789 Other specified health status: Secondary | ICD-10-CM

## 2024-03-09 LAB — COMPREHENSIVE METABOLIC PANEL WITH GFR
ALT: 19 U/L (ref 0–53)
AST: 17 U/L (ref 0–37)
Albumin: 4.8 g/dL (ref 3.5–5.2)
Alkaline Phosphatase: 48 U/L (ref 39–117)
BUN: 20 mg/dL (ref 6–23)
CO2: 29 meq/L (ref 19–32)
Calcium: 9.8 mg/dL (ref 8.4–10.5)
Chloride: 101 meq/L (ref 96–112)
Creatinine, Ser: 1.01 mg/dL (ref 0.40–1.50)
GFR: 85.92 mL/min (ref >60.00)
Glucose, Bld: 189 mg/dL — ABNORMAL HIGH (ref 70–99)
Potassium: 4.6 meq/L (ref 3.5–5.1)
Sodium: 137 meq/L (ref 135–145)
Total Bilirubin: 0.8 mg/dL (ref 0.2–1.2)
Total Protein: 7 g/dL (ref 6.0–8.3)

## 2024-03-09 LAB — LIPID PANEL
Cholesterol: 157 mg/dL (ref 0–200)
HDL: 50.8 mg/dL (ref >39.00)
LDL Cholesterol: 75 mg/dL (ref 0–99)
NonHDL: 105.95
Total CHOL/HDL Ratio: 3
Triglycerides: 157 mg/dL — ABNORMAL HIGH (ref 0.0–149.0)
VLDL: 31.4 mg/dL (ref 0.0–40.0)

## 2024-03-09 LAB — CBC WITH DIFFERENTIAL/PLATELET
Basophils Absolute: 0 10*3/uL (ref 0.0–0.1)
Basophils Relative: 0.5 % (ref 0.0–3.0)
Eosinophils Absolute: 0.2 10*3/uL (ref 0.0–0.7)
Eosinophils Relative: 2.8 % (ref 0.0–5.0)
HCT: 43.5 % (ref 39.0–52.0)
Hemoglobin: 14.7 g/dL (ref 13.0–17.0)
Lymphocytes Relative: 26.3 % (ref 12.0–46.0)
Lymphs Abs: 1.7 10*3/uL (ref 0.7–4.0)
MCHC: 33.9 g/dL (ref 30.0–36.0)
MCV: 91.3 fl (ref 78.0–100.0)
Monocytes Absolute: 0.4 10*3/uL (ref 0.1–1.0)
Monocytes Relative: 6.6 % (ref 3.0–12.0)
Neutro Abs: 4.1 10*3/uL (ref 1.4–7.7)
Neutrophils Relative %: 63.8 % (ref 43.0–77.0)
Platelets: 168 10*3/uL (ref 150.0–400.0)
RBC: 4.77 Mil/uL (ref 4.22–5.81)
RDW: 12 % (ref 11.5–15.5)
WBC: 6.4 10*3/uL (ref 4.0–10.5)

## 2024-03-09 LAB — PSA: PSA: 0.79 ng/mL (ref 0.10–4.00)

## 2024-03-09 LAB — HEMOGLOBIN A1C: Hgb A1c MFr Bld: 8 % — ABNORMAL HIGH (ref 4.6–6.5)

## 2024-03-09 MED ORDER — FREESTYLE LIBRE 3 PLUS SENSOR MISC
3 refills | Status: DC
Start: 2024-03-09 — End: 2024-07-12

## 2024-03-09 NOTE — Assessment & Plan Note (Signed)
 Here for CPX -Td 2018 - PNM 23:  2014 and 2023; Prevnar 2016 - vax I rec: shingrix , covid booster, flu shot q fall - (+) FH CV Dz: plan is control CV RF  - CCS: mother + polyps at age 53, [facility]scope at [facility] 05/2020, +polyps per pt, x2 tubular adenoma as per path report.  Next 7 years per GI -Prostate cancer screening: No symptoms.  Check PSA - labs:   CMP FLP CBC A1c PSA -Lifestyle: Continue to do well with diet, active at work, active at home doing yard work.  Able to keep his weight stable.

## 2024-03-09 NOTE — Progress Notes (Signed)
 Subjective:    Patient ID: Bradley Jefferson, male    DOB: Sep 11, 1971, 53 y.o.   MRN: [id number]  DOS:  03/09/2024 Type of visit - description: CPX  Here for CPX. Chronic medical problems addressed.  Denies chest pain or difficulty breathing. Anticoagulated, denies blood in the urine or in the stools. No lower extremity paresthesias.  Wt Readings from Last 3 Encounters:  03/09/24 196 lb (88.9 kg)  12/23/23 194 lb (88 kg)  10/10/23 192 lb 2 oz (87.1 kg)    Review of Systems  A 14 point review of systems is negative    Past Medical History:  Diagnosis Date   Annual physical exam 07/22/2011   Diabetes mellitus 03/2009   DM II (diabetes mellitus, type II), controlled (HCC) 03/07/2009   Patient requested to discontinue Actos and metformin  and switch to Byetta  08-2010 Metformin  restarted 12-2010 but self discontinued due to GI side effects Feet exam normal 11-15 Eye care discussed 10-14    Dyslipidemia 12/27/2019   Dyspnea on exertion 12/27/2019   Eczema 05/23/2022   ETD (eustachian tube dysfunction) 01/21/2012   EXTERNAL HEMORRHOIDS 10/12/2010   Qualifier: Diagnosis of  By: Neomi Banks MD, Anitra Ket.    External hemorrhoids 10/12/2010   Qualifier: Diagnosis of   By: Neomi Banks MD, Anitra Ket     IMO SNOMED Dx Update Oct 2024     GERD 03/07/2009   Qualifier: Diagnosis of  By: Joen Muskrat     GERD (gastroesophageal reflux disease)    Hypertension 07/05/2018   Insomnia 02/23/2020   Low back pain    sees Chiro-herman   Palpitations 12/27/2019   Paroxysmal atrial fibrillation (HCC) 04/24/2020   PCP NOTES >>>>> 08/15/2015    Past Surgical History:  Procedure Laterality Date   abscess anal gland     s/p I&D   Social History   Socioeconomic History   Marital status: Married    Spouse name: Not on file   Number of children: 0   Years of education: Not on file   Highest education level: Bachelor's degree (e.g., BA, AB, BS)  Occupational History   Occupation: manage a car parts department     Employer: [employer]  Tobacco Use   Smoking status: Former    Current packs/day: 0.00    Types: Cigarettes    Quit date: 07/22/1999    Years since quitting: 24.6   Smokeless tobacco: Never  Substance and Sexual Activity   Alcohol use: Yes    Comment: socially    Drug use: No   Sexual activity: Not on file  Other Topics Concern   Not on file  Social History Narrative   Lives w/ wife, she has h/o breast ca, also dx w/ H. Lymphoma ~ 03-2016, s/p chemo    Father and father in law live w/ them   Social Drivers of Health   Financial Resource Strain: Low Risk  (10/06/2023)   Overall Financial Resource Strain (CARDIA)    Difficulty of Paying Living Expenses: Not hard at all  Food Insecurity: No Food Insecurity (10/06/2023)   Hunger Vital Sign    Worried About Running Out of Food in the Last Year: Never true    Ran Out of Food in the Last Year: Never true  Transportation Needs: No Transportation Needs (10/06/2023)   PRAPARE - Administrator, Civil Service (Medical): No    Lack of Transportation (Non-Medical): No  Physical Activity: Insufficiently Active (10/06/2023)   Exercise Vital Sign  Days of Exercise per Week: 4 days    Minutes of Exercise per Session: 30 min  Stress: No Stress Concern Present (10/06/2023)   [employer] of Occupational Health - Occupational Stress Questionnaire    Feeling of Stress : Only a little  Social Connections: Moderately Isolated (10/06/2023)   Social Connection and Isolation Panel [NHANES]    Frequency of Communication with Friends and Family: More than three times a week    Frequency of Social Gatherings with Friends and Family: Once a week    Attends Religious Services: Never    Database administrator or Organizations: No    Attends Engineer, structural: Not on file    Marital Status: Married  Catering manager Violence: Not on file     Current Outpatient Medications  Medication Instructions   ALPRAZolam   (XANAX ) 0.5 MG tablet TAKE 1 TO 2 TABLETS (0.5-1 MG TOTAL) BY MOUTH AT BEDTIME AS NEEDED FOR SLEEP.   cetirizine (ZYRTEC) 10 mg, Daily   Continuous Glucose Sensor (FREESTYLE LIBRE 3 PLUS SENSOR) MISC Change sensor every 15 days.   diltiazem  (CARDIZEM  CD) 120 MG 24 hr capsule Oral, Daily   ezetimibe  (ZETIA ) 10 mg, Oral, Daily   flecainide  (TAMBOCOR ) 50 mg, Oral, 2 times daily, 1rst attempt, patient needs and appt for additional refills   fluticasone  (FLONASE ) 50 MCG/ACT nasal spray 1 spray, As needed   metFORMIN  (GLUCOPHAGE -XR) 500 mg, Oral, 2 times daily with meals   pantoprazole  (PROTONIX ) 40 mg, Oral, Daily before breakfast   rivaroxaban  (XARELTO ) 20 mg, Oral, Daily with supper   telmisartan  (MICARDIS ) 80 mg, Oral, Daily       Objective:   Physical Exam BP 118/68   Pulse 68   Temp 98.1 F (36.7 C) (Oral)   Resp 16   Ht 5\' 11"  (1.803 m)   Wt 196 lb (88.9 kg)   SpO2 97%   BMI 27.34 kg/m  General: Well developed, NAD, BMI noted Neck: No  thyromegaly  HEENT:  Normocephalic . Face symmetric, atraumatic Lungs:  CTA B Normal respiratory effort, no intercostal retractions, no accessory muscle use. Heart: RRR,  no murmur.  Abdomen:  Not distended, soft, non-tender. No rebound or rigidity.   DM foot exam: No edema, normal pedal pulses, pinprick examination normal Skin: Exposed areas without rash. Not pale. Not jaundice Neurologic:  alert & oriented X3.  Speech normal, gait appropriate for age and unassisted Strength symmetric and appropriate for age.  Psych: Cognition and judgment appear intact.  Cooperative with normal attention span and concentration.  Behavior appropriate. No anxious or depressed appearing.     Assessment   Assessment   DM 2010- can tolerate only metformin  ER; Tradjenta  $  HTN dx 2-17 High cholesterol. (Atorva-Crestor  aches) Atrial fibrillation, W/U initially neg, subsequently A. fib noted on his wrist watch, DX 04-2020 Insomnia : xanax  rarely   GERD Occasional low back pain, sees a chiropractor. Occasional increase LFTs: Hepatitis B and C negative 12-2016 Ec zema: Occasionally uses triamcinolone .    PLAN Here for CPX -Td 2018 - PNM 23:  2014 and 2023; Prevnar 2016 - vax I rec: shingrix , covid booster, flu shot q fall - (+) FH CV Dz: plan is control CV RF  - CCS: mother + polyps at age 63, Cscope at [facility] 05/2020, +polyps per pt, x2 tubular adenoma as per path report.  Next 7 years per GI -Prostate cancer screening: No symptoms.  Check PSA - labs:   CMP FLP CBC A1c  PSA -Lifestyle: Continue to do well with diet, active at work, active at home doing yard work. Able to keep his weight stable.  Other issues addressed DM: No recent ambulatory CBGs but plans to get a freestyle libre.  On metformin .  Check A1c. Feet exam negative. HTN: On Cardizem , Micardis , with his CLL disorder and other providers BPs are very good. High cholesterol: Atorvastatin  and Crestor  intolerant.  On Zetia , last LDL okay, recheck today. Insomnia: Uses occasional Xanax  Paroxysmal A-fib.  OV cardiology 12/23/2023, Was in sinus rhythm,was  rec to continue flecainide , Xarelto  RTC 6 months

## 2024-03-09 NOTE — Assessment & Plan Note (Signed)
 Here for CPX   Other issues addressed DM: No recent ambulatory CBGs but plans to get a freestyle libre.  On metformin .  Check A1c. Feet exam negative. HTN: On Cardizem , Micardis , with his CLL disorder and other providers BPs are very good. High cholesterol: Atorvastatin  and Crestor  intolerant.  On Zetia , last LDL okay, recheck today. Insomnia: Uses occasional Xanax  Paroxysmal A-fib.  OV cardiology 12/23/2023, Was in sinus rhythm,was  rec to continue flecainide , Xarelto  RTC 6 months

## 2024-03-09 NOTE — Patient Instructions (Signed)
 Vaccines to consider: Flu shot every fall Shingrix COVID booster  Diabetes: You can check your sugars at different times  - early in AM fasting  ( blood sugar goal 70-130) - 2 hours after a meal (blood sugar goal less than 180)       GO TO THE LAB :  Get the blood work   Your results will be posted on MyChart with my comments  Next office visit for a checkup in 6 months.  Sooner if you need, depending on results. Please make an appointment before you leave today

## 2024-03-10 ENCOUNTER — Encounter: Payer: Self-pay | Admitting: Internal Medicine

## 2024-03-11 ENCOUNTER — Ambulatory Visit: Payer: Self-pay | Admitting: Internal Medicine

## 2024-03-11 MED ORDER — METFORMIN HCL ER 500 MG PO TB24
1000.0000 mg | ORAL_TABLET | Freq: Two times a day (BID) | ORAL | 1 refills | Status: DC
Start: 2024-03-11 — End: 2024-08-19

## 2024-03-11 NOTE — Addendum Note (Signed)
 Addended by: Diyana Starrett D on: 03/11/2024 10:44 AM   Modules accepted: Orders

## 2024-04-28 ENCOUNTER — Encounter: Payer: Self-pay | Admitting: Internal Medicine

## 2024-04-28 MED ORDER — NYSTATIN-TRIAMCINOLONE 100000-0.1 UNIT/GM-% EX OINT: 1.0000 | TOPICAL_OINTMENT | Freq: Two times a day (BID) | CUTANEOUS | 0 refills | Status: AC

## 2024-05-23 ENCOUNTER — Other Ambulatory Visit: Payer: Self-pay | Admitting: Internal Medicine

## 2024-06-19 ENCOUNTER — Other Ambulatory Visit: Payer: Self-pay | Admitting: Internal Medicine

## 2024-07-01 ENCOUNTER — Encounter: Payer: Self-pay | Admitting: Internal Medicine

## 2024-07-01 MED ORDER — BENZONATATE 200 MG PO CAPS: 200.0000 mg | ORAL_CAPSULE | Freq: Three times a day (TID) | ORAL | 0 refills | Status: AC | PRN

## 2024-07-01 MED ORDER — HYDROCODONE BIT-HOMATROP MBR 5-1.5 MG/5ML PO SOLN: 5.0000 mL | Freq: Every evening | ORAL | 0 refills | Status: AC | PRN

## 2024-07-01 NOTE — Telephone Encounter (Signed)
PDMP reviewed today.

## 2024-07-10 ENCOUNTER — Other Ambulatory Visit: Payer: Self-pay | Admitting: Internal Medicine

## 2024-07-24 ENCOUNTER — Other Ambulatory Visit: Payer: Self-pay | Admitting: Cardiology

## 2024-08-19 ENCOUNTER — Other Ambulatory Visit: Payer: Self-pay | Admitting: Internal Medicine

## 2024-08-24 ENCOUNTER — Encounter: Payer: Self-pay | Admitting: Cardiology

## 2024-08-24 ENCOUNTER — Other Ambulatory Visit: Payer: Self-pay

## 2024-08-24 MED ORDER — FLECAINIDE ACETATE 50 MG PO TABS: 50.0000 mg | ORAL_TABLET | Freq: Two times a day (BID) | ORAL | 0 refills | Status: AC

## 2024-08-24 NOTE — Telephone Encounter (Signed)
 Flecainide  50mg  BID #180 - 0 ref.

## 2024-09-08 ENCOUNTER — Ambulatory Visit: Admitting: Internal Medicine

## 2024-11-12 ENCOUNTER — Other Ambulatory Visit: Payer: Self-pay | Admitting: Internal Medicine

## 2024-11-19 ENCOUNTER — Ambulatory Visit: Admitting: Internal Medicine
